# Patient Record
Sex: Female | Born: 1935
Health system: Southern US, Community
[De-identification: ages and names within clinical notes are randomized; demographics above are authoritative.]

## PROBLEM LIST (undated history)

## (undated) DIAGNOSIS — E785 Hyperlipidemia, unspecified: Secondary | ICD-10-CM

## (undated) DIAGNOSIS — IMO0002 Reserved for concepts with insufficient information to code with codable children: Secondary | ICD-10-CM

## (undated) DIAGNOSIS — I739 Peripheral vascular disease, unspecified: Secondary | ICD-10-CM

## (undated) DIAGNOSIS — I2510 Atherosclerotic heart disease of native coronary artery without angina pectoris: Secondary | ICD-10-CM

## (undated) DIAGNOSIS — N183 Chronic kidney disease, stage 3 unspecified: Secondary | ICD-10-CM

## (undated) DIAGNOSIS — I779 Disorder of arteries and arterioles, unspecified: Secondary | ICD-10-CM

## (undated) DIAGNOSIS — I10 Essential (primary) hypertension: Secondary | ICD-10-CM

## (undated) DIAGNOSIS — C689 Malignant neoplasm of urinary organ, unspecified: Secondary | ICD-10-CM

## (undated) DIAGNOSIS — N8110 Cystocele, unspecified: Secondary | ICD-10-CM

## (undated) HISTORY — PX: APPENDECTOMY: SHX54

## (undated) HISTORY — DX: Reserved for concepts with insufficient information to code with codable children: IMO0002

## (undated) HISTORY — DX: Cystocele, unspecified: N81.10

## (undated) HISTORY — PX: NEPHRECTOMY: SHX65

## (undated) HISTORY — DX: Hyperlipidemia, unspecified: E78.5

## (undated) HISTORY — PX: CORONARY ANGIOPLASTY WITH STENT PLACEMENT: SHX49

## (undated) HISTORY — DX: Essential (primary) hypertension: I10

## (undated) HISTORY — DX: Atherosclerotic heart disease of native coronary artery without angina pectoris: I25.10

---

## 1998-10-26 ENCOUNTER — Ambulatory Visit (HOSPITAL_BASED_OUTPATIENT_CLINIC_OR_DEPARTMENT_OTHER): Admission: RE | Admit: 1998-10-26 | Discharge: 1998-10-26 | Payer: Self-pay | Admitting: *Deleted

## 1999-05-10 ENCOUNTER — Encounter: Admission: RE | Admit: 1999-05-10 | Discharge: 1999-05-10 | Payer: Self-pay | Admitting: Family Medicine

## 2000-05-29 ENCOUNTER — Encounter: Admission: RE | Admit: 2000-05-29 | Discharge: 2000-05-29 | Payer: Self-pay | Admitting: *Deleted

## 2000-05-29 ENCOUNTER — Encounter: Payer: Self-pay | Admitting: *Deleted

## 2000-08-20 ENCOUNTER — Other Ambulatory Visit: Admission: RE | Admit: 2000-08-20 | Discharge: 2000-08-20 | Payer: Self-pay | Admitting: Family Medicine

## 2001-07-22 ENCOUNTER — Encounter: Admission: RE | Admit: 2001-07-22 | Discharge: 2001-07-22 | Payer: Self-pay | Admitting: *Deleted

## 2001-07-22 ENCOUNTER — Encounter: Payer: Self-pay | Admitting: Family Medicine

## 2001-12-03 ENCOUNTER — Encounter: Payer: Self-pay | Admitting: Emergency Medicine

## 2001-12-03 ENCOUNTER — Inpatient Hospital Stay (HOSPITAL_COMMUNITY): Admission: EM | Admit: 2001-12-03 | Discharge: 2001-12-06 | Payer: Self-pay | Admitting: Emergency Medicine

## 2001-12-05 ENCOUNTER — Encounter: Payer: Self-pay | Admitting: Cardiology

## 2002-01-20 ENCOUNTER — Inpatient Hospital Stay (HOSPITAL_COMMUNITY): Admission: EM | Admit: 2002-01-20 | Discharge: 2002-01-21 | Payer: Self-pay | Admitting: Emergency Medicine

## 2002-01-20 ENCOUNTER — Encounter: Payer: Self-pay | Admitting: Emergency Medicine

## 2002-02-25 ENCOUNTER — Encounter: Payer: Self-pay | Admitting: Emergency Medicine

## 2002-02-25 ENCOUNTER — Emergency Department (HOSPITAL_COMMUNITY): Admission: EM | Admit: 2002-02-25 | Discharge: 2002-02-25 | Payer: Self-pay | Admitting: Emergency Medicine

## 2002-08-05 ENCOUNTER — Encounter: Payer: Self-pay | Admitting: Family Medicine

## 2002-08-05 ENCOUNTER — Encounter: Admission: RE | Admit: 2002-08-05 | Discharge: 2002-08-05 | Payer: Self-pay | Admitting: Family Medicine

## 2002-12-29 ENCOUNTER — Encounter: Payer: Self-pay | Admitting: Internal Medicine

## 2002-12-29 ENCOUNTER — Inpatient Hospital Stay (HOSPITAL_COMMUNITY): Admission: EM | Admit: 2002-12-29 | Discharge: 2002-12-30 | Payer: Self-pay | Admitting: Emergency Medicine

## 2002-12-29 ENCOUNTER — Encounter: Payer: Self-pay | Admitting: Emergency Medicine

## 2002-12-30 ENCOUNTER — Encounter: Payer: Self-pay | Admitting: Internal Medicine

## 2003-09-28 ENCOUNTER — Encounter: Admission: RE | Admit: 2003-09-28 | Discharge: 2003-09-28 | Payer: Self-pay | Admitting: Family Medicine

## 2004-02-10 ENCOUNTER — Ambulatory Visit: Payer: Self-pay | Admitting: *Deleted

## 2004-02-29 ENCOUNTER — Ambulatory Visit: Payer: Self-pay | Admitting: *Deleted

## 2004-03-31 ENCOUNTER — Ambulatory Visit: Payer: Self-pay | Admitting: *Deleted

## 2004-07-27 ENCOUNTER — Ambulatory Visit: Payer: Self-pay | Admitting: *Deleted

## 2004-08-08 ENCOUNTER — Ambulatory Visit: Payer: Self-pay | Admitting: *Deleted

## 2004-10-12 ENCOUNTER — Encounter: Admission: RE | Admit: 2004-10-12 | Discharge: 2004-10-12 | Payer: Self-pay | Admitting: Family Medicine

## 2004-11-29 ENCOUNTER — Ambulatory Visit: Payer: Self-pay | Admitting: *Deleted

## 2004-12-16 ENCOUNTER — Ambulatory Visit: Payer: Self-pay | Admitting: *Deleted

## 2004-12-16 ENCOUNTER — Ambulatory Visit: Payer: Self-pay

## 2005-04-06 ENCOUNTER — Ambulatory Visit: Payer: Self-pay | Admitting: *Deleted

## 2005-08-02 ENCOUNTER — Ambulatory Visit (HOSPITAL_COMMUNITY): Admission: RE | Admit: 2005-08-02 | Discharge: 2005-08-02 | Payer: Self-pay | Admitting: Internal Medicine

## 2005-08-08 ENCOUNTER — Ambulatory Visit: Payer: Self-pay | Admitting: *Deleted

## 2005-08-09 ENCOUNTER — Ambulatory Visit (HOSPITAL_COMMUNITY): Admission: RE | Admit: 2005-08-09 | Discharge: 2005-08-09 | Payer: Self-pay | Admitting: Urology

## 2005-08-11 ENCOUNTER — Ambulatory Visit: Payer: Self-pay | Admitting: Cardiology

## 2005-08-17 ENCOUNTER — Inpatient Hospital Stay (HOSPITAL_COMMUNITY): Admission: RE | Admit: 2005-08-17 | Discharge: 2005-08-24 | Payer: Self-pay | Admitting: Urology

## 2005-08-17 ENCOUNTER — Encounter (INDEPENDENT_AMBULATORY_CARE_PROVIDER_SITE_OTHER): Payer: Self-pay | Admitting: Specialist

## 2005-09-11 ENCOUNTER — Encounter (HOSPITAL_COMMUNITY): Admission: RE | Admit: 2005-09-11 | Discharge: 2005-10-11 | Payer: Self-pay | Admitting: Oncology

## 2005-09-11 ENCOUNTER — Ambulatory Visit (HOSPITAL_COMMUNITY): Payer: Self-pay | Admitting: Oncology

## 2005-09-11 ENCOUNTER — Encounter: Admission: RE | Admit: 2005-09-11 | Discharge: 2005-09-11 | Payer: Self-pay | Admitting: Oncology

## 2005-10-19 ENCOUNTER — Encounter: Admission: RE | Admit: 2005-10-19 | Discharge: 2005-10-19 | Payer: Self-pay | Admitting: Internal Medicine

## 2005-11-16 ENCOUNTER — Ambulatory Visit (HOSPITAL_COMMUNITY): Admission: RE | Admit: 2005-11-16 | Discharge: 2005-11-16 | Payer: Self-pay | Admitting: Urology

## 2005-12-27 ENCOUNTER — Ambulatory Visit: Payer: Self-pay

## 2005-12-27 ENCOUNTER — Ambulatory Visit: Payer: Self-pay | Admitting: *Deleted

## 2006-01-18 ENCOUNTER — Ambulatory Visit: Payer: Self-pay | Admitting: *Deleted

## 2006-02-12 ENCOUNTER — Ambulatory Visit (HOSPITAL_COMMUNITY): Admission: RE | Admit: 2006-02-12 | Discharge: 2006-02-12 | Payer: Self-pay | Admitting: Urology

## 2006-03-14 ENCOUNTER — Encounter (HOSPITAL_COMMUNITY): Admission: RE | Admit: 2006-03-14 | Discharge: 2006-04-09 | Payer: Self-pay | Admitting: Oncology

## 2006-03-14 ENCOUNTER — Ambulatory Visit (HOSPITAL_COMMUNITY): Payer: Self-pay | Admitting: Oncology

## 2006-05-21 ENCOUNTER — Ambulatory Visit (HOSPITAL_COMMUNITY): Admission: RE | Admit: 2006-05-21 | Discharge: 2006-05-21 | Payer: Self-pay | Admitting: Urology

## 2006-06-12 ENCOUNTER — Ambulatory Visit: Payer: Self-pay | Admitting: *Deleted

## 2006-09-17 ENCOUNTER — Ambulatory Visit (HOSPITAL_COMMUNITY): Payer: Self-pay | Admitting: Oncology

## 2006-09-17 ENCOUNTER — Encounter (HOSPITAL_COMMUNITY): Admission: RE | Admit: 2006-09-17 | Discharge: 2006-10-17 | Payer: Self-pay | Admitting: Oncology

## 2006-10-15 ENCOUNTER — Ambulatory Visit: Payer: Self-pay | Admitting: Internal Medicine

## 2006-10-16 ENCOUNTER — Ambulatory Visit: Payer: Self-pay

## 2006-10-22 ENCOUNTER — Encounter: Admission: RE | Admit: 2006-10-22 | Discharge: 2006-10-22 | Payer: Self-pay | Admitting: Internal Medicine

## 2006-11-20 ENCOUNTER — Ambulatory Visit (HOSPITAL_COMMUNITY): Admission: RE | Admit: 2006-11-20 | Discharge: 2006-11-20 | Payer: Self-pay | Admitting: Urology

## 2006-12-28 ENCOUNTER — Ambulatory Visit (HOSPITAL_COMMUNITY): Admission: RE | Admit: 2006-12-28 | Discharge: 2006-12-28 | Payer: Self-pay | Admitting: Family Medicine

## 2007-03-11 ENCOUNTER — Encounter (HOSPITAL_COMMUNITY): Admission: RE | Admit: 2007-03-11 | Discharge: 2007-04-10 | Payer: Self-pay | Admitting: Oncology

## 2007-03-18 ENCOUNTER — Ambulatory Visit (HOSPITAL_COMMUNITY): Payer: Self-pay | Admitting: Oncology

## 2007-04-01 ENCOUNTER — Ambulatory Visit: Payer: Self-pay | Admitting: Internal Medicine

## 2007-05-30 ENCOUNTER — Ambulatory Visit (HOSPITAL_COMMUNITY): Admission: RE | Admit: 2007-05-30 | Discharge: 2007-05-30 | Payer: Self-pay | Admitting: Urology

## 2007-11-04 ENCOUNTER — Encounter: Admission: RE | Admit: 2007-11-04 | Discharge: 2007-11-04 | Payer: Self-pay | Admitting: Internal Medicine

## 2007-11-18 ENCOUNTER — Encounter (HOSPITAL_COMMUNITY): Admission: RE | Admit: 2007-11-18 | Discharge: 2007-12-18 | Payer: Self-pay | Admitting: Oncology

## 2007-11-18 ENCOUNTER — Ambulatory Visit (HOSPITAL_COMMUNITY): Payer: Self-pay | Admitting: Oncology

## 2007-11-25 ENCOUNTER — Ambulatory Visit (HOSPITAL_COMMUNITY): Admission: RE | Admit: 2007-11-25 | Discharge: 2007-11-25 | Payer: Self-pay | Admitting: Urology

## 2007-12-26 ENCOUNTER — Ambulatory Visit: Payer: Self-pay | Admitting: Internal Medicine

## 2008-01-02 ENCOUNTER — Encounter: Payer: Self-pay | Admitting: Orthopedic Surgery

## 2008-01-02 ENCOUNTER — Emergency Department (HOSPITAL_COMMUNITY): Admission: EM | Admit: 2008-01-02 | Discharge: 2008-01-02 | Payer: Self-pay | Admitting: Emergency Medicine

## 2008-01-06 ENCOUNTER — Ambulatory Visit: Payer: Self-pay | Admitting: Orthopedic Surgery

## 2008-01-06 DIAGNOSIS — S8263XA Displaced fracture of lateral malleolus of unspecified fibula, initial encounter for closed fracture: Secondary | ICD-10-CM

## 2008-01-20 ENCOUNTER — Ambulatory Visit: Payer: Self-pay | Admitting: Orthopedic Surgery

## 2008-02-19 ENCOUNTER — Ambulatory Visit: Payer: Self-pay | Admitting: Orthopedic Surgery

## 2008-03-02 ENCOUNTER — Ambulatory Visit: Payer: Self-pay | Admitting: Internal Medicine

## 2008-03-02 LAB — CONVERTED CEMR LAB
Basophils Relative: 0.8 % (ref 0.0–3.0)
Eosinophils Absolute: 0.2 10*3/uL (ref 0.0–0.7)
Eosinophils Relative: 2.3 % (ref 0.0–5.0)
HCT: 35.9 % — ABNORMAL LOW (ref 36.0–46.0)
MCHC: 33.7 g/dL (ref 30.0–36.0)
MCV: 99.5 fL (ref 78.0–100.0)
Monocytes Absolute: 1.1 10*3/uL — ABNORMAL HIGH (ref 0.1–1.0)
Platelets: 162 10*3/uL (ref 150–400)
RBC: 3.61 M/uL — ABNORMAL LOW (ref 3.87–5.11)
WBC: 10.4 10*3/uL (ref 4.5–10.5)

## 2008-03-10 ENCOUNTER — Ambulatory Visit (HOSPITAL_COMMUNITY): Admission: RE | Admit: 2008-03-10 | Discharge: 2008-03-10 | Payer: Self-pay | Admitting: Family Medicine

## 2008-03-21 ENCOUNTER — Emergency Department (HOSPITAL_COMMUNITY): Admission: EM | Admit: 2008-03-21 | Discharge: 2008-03-21 | Payer: Self-pay | Admitting: Emergency Medicine

## 2008-05-25 ENCOUNTER — Ambulatory Visit (HOSPITAL_COMMUNITY): Payer: Self-pay | Admitting: Oncology

## 2008-05-27 ENCOUNTER — Ambulatory Visit (HOSPITAL_COMMUNITY): Admission: RE | Admit: 2008-05-27 | Discharge: 2008-05-27 | Payer: Self-pay | Admitting: Urology

## 2008-06-03 ENCOUNTER — Ambulatory Visit: Payer: Self-pay | Admitting: Cardiology

## 2008-10-02 ENCOUNTER — Ambulatory Visit: Payer: Self-pay | Admitting: Internal Medicine

## 2008-10-02 DIAGNOSIS — I10 Essential (primary) hypertension: Secondary | ICD-10-CM

## 2008-10-02 DIAGNOSIS — I2510 Atherosclerotic heart disease of native coronary artery without angina pectoris: Secondary | ICD-10-CM

## 2008-10-02 DIAGNOSIS — E785 Hyperlipidemia, unspecified: Secondary | ICD-10-CM

## 2008-11-10 ENCOUNTER — Encounter: Admission: RE | Admit: 2008-11-10 | Discharge: 2008-11-10 | Payer: Self-pay | Admitting: Family Medicine

## 2008-11-25 ENCOUNTER — Encounter: Payer: Self-pay | Admitting: Internal Medicine

## 2008-11-25 ENCOUNTER — Ambulatory Visit (HOSPITAL_COMMUNITY): Admission: RE | Admit: 2008-11-25 | Discharge: 2008-11-25 | Payer: Self-pay | Admitting: Urology

## 2008-11-30 ENCOUNTER — Ambulatory Visit (HOSPITAL_COMMUNITY): Payer: Self-pay | Admitting: Oncology

## 2008-11-30 ENCOUNTER — Encounter (HOSPITAL_COMMUNITY): Admission: RE | Admit: 2008-11-30 | Discharge: 2008-12-30 | Payer: Self-pay | Admitting: Oncology

## 2008-11-30 ENCOUNTER — Encounter: Payer: Self-pay | Admitting: Internal Medicine

## 2008-12-01 ENCOUNTER — Encounter: Payer: Self-pay | Admitting: Internal Medicine

## 2008-12-04 LAB — CONVERTED CEMR LAB
AST: 24 units/L (ref 0–37)
Glucose, Bld: 100 mg/dL — ABNORMAL HIGH (ref 70–99)
LDL Cholesterol: 51 mg/dL (ref 0–99)

## 2008-12-11 ENCOUNTER — Encounter: Payer: Self-pay | Admitting: Internal Medicine

## 2008-12-21 ENCOUNTER — Telehealth: Payer: Self-pay | Admitting: Internal Medicine

## 2009-04-15 ENCOUNTER — Ambulatory Visit: Payer: Self-pay | Admitting: Internal Medicine

## 2009-06-08 ENCOUNTER — Encounter: Payer: Self-pay | Admitting: Internal Medicine

## 2009-06-08 DIAGNOSIS — I779 Disorder of arteries and arterioles, unspecified: Secondary | ICD-10-CM | POA: Insufficient documentation

## 2009-06-08 DIAGNOSIS — I739 Peripheral vascular disease, unspecified: Secondary | ICD-10-CM

## 2009-06-09 ENCOUNTER — Encounter: Payer: Self-pay | Admitting: Internal Medicine

## 2009-06-09 ENCOUNTER — Ambulatory Visit: Payer: Self-pay

## 2009-06-11 ENCOUNTER — Encounter: Payer: Self-pay | Admitting: Internal Medicine

## 2009-06-15 ENCOUNTER — Telehealth: Payer: Self-pay | Admitting: Internal Medicine

## 2009-06-16 ENCOUNTER — Ambulatory Visit: Payer: Self-pay | Admitting: Cardiovascular Disease

## 2009-06-21 ENCOUNTER — Telehealth (INDEPENDENT_AMBULATORY_CARE_PROVIDER_SITE_OTHER): Payer: Self-pay | Admitting: *Deleted

## 2009-06-22 ENCOUNTER — Encounter (HOSPITAL_COMMUNITY): Admission: RE | Admit: 2009-06-22 | Discharge: 2009-08-11 | Payer: Self-pay | Admitting: Internal Medicine

## 2009-06-22 ENCOUNTER — Ambulatory Visit: Payer: Self-pay | Admitting: Cardiology

## 2009-06-22 ENCOUNTER — Ambulatory Visit: Payer: Self-pay

## 2009-07-08 ENCOUNTER — Ambulatory Visit: Payer: Self-pay | Admitting: Internal Medicine

## 2009-08-18 ENCOUNTER — Ambulatory Visit (HOSPITAL_COMMUNITY): Payer: Self-pay | Admitting: Internal Medicine

## 2009-08-18 ENCOUNTER — Encounter (HOSPITAL_COMMUNITY): Admission: RE | Admit: 2009-08-18 | Discharge: 2009-09-17 | Payer: Self-pay | Admitting: Oncology

## 2009-08-18 ENCOUNTER — Encounter: Payer: Self-pay | Admitting: Internal Medicine

## 2009-11-11 ENCOUNTER — Encounter: Admission: RE | Admit: 2009-11-11 | Discharge: 2009-11-11 | Payer: Self-pay | Admitting: Family Medicine

## 2009-12-31 ENCOUNTER — Ambulatory Visit: Payer: Self-pay | Admitting: Internal Medicine

## 2010-01-21 ENCOUNTER — Encounter: Payer: Self-pay | Admitting: Internal Medicine

## 2010-02-18 ENCOUNTER — Encounter (HOSPITAL_COMMUNITY)
Admission: RE | Admit: 2010-02-18 | Discharge: 2010-03-20 | Payer: Self-pay | Source: Home / Self Care | Attending: Oncology | Admitting: Oncology

## 2010-05-10 NOTE — Progress Notes (Signed)
Summary: chestpain   Phone Note Call from Patient Call back at Home Phone [phone]   Caller: Daughter-althea cox  Reason for Call: Talk to Nurse Complaint: Chest Pain Details for Reason: Per pt daughter calling, c/o chestpain, ems was at the home. ekg was normal, b/p was high. ems advise pt to call office -set up appt to see pr.  Initial call taken by: Neil Crouch,  June 15, 2009 2:21 PM  Follow-up for Phone Call        spoke with pt dtr, pt had an episode today of discomfort in her chest and trouble swallowing. she called EMS and they did an EKG and everything was fine. her bp was 180/90 and then 158/90. she is having no problems at present. pt thinks it was due to her eating a prok chop and indigestion. this discomfort is not simular to the tightness she had prior to stenting. pt denies SOB. they request the pt be seen Fredia Beets, RN  June 15, 2009 2:53 PM  Follow-up by: Fredia Beets, RN,  June 15, 2009 2:47 PM  Additional Follow-up for Phone Call Additional follow up Details #1::        pt will see dr Johnsie Cancel (d[place]) tomorrow Fredia Beets, RN  June 15, 2009 3:17 PM

## 2010-05-10 NOTE — Assessment & Plan Note (Signed)
Summary: post myoview per last office vist on 06/16/2009/lg  Medications Added LOTREL 5-20 MG CAPS (AMLODIPINE BESY-BENAZEPRIL HCL) 1 cap every day( generic is ok)      Allergies Added: NKDA  Visit Type:  Follow-up Primary Provider:  Everette Rank  CC:  Post- Myoview.  History of Present Illness: Patient is an 77 year old with a history of CAD (s/p PTCA/Stent to the LAD and PTCA to ramus in 2003).  I last saw her in January of this year. In the interval she had an episode of chest pain after eatting pork chops.  She thinks the pieced got stuck.  It passed  but was seen by EMS. She was set up in cardiology and was set up for a myoview.  this showed no ischemia. Since the spell, the patient has had no recurrence.  Breathing is ok.  Current Medications (verified): 1)  Amlodipine Besy-Benazepril Hcl 5-10 Mg Caps (Amlodipine Besy-Benazepril Hcl) .Marland Kitchen.. 1 Tab By Mouth Once Daily 2)  Metoprolol Succinate 50 Mg Xr24h-Tab (Metoprolol Succinate) .... Take One Tablet By Mouth Daily 3)  Hydrochlorothiazide 25 Mg Tabs (Hydrochlorothiazide) .... Tab By Mouth Once Daily 4)  Adult Aspirin Ec Low Strength 81 Mg Tbec (Aspirin) .... Tab By Mouth Once Daily 5)  Allopurinol 100 Mg Tabs (Allopurinol) .Marland Kitchen.. 1 Tab By Mouth Once Daily 6)  Lipitor 10 Mg Tabs (Atorvastatin Calcium) .Marland Kitchen.. 1 Tab By Mouth Once Daily 7)  Evista 60 Mg Tabs (Raloxifene Hcl) .Marland Kitchen.. 1 Tab By Mouth Once Daily 8)  Oystercal-D 500-400 Mg-Unit Tabs (Calcium-Vitamin D) .Marland Kitchen.. 1 Tab By Mouth Once Daily  Allergies (verified): No Known Drug Allergies  Past History:  Past medical, surgical, family and social histories (including risk factors) reviewed, and no changes noted (except as noted below).  Past Medical History: Reviewed history from 10/02/2008 and no changes required. htn gout dyslipidemia CAD  Past Surgical History: Reviewed history from 10/02/2008 and no changes required.  kidney removed  Family History: Reviewed history from  01/06/2008 and no changes required. FH of Cancer:  Family History Coronary Heart Disease female < 48  Social History: Reviewed history from 06/16/2009 and no changes required. Patient is widowed.  Two daughters care for her Still ambulatory and does all ADL's  Vital Signs:  Patient profile:   77 year old female Height:      61 inches Weight:      128 pounds Pulse rate:   68 / minute Pulse rhythm:   regular Resp:     18 per minute BP sitting:   158 / 72  (left arm) Cuff size:   regular  Vitals Entered By: Sidney Ace (July 08, 2009 3:14 PM)  Physical Exam  Additional Exam:  Patient is in NAD at rest HEENT:  Normocephalic, atraumatic. EOMI, PERRLA.  Neck: JVP is normal. No thyromegaly. No bruits.  Lungs: clear to auscultation. No rales no wheezes.  Heart: Regular rate and rhythm. Normal S1, S2. No S3.   No significant murmurs. PMI not displaced.  Abdomen:  Supple, nontender. Normal bowel sounds. No masses. No hepatomegaly.  Extremities:   Good distal pulses throughout. No lower extremity edema.  Musculoskeletal :moving all extremities.  Neuro:   alert and oriented x3.    Impression & Recommendations:  Problem # 1:  CAD, NATIVE VESSEL (ICD-414.01) Recent bout of chest pain.  Myoview negative.  Pain seemed more fro m food being stuck.  will need to be followed.  Problem # 2:  CAROTID ARTERY STENOSIS (ICD-433.10) Moderate  Continue meds  Periodic f/u  Problem # 3:  HYPERTENSION, BENIGN (ICD-401.1) Needs tighter control.  Increase Lotrel to 5/20.  F/u with Dr. Emilee Hero.  Problem # 4:  HYPERLIPIDEMIA-MIXED (P102836.4) Keep on current regimen.  Patient Instructions: 1)  Your physician wants you to follow-up in: 6 months  You will receive a reminder letter in the mail two months in advance. If you don't receive a letter, please call our office to schedule the follow-up appointment. Prescriptions: LOTREL 5-20 MG CAPS (AMLODIPINE BESY-BENAZEPRIL HCL) 1 cap every day(  generic is ok)  #30 x 6   Entered by:   Francetta Found, RN, BSN   Authorized by:   Annye Rusk, MD, [facility]   Signed by:   Francetta Found, RN, BSN on 07/08/2009   Method used:   Electronically to        [facility] (retail)       [address]       [place], [place]  [address]       Ph: [phone]       Fax: [phone]   RxID:   [id number]

## 2010-05-10 NOTE — Progress Notes (Signed)
Summary: Nuclear Pre-Procedure  Phone Note Outgoing Call   Call placed by: Perrin Maltese, EMT-P,  June 21, 2009 2:03 PM Summary of Call: Reviewed information on Myoview Information Sheet (see scanned document for further details).  Spoke with patient.     Nuclear Med Background Indications for Stress Test: Evaluation for Ischemia, Stent Patency, PTCA Patency   History: Angioplasty, Heart Catheterization, Myocardial Infarction, Myocardial Perfusion Study, Stents  History Comments: 08/03 MI NSTEMI - STENTS Heart Cath multi-vessel DZ EF 65-70% LAD 80% RI 70% RCA 90% Angioplasty RI     Nuclear Pre-Procedure Cardiac Risk Factors: Carotid Disease, Family History - CAD, Hypertension, Lipids Height (in): 13  Nuclear Med Study Referring MD:  P.Ross

## 2010-05-10 NOTE — Assessment & Plan Note (Signed)
Summary: 7 month rov/sl      Allergies Added: NKDA  Visit Type:  Follow-up Primary Provider:  Everette Rank  CC:  no cardiac complaints. BP left arm 160/84    right 150/90.Marland Kitchen  History of Present Illness: Patient is a 77 year old with a history of CAD (s/p PTCA/Stent to the LAD and PTCA to ramus in 2003).  I last saw him in June 2010. since seen, she has not taken meds.  Denies chest pain.  No change in breathing.  Current Medications (verified): 1)  Amlodipine Besy-Benazepril Hcl 5-10 Mg Caps (Amlodipine Besy-Benazepril Hcl) .Marland Kitchen.. 1 Tab By Mouth Once Daily 2)  Metoprolol Succinate 50 Mg Xr24h-Tab (Metoprolol Succinate) .... Take One Tablet By Mouth Daily 3)  Hydrochlorothiazide 25 Mg Tabs (Hydrochlorothiazide) .... Tab By Mouth Once Daily 4)  Adult Aspirin Ec Low Strength 81 Mg Tbec (Aspirin) .... Tab By Mouth Once Daily 5)  Allopurinol 100 Mg Tabs (Allopurinol) .Marland Kitchen.. 1 Tab By Mouth Once Daily 6)  Lipitor 10 Mg Tabs (Atorvastatin Calcium) .Marland Kitchen.. 1 Tab By Mouth Once Daily 7)  Evista 60 Mg Tabs (Raloxifene Hcl) .Marland Kitchen.. 1 Tab By Mouth Once Daily 8)  Oystercal-D 500-400 Mg-Unit Tabs (Calcium-Vitamin D) .Marland Kitchen.. 1 Tab By Mouth Once Daily  Allergies (verified): No Known Drug Allergies  Past History:  Past Medical History: Last updated: 10/02/2008 htn gout dyslipidemia CAD  Family History: Last updated: 01/06/2008 FH of Cancer:  Family History Coronary Heart Disease female < 43  Social History: Last updated: 01/06/2008 Patient is widowed.   Review of Systems       All systems reviewed.  Negatvie to the above problem except as noted.  Vital Signs:  Patient profile:   77 year old female Height:      61 inches Weight:      124 pounds Pulse rate:   61 / minute BP sitting:   160 / 84  (left arm) Cuff size:   regular  Vitals Entered By: Lubertha Basque, CNA (April 15, 2009 8:35 AM)  Physical Exam  General:  Well developed, well nourished, in no acute distress. Head:  normocephalic and  atraumatic Neck:  JVP is normal Lungs:  CTA.  No rales or wheezes. Heart:  RRR.  S1, S2.  No S3.  No significant murmurs. Abdomen:  Supple.  nO masses. Extremities:  No significant edema.   EKG  Procedure date:  04/15/2009  Findings:      NSR>  62 bpm.  Impression & Recommendations:  Problem # 1:  CAD, NATIVE VESSEL (ICD-414.01) No signs of active ischemia.  Continue current meds. Her updated medication list for this problem includes:    Amlodipine Besy-benazepril Hcl 5-10 Mg Caps (Amlodipine besy-benazepril hcl) .Marland Kitchen... 1 tab by mouth once daily    Metoprolol Succinate 50 Mg Xr24h-tab (Metoprolol succinate) .Marland Kitchen... Take one tablet by mouth daily    Adult Aspirin Ec Low Strength 81 Mg Tbec (Aspirin) .Marland Kitchen... Tab by mouth once daily  Orders: EKG w/ Interpretation (93000)  Problem # 2:  HYPERTENSION, BENIGN (ICD-401.1) Did not take meds this am.  Continue current regimen. Her updated medication list for this problem includes:    Amlodipine Besy-benazepril Hcl 5-10 Mg Caps (Amlodipine besy-benazepril hcl) .Marland Kitchen... 1 tab by mouth once daily    Metoprolol Succinate 50 Mg Xr24h-tab (Metoprolol succinate) .Marland Kitchen... Take one tablet by mouth daily    Hydrochlorothiazide 25 Mg Tabs (Hydrochlorothiazide) .Marland Kitchen... Tab by mouth once daily    Adult Aspirin Ec Low Strength 81 Mg  Tbec (Aspirin) .Marland Kitchen... Tab by mouth once daily  Problem # 3:  HYPERLIPIDEMIA-MIXED (ICD-272.4) Lipid panel in August was excellent.  Continue; Her updated medication list for this problem includes:    Lipitor 10 Mg Tabs (Atorvastatin calcium) .Marland Kitchen... 1 tab by mouth once daily  Patient Instructions: 1)  Your physician recommends that you schedule a follow-up appointment in: Sept./2011

## 2010-05-10 NOTE — Letter (Signed)
Summary: [place]   Imported By: Marilynne Drivers 10/12/2009 12:54:00  _____________________________________________________________________  External Attachment:    Type:   Image     Comment:   External Document

## 2010-05-10 NOTE — Assessment & Plan Note (Signed)
Summary: rov/add-on chest pain/dm      Allergies Added: NKDA  Primary Provider:  [facility]  CC:  chest pains yesterday. EMS came they did an EKG on her.  History of Present Illness: Patient is a 77 year old with a history of CAD (s/p PTCA/Stent to the LAD and PTCA to ramus in 2003). She just saw Dr Harrington Challenger.  She also has known moderate carotid disease and has a yearly duplex.  Yesterday she was eating a pork chop and had SSCP.  It sounds like the meat got stuck.  She had pain for an hour or two and EMS was called.  Her ECG on site was ok.  She has not had a recurrance.  She does not have a history of dysphagia or motility problems.  There was no associated diaphoresis, palpitations or syncope.    Current Problems (verified): 1)  Carotid Artery Stenosis  (ICD-433.10) 2)  Hypertension, Benign  (ICD-401.1) 3)  Hyperlipidemia-mixed  (ICD-272.4) 4)  Cad, Native Vessel  (ICD-414.01) 5)  Closed Fracture of Lateral Malleolus  (ICD-824.2) 6)  Family History Coronary Heart Disease Female < 55  (ICD-V17.3)  Current Medications (verified): 1)  Amlodipine Besy-Benazepril Hcl 5-10 Mg Caps (Amlodipine Besy-Benazepril Hcl) .Marland Kitchen.. 1 Tab By Mouth Once Daily 2)  Metoprolol Succinate 50 Mg Xr24h-Tab (Metoprolol Succinate) .... Take One Tablet By Mouth Daily 3)  Hydrochlorothiazide 25 Mg Tabs (Hydrochlorothiazide) .... Tab By Mouth Once Daily 4)  Adult Aspirin Ec Low Strength 81 Mg Tbec (Aspirin) .... Tab By Mouth Once Daily 5)  Allopurinol 100 Mg Tabs (Allopurinol) .Marland Kitchen.. 1 Tab By Mouth Once Daily 6)  Lipitor 10 Mg Tabs (Atorvastatin Calcium) .Marland Kitchen.. 1 Tab By Mouth Once Daily 7)  Evista 60 Mg Tabs (Raloxifene Hcl) .Marland Kitchen.. 1 Tab By Mouth Once Daily 8)  Oystercal-D 500-400 Mg-Unit Tabs (Calcium-Vitamin D) .Marland Kitchen.. 1 Tab By Mouth Once Daily  Allergies (verified): No Known Drug Allergies  Past History:  Past Medical History: Last updated: 10/02/2008 htn gout dyslipidemia CAD  Past Surgical History: Last updated:  10/02/2008  kidney removed  Family History: Last updated: 01/06/2008 FH of Cancer:  Family History Coronary Heart Disease female < 55  Social History: Last updated: 06/16/2009 Patient is widowed.  Two daughters care for her Still ambulatory and does all ADL's  Social History: Patient is widowed.  Two daughters care for her Still ambulatory and does all ADL's  Review of Systems       Denies fever, malais, weight loss, blurry vision, decreased visual acuity, cough, sputum, SOB, hemoptysis, pleuritic pain, palpitaitons, heartburn, abdominal pain, melena, lower extremity edema, claudication, or rash.   Vital Signs:  Patient profile:   77 year old female Height:      61 inches Weight:      128 pounds Pulse rate:   77 / minute Resp:     16 per minute BP sitting:   156 / 82  (left arm)  Vitals Entered By: Burnett Kanaris (June 16, 2009 11:03 AM)  Physical Exam  General:  Affect appropriate Healthy:  appears stated age 39: normal Neck supple with no adenopathy JVP normal bilateral  bruits no thyromegaly Lungs clear with no wheezing and good diaphragmatic motion Heart:  S1/S2 no murmur,rub, gallop or click PMI normal Abdomen: benighn, BS positve, no tenderness, no AAA no bruit.  No HSM or HJR Distal pulses intact with no bruits No edema Neuro non-focal Skin warm and dry    Impression & Recommendations:  Problem # 1:  CAROTID ARTERY STENOSIS (ICD-433.10) Stable by recent duplex with no TIA symptoms Continue ASA Her updated medication list for this problem includes:    Adult Aspirin Ec Low Strength 81 Mg Tbec (Aspirin) .Marland Kitchen... Tab by mouth once daily  Orders: Nuclear Stress Test (Nuc Stress Test)  Problem # 2:  HYPERTENSION, BENIGN (ICD-401.1) Well controlled Her updated medication list for this problem includes:    Amlodipine Besy-benazepril Hcl 5-10 Mg Caps (Amlodipine besy-benazepril hcl) .Marland Kitchen... 1 tab by mouth once daily    Metoprolol Succinate 50 Mg  Xr24h-tab (Metoprolol succinate) .Marland Kitchen... Take one tablet by mouth daily    Hydrochlorothiazide 25 Mg Tabs (Hydrochlorothiazide) .Marland Kitchen... Tab by mouth once daily    Adult Aspirin Ec Low Strength 81 Mg Tbec (Aspirin) .Marland Kitchen... Tab by mouth once daily  Problem # 3:  CAD, NATIVE VESSEL (ICD-414.01) SSCP with known CAD likely related to transient dysphagia F/U myovue  ECG in office ok and asymptomatic now Her updated medication list for this problem includes:    Amlodipine Besy-benazepril Hcl 5-10 Mg Caps (Amlodipine besy-benazepril hcl) .Marland Kitchen... 1 tab by mouth once daily    Metoprolol Succinate 50 Mg Xr24h-tab (Metoprolol succinate) .Marland Kitchen... Take one tablet by mouth daily    Adult Aspirin Ec Low Strength 81 Mg Tbec (Aspirin) .Marland Kitchen... Tab by mouth once daily  Patient Instructions: 1)  Your physician recommends that you schedule a follow-up appointment in: DR ROSS, NEXT AVAILABLE 2)  Your physician has requested that you have an [place].  For further information please visit [email or web address].  Please follow instruction sheet, as given.   EKG Report  Procedure date:  06/16/2009  Findings:      SR 83 PAC LAD Nonspecific lateral T wave changes

## 2010-05-10 NOTE — Miscellaneous (Signed)
Summary: Orders Update  Clinical Lists Changes  Problems: Added new problem of CAROTID ARTERY STENOSIS (ICD-433.10) Orders: Added new Test order of Carotid Duplex (Carotid Duplex) - Signed

## 2010-05-10 NOTE — Assessment & Plan Note (Signed)
Summary: Cardiology Nuclear Study  Nuclear Med Background Indications for Stress Test: Evaluation for Ischemia, Stent Patency, PTCA Patency   History: Angioplasty, Heart Catheterization, Myocardial Infarction, Myocardial Perfusion Study, Stents  History Comments: 08/03 MI NSTEMI > Heart Cath multi-vessel DZ, EF 65-70%, LAD 80% RI 70% RCA 90% Angioplasty RI, stent LAD. 5/07 MPS: (-) ischemia, EF=84%.  Symptoms: Chest Pain    Nuclear Pre-Procedure Cardiac Risk Factors: Carotid Disease, Family History - CAD, Hypertension, Lipids Caffeine/Decaff Intake: None NPO After: 7:30 PM Lungs: clear IV 0.9% NS with Angio Cath: 22g     IV Site: (R) AC IV Started by: Irven [place] RN Chest Size (in) 38     Cup Size D     Height (in): 61 Weight (lb): 124 BMI: 23.51  Nuclear Med Study 1 or 2 day study:  1 day     Stress Test Type:  Carlton Adam Reading MD:  Loralie Champagne, MD     Referring MD:  P.Ross Resting Radionuclide:  Technetium 36m Tetrofosmin     Resting Radionuclide Dose:  11.0 mCi  Stress Radionuclide:  Technetium 47m Tetrofosmin     Stress Radionuclide Dose:  33.0 mCi   Stress Protocol   Lexiscan: 0.4 mg   Stress Test Technologist:  Perrin Maltese EMT-P     Nuclear Technologist:  Charlton Amor CNMT  Rest Procedure  Myocardial perfusion imaging was performed at rest 45 minutes following the intravenous administration of Myoview Technetium 98m Tetrofosmin.  Stress Procedure  The patient received IV Lexiscan 0.4 mg over 15-seconds.  Myoview injected at 30-seconds.  There were no significant changes with infusion.  Quantitative spect images were obtained after a 45 minute delay.  QPS Raw Data Images:  Normal; no motion artifact; normal heart/lung ratio. Stress Images:  NI: Uniform and normal uptake of tracer in all myocardial segments. Rest Images:  Normal homogeneous uptake in all areas of the myocardium. Subtraction (SDS):  There is no evidence of scar or ischemia. Transient  Ischemic Dilatation:  .80  (Normal <1.22)  Lung/Heart Ratio:  .28  (Normal <0.45)  Quantitative Gated Spect Images QGS EDV:  36 ml QGS ESV:  3 ml QGS EF:  83 % QGS cine images:  Normal wall motion.    Overall Impression  Exercise Capacity: [place] study.  BP Response: Normal blood pressure response. Clinical Symptoms: Short of breath.  ECG Impression: Nonspecific T wave changes.  Overall Impression: Normal stress nuclear study.  Appended Document: Cardiology Nuclear Study Normal myoview study.  Appended Document: Cardiology Nuclear Study Patient aware of results....will keep appointment on 3/31 with Dr. Harrington Challenger.

## 2010-05-10 NOTE — Assessment & Plan Note (Signed)
Summary: rov/sl   Visit Type:  Follow-up Primary Coy Vandoren:  Everette Rank  CC:  Right foot pain- edema. some chest pains.  History of Present Illness: Patient is an 77 year old with a history of CAD (s/p PTCA/Stent to the LAD and PTCA to ramus in 2003).  I last saw her in March of this year.  Myoview within past year was neg for ischemia SInce seen she denies chst pain.  No SOB.  Gets around some  Active in house   Frustrated she cant do what she used to outside.  Current Medications (verified): 1)  Metoprolol Succinate 50 Mg Xr24h-Tab (Metoprolol Succinate) .... Take One Tablet By Mouth Daily 2)  Hydrochlorothiazide 25 Mg Tabs (Hydrochlorothiazide) .... Tab By Mouth Once Daily 3)  Adult Aspirin Ec Low Strength 81 Mg Tbec (Aspirin) .... Tab By Mouth Once Daily 4)  Allopurinol 100 Mg Tabs (Allopurinol) .Marland Kitchen.. 1 Tab By Mouth Once Daily 5)  Lipitor 10 Mg Tabs (Atorvastatin Calcium) .Marland Kitchen.. 1 Tab By Mouth Once Daily 6)  Evista 60 Mg Tabs (Raloxifene Hcl) .Marland Kitchen.. 1 Tab By Mouth Once Daily 7)  Oystercal-D 500-400 Mg-Unit Tabs (Calcium-Vitamin D) .Marland Kitchen.. 1 Tab By Mouth Once Daily 8)  Lotrel 5-20 Mg Caps (Amlodipine Besy-Benazepril Hcl) .Marland Kitchen.. 1 Cap Every Day( Generic Is Ok)  Allergies (verified): No Known Drug Allergies  Past History:  Past medical, surgical, family and social histories (including risk factors) reviewed, and no changes noted (except as noted below).  Past Medical History: Reviewed history from 10/02/2008 and no changes required. htn gout dyslipidemia CAD  Past Surgical History: Reviewed history from 10/02/2008 and no changes required.  kidney removed  Family History: Reviewed history from 01/06/2008 and no changes required. FH of Cancer:  Family History Coronary Heart Disease female < 72  Social History: Reviewed history from 06/16/2009 and no changes required. Patient is widowed.  Two daughters care for her Still ambulatory and does all ADL's  Review of Systems       R  metatarsal cramps night  (sharo pain)  Better with walking  Vital Signs:  Patient profile:   77 year old female Height:      61 inches Weight:      129 pounds BMI:     24.46 Pulse rate:   70 / minute Pulse rhythm:   regular Resp:     18 per minute BP sitting:   156 / 70  (left arm) Cuff size:   large  Vitals Entered By: Sidney Ace (December 31, 2009 9:02 AM)  Physical Exam  Additional Exam:  patient in NAD HEENT:  Normocephalic, atraumatic. EOMI, PERRLA.  Neck: JVP is normal. No thyromegaly. No bruits.  Lungs: clear to auscultation. No rales no wheezes.  Heart: Regular rate and rhythm. Normal S1, S2. No S3.   No significant murmurs. PMI not displaced.  Abdomen:  Supple, nontender. Normal bowel sounds. No masses. No hepatomegaly.  Extremities:   Good distal pulses throughout. No lower extremity edema. R foot without swellng Musculoskeletal :moving all extremities.  Neuro:   alert and oriented x3.    EKG  Procedure date:  12/31/2009  Findings:      NSR.  70 bpm.  Impression & Recommendations:  Problem # 1:  CAD, NATIVE VESSEL (ICD-414.01)  Stable.  COntinue meds. Her updated medication list for this problem includes:    Metoprolol Succinate 50 Mg Xr24h-tab (Metoprolol succinate) .Marland Kitchen... Take one tablet by mouth daily    Adult Aspirin Ec Low Strength 81 Mg  Tbec (Aspirin) .Marland Kitchen... Tab by mouth once daily    Lotrel 5-20 Mg Caps (Amlodipine besy-benazepril hcl) .Marland Kitchen... 1 cap every day( generic is ok)  Orders: EKG w/ Interpretation (93000)  Problem # 2:  HYPERTENSION, BENIGN (ICD-401.1) BP is up  She says it always is in doctor's office.  Checks at hom e  Is OK I rec she take cuff to office next time for calibration No changes today. Her updated medication list for this problem includes:    Metoprolol Succinate 50 Mg Xr24h-tab (Metoprolol succinate) .Marland Kitchen... Take one tablet by mouth daily    Hydrochlorothiazide 25 Mg Tabs (Hydrochlorothiazide) .Marland Kitchen... Tab by mouth once  daily    Adult Aspirin Ec Low Strength 81 Mg Tbec (Aspirin) .Marland Kitchen... Tab by mouth once daily    Lotrel 5-20 Mg Caps (Amlodipine besy-benazepril hcl) .Marland Kitchen... 1 cap every day( generic is ok)  Problem # 3:  HYPERLIPIDEMIA-MIXED (ICD-272.4) Will see when lipids checked with Dr. Everette Rank Her updated medication list for this problem includes:    Lipitor 10 Mg Tabs (Atorvastatin calcium) .Marland Kitchen... 1 tab by mouth once daily

## 2010-05-10 NOTE — Letter (Signed)
Summary: [facility] Office Note  [facility] Office Note   Imported By: Sallee Provencal 06/25/2009 10:32:39  _____________________________________________________________________  External Attachment:    Type:   Image     Comment:   External Document

## 2010-06-21 ENCOUNTER — Encounter: Payer: Self-pay | Admitting: Internal Medicine

## 2010-06-28 LAB — COMPREHENSIVE METABOLIC PANEL
ALT: 19 U/L (ref 0–35)
AST: 24 U/L (ref 0–37)
CO2: 27 mEq/L (ref 19–32)
Calcium: 9.9 mg/dL (ref 8.4–10.5)
Chloride: 104 mEq/L (ref 96–112)
GFR calc Af Amer: 39 mL/min — ABNORMAL LOW (ref >60)
GFR calc non Af Amer: 32 mL/min — ABNORMAL LOW (ref >60)
Potassium: 5.4 mEq/L — ABNORMAL HIGH (ref 3.5–5.1)
Sodium: 137 mEq/L (ref 135–145)
Total Bilirubin: 0.5 mg/dL (ref 0.3–1.2)

## 2010-06-28 LAB — CBC
RBC: 3.88 MIL/uL (ref 3.87–5.11)
WBC: 13.1 10*3/uL — ABNORMAL HIGH (ref 4.0–10.5)

## 2010-06-28 NOTE — Miscellaneous (Signed)
  Clinical Lists Changes  Medications: Removed medication of LIPITOR 10 MG TABS (ATORVASTATIN CALCIUM) 1 tab by mouth once daily Added new medication of SIMVASTATIN 20 MG TABS (SIMVASTATIN) 1 tab every evening - Signed Rx of SIMVASTATIN 20 MG TABS (SIMVASTATIN) 1 tab every evening;  #30 x 3;  Signed;  Entered by: Francetta Found, RN, BSN;  Authorized by: Annye Rusk, MD, [facility];  Method used: Electronically to [facility] [address] [place], [place], [place]  [address], Ph: [phone], Fax: [phone]    Prescriptions: SIMVASTATIN 20 MG TABS (SIMVASTATIN) 1 tab every evening  #30 x 3   Entered by:   Francetta Found, RN, BSN   Authorized by:   Annye Rusk, MD, [facility]   Signed by:   Francetta Found, RN, BSN on 06/21/2010   Method used:   Electronically to        [facility] (retail)       [address]       [place],   [address]       Ph: [phone]       Fax: [phone]   RxIDIV:[id number]

## 2010-07-16 LAB — DIFFERENTIAL
Basophils Relative: 1 % (ref 0–1)
Lymphocytes Relative: 32 % (ref 12–46)
Monocytes Absolute: 0.7 10*3/uL (ref 0.1–1.0)
Monocytes Relative: 7 % (ref 3–12)
Neutro Abs: 5.7 10*3/uL (ref 1.7–7.7)

## 2010-07-16 LAB — CBC
HCT: 36.8 % (ref 36.0–46.0)
Platelets: 168 10*3/uL (ref 150–400)
RDW: 15.1 % (ref 11.5–15.5)

## 2010-07-16 LAB — COMPREHENSIVE METABOLIC PANEL
Albumin: 3.4 g/dL — ABNORMAL LOW (ref 3.5–5.2)
Alkaline Phosphatase: 56 U/L (ref 39–117)
BUN: 37 mg/dL — ABNORMAL HIGH (ref 6–23)
Potassium: 4.5 mEq/L (ref 3.5–5.1)
Total Protein: 6.7 g/dL (ref 6.0–8.3)

## 2010-08-18 ENCOUNTER — Ambulatory Visit (HOSPITAL_COMMUNITY): Payer: Medicare Other

## 2010-08-23 NOTE — Assessment & Plan Note (Signed)
[place]                            CARDIOLOGY OFFICE NOTE   CORRIE, BUDINGER                       MRN:          [id number]  DATE:12/26/2007                            DOB:          08-28-1918    IDENTIFICATION:  [name] is a 77 year old woman with a and history  of CAD (status post PTCA stent to the proximal LAD; PTCA of the ramus  back in 2003).  I last saw her in December 2008.  She is also followed  by Dr. Tressie Stalker and Dr. [name] as well as Dr. Everette [place].   Since seen, she stated over the past 2-3 days, she has had a little bit  of discomfort in his chest.  It is not like the angina before her  angioplasty.  She is not sure what it is.  It occurs with and without  activity.  She has not taken anything for it.  She did not sleep well  last night, question because of this.  Denies any pressure at present.   She denies cough.   Her current medicines include aspirin 81, Evista 60, Lipitor 10, Toprol-  XL 50, hydrochlorothiazide 25, Zyloprim 300, amlodipine 5, question 10  daily.   PHYSICAL EXAMINATION:  GENERAL:  The patient is in no distress at rest.  VITAL SIGNS:  Her blood pressure is 150/82, blood pressures at home  though are much better in the 100-130 range, pulse is in the 60s at  home, 76 here.  Weight is 128.  NECK:  JVP is normal.  LUNGS:  Clear without rales or wheezes.  CARDIAC:  Regular rate and rhythm.  S1 and S2.  No S3.  No significant  murmurs.  ABDOMEN:  Benign.  EXTREMITIES:  No edema.   IMPRESSION:  1. Coronary artery disease.  I am not sure what the episode she has      experienced over the past couple of days represents.  I am giving      her a refill on nitroglycerin.  I would like her to call next week      if it continues, but for now, we will keep her on the same medical      therapy.  2. Dyslipidemia, on Lipitor.  Excellent lipid numbers back in March.      She will be due to see Dr. Everette [place] and to have  it drawn.   I will put followup for later next spring.  Again, she is to call with  how she is feeling.   ADDENDUM:  A 12-lead EKG normal sinus rhythm, 77 beats per minute.  Nonspecific ST  changes.     [name], MD, [facility]  Electronically Signed    PVR/MedQ  DD: 12/26/2007  DT: 12/27/2007  Job #: [id number]   cc:   [name], MD  Gaston Islam. Tressie Stalker, MD

## 2010-08-23 NOTE — Assessment & Plan Note (Signed)
[name]                            CARDIOLOGY OFFICE NOTE   Carly, [name]                       MRN:          [id number]  DATE:03/02/2008                            DOB:          05-Apr-1919    IDENTIFICATION:  Carly [name] is a 77 year old woman with a history of  CAD (status post PTCA/stent to the proximal LAD; PTCA of ramus) this was  all done in 2003.  I last saw her in September.   She said since last week about Monday, she began having left-sided pain.  The pain is not brought on by any activity.  Heat makes it feel a little  better, not made worse by coughing.  She denies any injury.  Denies any  radiation.  Denies any associated shortness of breath.   Of note, about 7 weeks ago, the patient broke her left foot and has just  got out of a boot for this.  She has been somewhat down and her  activities has been limited.   CURRENT MEDICATIONS:  1. Aspirin 81.  2. Lotrel 5/10 daily.  3. Evista daily.  4. Lipitor 10.  5. Toprol-XL 50 daily.  6. Hydrochlorothiazide 25.  7. Allopurinol 100.   PHYSICAL EXAMINATION:  GENERAL:  The patient is in no distress at rest.  VITAL SIGNS:  Blood pressure is 154/80 (noted once lower at home), pulse  is 73, and weight is 125 pounds.  NECK:  JVP is normal.  LUNGS:  Clear without rales or wheezes.  CARDIAC:  Regular rate and rhythm, S1 and S2.  No S3.  No murmurs.  ABDOMEN/LEFT RIB AREA:  Nontender.  No masses palpable.  No evidence of  hepatosplenomegaly.  Normal bowel sounds.  Nontender.  BACK:  Buttock area, there was 3 punctate lesions just to the left of  the coccyx.  They are consistent with sebaceous cysts, do not appear to  be infected.  EXTREMITIES:  No trivial lower extremity edema in the left foot.   A 12-lead EKG shows normal sinus rhythm, 73 beats per minute.  Nonspecific ST changes.   IMPRESSION:  1. Left abdominal/chest pain.  I find nothing on physical exam.  At      first, I  was concerned she may be developing shingles.  I am not      convinced of this.  She may have strained something with her foot      being broken that she is unaware of, however, I cannot make it      worse with palpation.  I would follow up for now.  We will check a      CBC.  Continue current medicines.  2. Coronary artery disease.  No evidence that this is representing      angina.  I would continue on current regimen.  3. Dyslipidemia, on Lipitor.  Follow up lipids through Dr. Everette [place].  4. Hypertension.  Blood pressures have run better at home.   I will set to see the patient back in 6-8 months, sooner if problems  develop.   ADDENDUM:  I do  think she would benefit from PT.  She has been down and  her activity has been limited since her foot break.  I think that  physical therapy would help her.     [name], MD, [facility]  Electronically Signed    PVR/MedQ  DD: 03/02/2008  DT: 03/03/2008  Job #: [id number]   cc:   [name], MD  Gaston Islam. Tressie Stalker, MD

## 2010-08-23 NOTE — Assessment & Plan Note (Signed)
[place] CARDIOLOGY OFFICE NOTE   Carly [name], Carly [name]                       MRN:          [id number]  DATE:04/01/2007                            DOB:          15-Jan-1919    IDENTIFICATION:  The patient is an 77 year old woman who was previously  followed by E. Raymond Gurney, MD, [facility].  I did see her in July of this  year.  She is status post non-Q wave MI with PTCA/stent to the proximal  LAD and PTCA of the ramus back in 2003.   Since seen in July she has done about the same.  She does activities as  tolerated, but still says she has to stop to rest.  She does not state a  significant change in this.  She wishes she could do activities like she  could do years ago, but again no acute decline.  Denies chest pain.  No  significant shortness of breath.  Followed by Gaston Islam. Neijstrom, M.D.   MEDICATIONS:  1. Aspirin 81 mg.  2. Lotrel 5/10 daily.  3. Evista 60 mg daily.  4. Lipitor 10 mg.  5. Toprol XL 50 mg.  6. Hydrochlorothiazide 25 mg.  7. Zyloprim 300 mg.   PHYSICAL EXAMINATION:  GENERAL:  The patient is in no distress.  VITAL SIGNS:  Blood pressure 126/80, pulse 74, weight 129 up 4 pounds  from previous.  LUNGS:  Clear.  She is moving air well.  No rales.  HEART:  Regular rate and rhythm, S1 and S2.  No significant murmurs.  ABDOMEN:  Benign.  EXTREMITIES:  No edema.   LABORATORY DATA:  Recently show a BUN and creatinine of 28 and 1.63  which is stable, potassium 4.4.   IMPRESSION:  1. Coronary artery disease.  I think it is stable.  I am not convinced      her fatigability is due to any worsening of her coronary artery      disease.  Note, she had a Myoview scan in 2007 that showed no      significant ischemia and there has been no clinical change since      then.  I do think she may have some deconditioning.  I encouraged      her to walk as she can, kind of push her endurance level.  I would  not change her medicines.  2. Dyslipidemia.  We will need to set follow-up lipids in March.      Otherwise, I will set follow-up      for August.  Note, the patient had lower extremity Dopplers that      showed no significant disease.  Carotid Dopplers were done in      September of 2007 that showed mild plaquing.     [name], MD, [facility]  Electronically Signed    PVR/MedQ  DD: 04/01/2007  DT: 04/02/2007  Job #: [id number]   cc:   [name], MD  Gaston Islam. Tressie Stalker, MD

## 2010-08-23 NOTE — Assessment & Plan Note (Signed)
[place] CARDIOLOGY OFFICE NOTE   Carly [name], Carly [name]                       MRN:          [id number]  DATE:10/15/2006                            DOB:          [birth year]    IDENTIFICATION:  Ms. Coryell is an 77 year old woman who was previously  followed by [name]. She is status post non-Q-wave MI in February  2008, underwent PTCA/stent of the proximal LAD and PTCA of the ramus.  Last seen in clinic back in March.   In the interval, she has done well. She denies chest pain. Breathing is  okay. Her daughter says she is up and around the house. Does note some  cramping at night mainly in her legs.   CURRENT MEDICATIONS:  Include:  1. Aspirin 81.  2. Lotrel 5/10 daily.  3. Evista 60.  4. Lipitor 10.  5. Toprol-XL 50.  6. Hydrochlorothiazide 25.  7. Zyloprim 300.   PHYSICAL EXAMINATION:  The patient is in no distress. Blood pressure  132/76, pulse 66, weight 125.  LUNGS:  Are clear.  NECK:  JVP is normal.  CARDIAC EXAM:  Regular rate and rhythm. S1 and S2. No S3. No murmurs.  ABDOMEN:  Benign.  EXTREMITIES:  Decreased posterior tibial dorsalis pedis pulses. No lower  extremity edema. Feet warm.   IMPRESSION:  1. Coronary artery disease. Clinically stable. Would continue and have      given her refill for nitroglycerin.  2. Dyslipidemia. Total cholesterol back in March was 131 with a LDL of      50, HDL of 60, excellent.  3. Health-care maintenance. With cramping, I would schedule lower      extremity Dopplers. Note also she has mild carotid disease on      previous scan. I will be in touch with her once I see the test      results.   Otherwise, I will set to see her in the fall, sooner if problems  develop.     [name], MD, [facility]  Electronically Signed    PVR/MedQ  DD: 10/15/2006  DT: 10/16/2006  Job #: [id number]   cc:   Wende Neighbors, M.D.  Irine Seal, M.D.

## 2010-08-26 NOTE — Procedures (Signed)
[facility]  Patient:    Carly [name], Carly [name] Visit Number: [id number] MRN: [id number]          Service Type: Attending:  Sinda Du, M.D. Dictated by:   Sinda Du, M.D.               Marjean Donna, M.D.   Stress Test  Ms. Renda came to the stress laboratory for evaluation of chest discomfort and a sensation of being tired in her chest.  She does have a history of hypertension.  Her blood pressure when she came in was 200/100, then 176/98, then 180/104, then 180/98.  These blood pressures are too high to do the test safely.  She is going to be rescheduled.  She may need a change in her blood pressure medication.  She is to discuss that with Dr. Everette Rank and I would suggest that we reschedule her for a Cardiolite stress test instead of a non-nuclear stress test. Dictated by:   Sinda Du, M.D. Attending:  Sinda Du, M.D. DD:  08/08/01 TD:  08/09/01 Job: [id number]

## 2010-08-26 NOTE — Cardiovascular Report (Signed)
NAME:  Carly [name], HEIDEN NO.:  [id number]   MEDICAL RECORD NO.:  [id number]                   PATIENT TYPE:  INP   LOCATION:  2002                                 FACILITY:  [facility]   PHYSICIAN:  Satira Sark, M.D. [facility]        DATE OF BIRTH:  [birth year]   DATE OF PROCEDURE:  DATE OF DISCHARGE:                              CARDIAC CATHETERIZATION   PRIMARY CARE PHYSICIAN:  [name], M.D.   CARDIOLOGIST:  Loretha Brasil. Lia Foyer, M.D.   INDICATIONS:  The patient is a is an 77 year old woman with a history of  hypertension and gout who presents with symptoms suggestive of unstable  angina. She was referred for cardiac catheterization to clearly define the  coronary anatomy and left ventricular performance.   PROCEDURES PERFORMED:  1. Left heart catheterization.  2. Selective coronary angiography.  3. Left ventriculography.   ACCESS AND EQUIPMENT:  The area about the right femoral artery was  anesthetized with 1% lidocaine and a 6 French sheath was placed in the right  femoral artery via the modified Seldinger technique. Standard preformed 6  [place] and JR4 catheters were used for selective coronary angiography,  and a 6 French angled pigtail catheter was used for left heart  catheterization and left ventriculography.  All exchanges were made over a  wire.  The patient tolerated the procedure well without immediate  complications.   HEMODYNAMICS:  1. Left ventricle 152/20 mmHg.  2. Aorta 152/69 mmHg.   ANGIOGRAPHIC FINDINGS:  1. Left main coronary artery is free of significant flow-limiting coronary     atherosclerosis.  2. Left anterior descending is a medium caliber vessel with two small     diagonal branches.  There is 30-40% ostial narrowing of the LAD with an     approximately 80% stenosis in the proximal portion of the vessel.  3. There is a ramus intermedius branch that has a 70% stenosis predominately     involving a superior  branch of this vessel.  4. The circumflex coronary artery is a large vessel with a large     trifurcating obtuse marginal branch and  small posterior descending     branch. There is a 20% stenosis in the mid vessel.  5. The right coronary artery is small and essentially nondominant with a     diffuse 90% mid vessel stenosis.   LEFT VENTRICULOGRAPHY:  Left ventriculography performed in the RAO  projection reveals an ejection fraction of approximately 65-70% with no  focal wall motion abnormalities and no significant mitral regurgitation.   DIAGNOSES:  1. Multivessel coronary artery disease as outlined with an 80% proximal left     anterior descending stenosis, 70% small ramus intermedius stenosis, and     diffuse 90% small nondominant right coronary artery stenosis.  2. Left ventricular ejection fraction of 65-70%.   RECOMMENDATIONS:  I have discussed the films with Dr. Lyndel Safe.  Will  plan to  proceed with percutaneous intervention to address the proximal LAD stenosis  and continue medical therapy for the remaining stenoses.  Otherwise,  aggressive risk factor modification.                                                             Satira Sark, M.D. [facility]    SGM/MEDQ  D:  12/05/2001  T:  12/06/2001  Job:  [id number]

## 2010-08-26 NOTE — Op Note (Signed)
[name], [name] NO.:  [id number]   MEDICAL RECORD NO.:  [id number]          PATIENT TYPE:  INP   LOCATION:  1416                         FACILITY:  [facility]   PHYSICIAN:  [name] [name], M.D.    DATE OF BIRTH:  [birth year]   DATE OF PROCEDURE:  08/17/2005  DATE OF DISCHARGE:                                 OPERATIVE REPORT   PROCEDURE:  Right laparoscopic nephrectomy with open distal ureterectomy.   PREOPERATIVE DIAGNOSIS:  Right renal pelvic mass.   POSTOPERATIVE DIAGNOSIS:  Right renal pelvic mass.   SURGEON:  [name] [name], M.D.   ASSISTANT:  Dutch Gray, M.D.  Peterson Lombard, M.D.   ANESTHESIA:  General.   SPECIMEN:  Right kidney and ureter with cuff of bladder.   DRAINS:  #10 flat Blake drain and 18-French Foley catheter.   COMPLICATIONS:  None.   INDICATIONS:  Carly [name] is an 77 year old white female who presented with  gross hematuria and was found to have a mass in the right renal pelvis  consistent with transitional cell carcinoma.  Cystoscopy revealed no bladder  component and it was felt that nephroureterectomy was indicated.  We have  elected to perform a laparoscopic assisted approach.   FINDINGS AND PROCEDURE:  The patient received IV Ancef.  She was taken  operating room where general anesthetic was induced.  She was fitted with  thigh high TED hose.  She was frog-legged and an 18-French Foley catheter  was inserted using sterile technique.  She was then placed in a modified  flank position supported by the bean bag with the table flexed and all  pressure points appropriately padded.  Her abdomen was shaved.  Her midline  was marked. She was prepped with Betadine solution and draped with sterile  towels followed by an Ioban drape followed by the sterile drapes.   Once appropriately positioned, a Hassan technique laparoscopic port was  placed at the umbilicus.  Then, a 12 mm port was placed in the anterior  axillary line inferolateral to  the umbilicus.  She underwent placement of a  5 mm port in the mid axillary line laterally and a 5 mm port just to the  right of the falciform ligament in the right upper quadrant.  Once all the  ports were placed, the renal dissection was performed.  The colon was  reflected medially. Using the harmonic scalpel, some adhesions from prior  appendectomy were taken down, as well.  The duodenum was then reflected off  the vena cava.  The lower pole of the kidney was elevated allowing  dissection down to the psoas muscle. The ureter was identified and lifted as  part of this packet.  The dissection was then carried into the hilum.  Initially, a renal vein was noted coming off the lateral aspect of the vena  cava.  This was dissected out and controlled with multiple 5 mm Hem-O-Lok  clips.  A lower pole arterial branch was then identified.  This was ligated,  once again, with 5 mm clips and divided.  However, further dissection  revealed  that it branched proximal to the level of division so it was once  again clipped with 10 mm Hem-o-lok clips and divided again.  The main branch  of the renal vein was identified, dissected out using the harmonic scalpel  and a 10 mm right angle dissector. It was then clipped using the gold 12 mm  Hem-O-Lok clips and then divided.  No clips were placed on the caval side.  There was a persistent fullness in the renal side of the vein and further  dissection anteriorly demonstrated a small residual arterial branch. This  was divided between 5 mm clips.  The remainder of the hilar attachments were  taken down using the harmonic scalpel. The upper pole of the kidney was then  dissected within Gerota's sparing the adrenal gland.  The residual lateral  and posterior attachments were then taken down using the harmonic scalpel.  The dissection was then carried down into the pelvis to the level of the  iliac vessels freeing the kidney far enough distally to allow easy  access  through a pelvic incision.   At this point, inspection of the renal fossa revealed no active bleeding.  The laparoscopy trocars were removed.  The 12 mm port was closed with 2-0  Vicryl stitch using the suture passer.  The Hasson port was also closed  using 2-0 Vicryl stitches and a suture passer. Once these ports had been  closed, we then turned our attention to the ureterectomy.   The patient was rolled back from the flank to a more a level position and a  lower midline incision was made with a knife.  This was carried down through  the subcutaneous tissue and fascia with the Bovie.  The peritoneum was  entered and the incision was extended.  The kidney was then brought out  through the wound and the ureter was dissected down to the level of the  detrusor hiatus using Metzenbaum scissors, Bovie, as well as a right angle  clamp. At this point, the bladder was filled from below and an anterior  cystotomy was created.  A sweetheart retractor was used to expose the  trigone. The right ureteral orifice was cannulated with a 5-French feeding  tube.  This was then secured in place using a 2-0 Vicryl suture on a UR-6  needle. With the feeding tube and suture in place, traction was applied to  the ureter which was then circumscribed with a small cuff of bladder and  then dissected away from the bladder wall until completely free from the  detrusor. At this point, the specimen was removed and was handed off the  field.   The detrusor defect in the trigone was closed in two layers with running 2-0  Vicryl suture.  The anterior cystotomy was then closed in two layers using  running 2-0 Vicryl suture.  Inspection revealed no active bleeding. Sponge  counts were correct.  A #10 flat fully fluted Blake drain was brought  through a separate stab wound to the left to the abdominal incision.  The incision was then closed using a running #1 PDS suture.  The Ioban drape was  removed and the port  sites and abdominal incisions were closed using skin  clips.  The drain was secured to the skin with a nylon suture.  The drapes  were removed.  Dressings were applied.  The patient's anesthetic was  reversed and she was removed to recovery room in stable condition.  There  were no  complications during the procedure.  Blood loss was approximately 50  mL.      [name] [name], M.D.  Electronically Signed     JJW/MEDQ  D:  08/17/2005  T:  08/18/2005  Job:  [id number]

## 2010-08-26 NOTE — Consult Note (Signed)
Carly [name], VARGASON NO.:  [id number]   MEDICAL RECORD NO.:  [id number]          PATIENT TYPE:  INP   LOCATION:  [id number]                         FACILITY:  [facility]   PHYSICIAN:  Edythe Lynn, M.D.       DATE OF BIRTH:  February 27, 1919   DATE OF CONSULTATION:  08/18/2005  DATE OF DISCHARGE:                                   CONSULTATION   PRIMARY CARE PHYSICIAN:  [name], M.D.   REQUESTING PHYSICIAN:  Dr. [name].   REASON FOR CONSULTATION:  Hyperglycemia.   HISTORY OF PRESENT ILLNESS:  Carly [name] is an 77 year old woman with a  history of hypertension and coronary artery disease who was admitted on Aug 17, 2005 for an elective right nephroureterectomy.  Postoperatively, it was  noticed that Carly [name] had hyperglycemia, and we were called to  help  with management.  [name] does not have a personal history of diabetes,  but she was told that she may have impaired glucose tolerance.  She has been  following a low carbohydrate diet over the past months.  Currently, Mrs.  [name] denies any chest pain.  Denies any shortness of breath.  Denies any  acute discomfort.   PAST MEDICAL HISTORY:  1.  Coronary artery disease with disease present in all three vessels,      status post PTCA with stent in the intermediate ramus of the LAD, and      also residual 90% RCA stenosis.  Also, the patient has some non-      obstructive stenosis in the other vessels.  2.  Hyperlipidemia.  3.  Hypertension.  4.  Gout.  5.  Status post appendectomy.  6.  Osteoporosis.   MEDICATIONS AT HOME:  1.  Metoprolol 50 mg daily.  2.  Aspirin 81 mg daily.  3.  Evista daily.  4.  Lotrel 5/10 mg daily.  5.  Hydrochlorothiazide 25 mg daily.  6.  Lipitor 10 mg daily.  7.  Allopurinol 300 mg daily.  8.  Calcium and vitamin D daily.   SOCIAL HISTORY:  The patient lives alone.  She is a widow.  She has 2  daughters who live next door.  She does not smoke cigarettes and does not  drink alcohol.   FAMILY HISTORY:  Both parents of the patient are deceased at old age.   REVIEW OF SYSTEMS:  Positive for some abdominal discomfort.  Negative for  nausea, negative for vomiting, negative for dyspnea, negative for diarrhea.  All other systems are negative or per HPI.   PHYSICAL EXAMINATION:  VITAL SIGNS:  Temperature 98.0, heart rate 87,  respirations 16, blood pressure 115/53, saturation 96% on room air.  GENERAL APPEARANCE:  [name] is in no acute distress, alert, oriented  to place, person and time.  She appears well-developed, well-nourished.  HEENT:  Pupils equal, round, reactive to light and accommodation.  Extraocular movements intact.  Throat is clear.  Mouth without ulcerations.  NECK:  Supple without JVD, no carotid bruits.  CHEST:  Clear to auscultation bilaterally without wheezes, rhonchi or  crackles.  HEART:  Regular rate and rhythm without murmurs, rubs or gallops.  ABDOMEN:  Slightly distended but nontender.  She has decreased bowel sounds.  EXTREMITIES:  No edema.  SKIN:  Warm and dry without any suspicious rashes.  NEUROLOGIC:  Cranial nerves III-XII are intact.  Strength is 5/5 in all 4  extremities.   LABORATORY VALUES:  Hemoglobin A1C is 6.1, magnesium 1.5.  Sodium 135,  potassium 5.2, chloride 109, bicarbonate 21, BUN 33, creatinine 1.5, glucose  257.  White blood cell count is 18,000, hemoglobin 9.7 and platelet count is  158.   IMPRESSION/RECOMMENDATIONS:  1.  Impaired glucose tolerance with elevated CBG levels due to postoperative      stress and intravenous D5 infusion.  At this point in time, we recommend      a carbohydrate modified diet when the patient is able to eat.  We      recommend CBG checks before each meal and to start the patient on      sliding scale NovoLog while she is in the house to promote good wound      healing.  I do suspect that at the time of discharge Carly [name]      Carly [name] will be able to be discharged on  diet alone without any need      for oral medications.  2.  Acute blood loss anemia.  Given the patient's history of coronary artery      disease, we will need to be very careful and transfuse this patient if      the hemoglobin drops below 9.  At this point in time, it does appear      that the patient's bleeding has stopped.  3.  Acute renal insufficiency secondary to postoperative stress.      Medications used for anesthesia, as well as acute blood loss.  At this      point in time, I will recommend holding the hydrochlorothiazide and the      benazepril.  Continue with gentle IV fluids and recheck a basic      metabolic profile in the morning.  4.  Leukocytosis, most likely secondary to postoperative stress.  There are      no signs of infection clinically.  Will just follow closely without any      further interventions.  5.  Coronary artery disease.  The patient is currently asymptomatic.  I      would recommend continuing the metoprolol as you are already doing and      starting the aspirin as soon as possible postoperatively.  6.  Mild ileus secondary to the surgical intervention.  I agree with a clear      liquid diet, and I started this patient on some Reglan intravenously.  7.  Hypertension.  Currently the patient is well control on beta-blocker      alone and will only use this medication for now.  8.  History of gout.  This does not seem to be active at this point in time.      Eventually, we will have to resume the patient's allopurinol at a dose      of 100 mg daily.   Thank you very much for your consultation.  Will follow up this patient with  you.      Edythe Lynn, M.D.  Electronically Signed     SL/MEDQ  D:  08/18/2005  T:  08/20/2005  Job:  [id number]   cc:  Dr. Marveen Reeks G. Everette Rank, MD  Fax: [phone]   E. Raymond Gurney, M.D.  [address] [place]  [place] [address]

## 2010-08-26 NOTE — H&P (Signed)
Carly [name], Carly [name] NO.:  [id number]   MEDICAL RECORD NO.:  [id number]          PATIENT TYPE:  INP   LOCATION:  NA                           FACILITY:  [facility]   PHYSICIAN:  Peterson Lombard, MD         DATE OF BIRTH:  11/07/1918   DATE OF ADMISSION:  08/17/2005  DATE OF DISCHARGE:                                HISTORY & PHYSICAL   CHIEF COMPLAINT:  Renal mass.   HISTORY OF PRESENT ILLNESS:  Carly [name] is a pleasant 77 year old female,  evaluated by Dr. [name] for probable right renal transitional cell carcinoma.  She had an episode of hematuria.  She underwent evaluation with a CT scan  which revealed an approximately 3 cm right renal pelvic tumor that enhanced.  She is seen and evaluated by Dr. [name], undergone cystoscopy which was  negative.  She presents to the hospital for right laparoscopic  nephroureterectomy.  She has had no recent fevers or chills.  Otherwise, has  been doing well.   ALLERGIES:  None.   MEDICATIONS:  1.  Aspirin 81 mg daily.  2.  Evista 60 mg daily.  3.  Lotrel 5/10 mg daily.  4.  Toprol XL 50 mg daily.  5.  Allopurinol 300 mg q.h.s.  6.  Lipitor 10 mg q.h.s.  7.  Hydrochlorothiazide 25 mg daily.  8.  Calcium 1000 mg daily.   PAST MEDICAL HISTORY:  1.  Hypertension.  2.  Hyperlipidemia.  3.  Gout.  4.  Coronary artery disease with history of MI and stent.   PAST SURGICAL HISTORY:  1.  Appendectomy.  2.  G2 P2 with vaginal deliveries.   FAMILY HISTORY:  Significant for heart disease, diabetes.   SOCIAL HISTORY:  No alcohol, tobacco or drug use.   REVIEW OF SYSTEMS:  The systems reviewed performed as above with all others  negative.   PHYSICAL EXAMINATION:  VITAL SIGNS:  Afebrile, vital signs stable.  GENERAL APPEARANCE:  Well-developed, well-nourished, no acute distress.  HEENT:  Normocephalic, atraumatic.  NECK:  Supple without lymphadenopathy.  LUNGS:  Clear to auscultation.  HEART:  Regular rate and rhythm.  ABDOMEN:   Soft, nontender nondistended.  GU:  Normal external genitalia.  EXTREMITIES:  No cyanosis, clubbing or edema.  NEUROLOGICAL:  Alert and oriented, grossly intact.  SKIN:  Warm and dry.   ASSESSMENT/PLAN:  An 77 year old female with right renal pelvic tumor,  concerning for transitional cell carcinoma.  The patient will be taken to  the operating room for right laparoscopic nephroureterectomy and will be  admitted to the hospital for postoperative management.           ______________________________  Peterson Lombard, MD     JH/MEDQ  D:  08/17/2005  T:  08/17/2005  Job:  [id number]

## 2010-08-26 NOTE — Discharge Summary (Signed)
NAME:  MARGEAUX, MASSIE              ACCOUNT NO.:  [id number]   MEDICAL RECORD NO.:  [id number]           PATIENT TYPE:   LOCATION:                                 FACILITY:   PHYSICIAN:  [name] [name], M.D.         DATE OF BIRTH:   DATE OF ADMISSION:  08/17/2005  DATE OF DISCHARGE:  08/24/2005                                 DISCHARGE SUMMARY   BRIEF HISTORY:  Carly [name] is an 77 year old white female for evaluation of  hematuria related to probable right renal small carcinoma. Evaluation with  CT scan demonstrates an enhancing 2.8 cm right renal pelvic mass consistent  with transitional cell carcinoma. She is admitted for nephroureterectomy.   ALLERGIES:  Pertinent for no drug allergies.   ADMISSION MEDICATIONS:  1.  Lotrel 5/10 mg daily.  2.  Lipitor 10 mg daily.  3.  Allopurinol 300 mg daily.  4.  Toprol XL 50 mg a day.  5.  Hydrochlorothiazide 25 mg daily.  6.  Evista 60 mg daily.  7.  Baby aspirin.   PAST MEDICAL HISTORY:  Significant for hypertension, hypercholesterolemia,  gout, and coronary artery disease with coronary stents.   PAST SURGICAL HISTORY:  Pertinent for appendectomy. For additional details  of the History and Physical, please see the admission History and Physical.   ACCESSORY CLINICAL INFORMATION/ADMISSION LABORATORIES:  Hemoglobin 12.5,  white count 11.7. Chemistries within normal limits with the exception of BUN  27. Final pathology demonstrated a high grade invasive transitional cell  carcinoma of the right renal pelvis stage T2, NX, MX. Surgical margins were  negative.   HOSPITAL COURSE:  On the day of admission the patient was taken to the  operating room where she underwent a laparoscopic-assisted right  nephrouterectomy. The renal dissection was performed laparoscopically; the  ureteral dissection through a lower midline incision. She tolerated the  procedure well. Postoperatively she was left with a Foley catheter and Blake  drain. Her  postoperative hemoglobin was 10.4, postoperative creatinine 1.1.  She was given a B&O suppository for bladder spasms. On the first  postoperative day she was doing well. JP drainage was 70 cc. Her I&O's were  balanced. Her hemoglobin was 9.7. White count had fallen from 23.8  immediately postoperatively to 18.2. Creatinine had jumped to 1.5. Blood  sugar was 257. Hospitalist consultation was obtained for the hyperglycemia.  She had been in ICU overnight and was transferred to the floor. Dr. Marye Round saw  the patient for hyperglycemia and placed her on modified carbohydrate diet.  Her hemoglobin A1c was only 6.1. Sliding scale insulin was initiated as  well. On the second postoperative day she continued to improve. Her Terrial Rhodes drain was removed. She had good urine output. Creatinine was 1.8.  Hematocrit 27.3. She was tolerating a regular diet. She became tachycardic  and when her hemoglobin of 9 was felt to require transfusion, she was given  1 unit per hospitalist. On the third postoperative day she was not feeling  the best. She was eating a little bit. She was afebrile. Her creatinine  was  down to 1.9 with BUN 24. Her glucose was 106, and hemoglobin was 10.5. She  had a persistent leukocytosis with a white count 18.4. She was changed to  p.o. meds. A urine culture was obtained which was pending. She was given a  Dulcolax suppository. On the fourth postoperative day she was feeling  better, tolerating diet, and had a bowel movement. Her white count had  fallen to 12.8. Hemoglobin was up to 10.7. Her creatinine was stable at 1.9.  She was weaned from her O2. Her Foley catheter was maintained. On the fifth  postoperative day she continued to improve. Her creatinine was down to 1.7.  Chest x-ray revealed some bibasilar atelectasis. A V/Q scan was negative for  PE. This was obtained because of some hypoxia with tachycardia. Her  ambulation was increased. On the sixth postoperative day there  was no  significant change. On the seventh postoperative day her staples were  removed. She was felt to be ready for discharge. Her Foley catheter was  removed.   DISCHARGE DIAGNOSIS:  T2 high grade transitional cell carcinoma of the right  kidney.   SECONDARY DIAGNOSES:  1.  Hypoxia.  2.  Tachycardia.  3.  Postoperative leukocytosis.  4.  Postoperative renal insufficiency.  5.  Hyperglycemia.  6.  Hypertension.  7.  History of coronary artery disease.   She will be discharged home with prescriptions for Vicodin and resumption of  her preop medications which include aspirin 81 mg daily, Evista 60 mg  q.a.m., Lotrel 5/10 p.o. q.a.m., Toprol XL 50 mg q.a.m., allopurinol 300 mg  at bedtime, Lipitor 10 mg at bedtime, hydrochlorothiazide 25 mg q.a.m., and  calcium plus D tabs, two tabs daily.   FOLLOWUP:  Her followup will be scheduled for one week. She will be set up  for a hemoccult evaluation because of her cancer grade and stage.   DISPOSITION:  Home.   CONDITION:  Improved.   PROGNOSIS:  Good.      [name] [name], M.D.  Electronically Signed     JJW/MEDQ  D:  09/25/2005  T:  09/26/2005  Job:  [id number]   cc:   [name], MD  Fax: [phone]   E. Raymond Gurney, M.D.  [address]  [address]  [place]  [place] [address]   Delphina Cahill, M.D.  Fax: [phone]

## 2010-08-26 NOTE — Letter (Signed)
June 12, 2006    Carly [name], M.D.  [address],  [place] [address]   RE:  Carly [name], Carly [name]  MRN:  [id number]  /  DOB:  [birth year]   Dear Carly [name]:   It was a pleasure to this yournice patient, Carly [name], for follow up  on June 12, 2006.  As you know, she is 7 months post right nephrectomy  for high-grade transitional cell carcinoma.   We have seen her for coronary artery disease with a non-Q MI in February  of 2008, at which time she underwent successful percutaneous  transluminal angioplasty and stenting of the proximal LAD and  percutaneous angioplasty of the ramus.  She has remained asymptomatic.  She also has hypertension and hyperlipidemia, both controlled.   MEDICATIONS:  1. Aspirin 81.  2. Lotrel 5/10.  3. Evista.  4. Lipitor 10.  5. Toprol-XL 50.  6. Hydrochlorothiazide 25.  7. Allopurinol.  8. Zyloprim 300.   Recent Carotid Dopplers revealed mild to moderate plaque, 0-39%  bilaterally.   A nuclear study on Aug 11, 2005 was normal.   PHYSICAL EXAMINATION:  VITAL SIGNS:  Blood pressure 143/63, pulse 71,  normal sinus rhythm.  GENERAL APPEARANCE:  Normal.  NECK:  JVP is not elevated.  Carotid pulse pounding without bruit.  LUNGS:  Clear.  CARDIAC:  no murmur or gallop  ABDOMEN:  Normal.  EXTREMITIES:  Normal.   IMPRESSION:  Diagnosis as above.  EKG is within normal limits.   I Suggest  continuing the same therapy and suggest that she see Dr.  Dorris [name] for follow up in the Carly [name] office in 5 months.  Will  plan to get lipids, LFT's and BMP in the interim.   Thank you for the opportunity to see this nice patient.    Sincerely,      E. Raymond Gurney, MD, [facility]  Electronically Signed    EJL/MedQ  DD: 06/12/2006  DT: 06/13/2006  Job #: [id number]

## 2010-08-30 ENCOUNTER — Other Ambulatory Visit (HOSPITAL_COMMUNITY): Payer: Self-pay | Admitting: Oncology

## 2010-08-30 ENCOUNTER — Encounter (HOSPITAL_COMMUNITY): Payer: Medicare Other | Attending: Oncology | Admitting: Oncology

## 2010-08-30 DIAGNOSIS — I10 Essential (primary) hypertension: Secondary | ICD-10-CM | POA: Insufficient documentation

## 2010-08-30 DIAGNOSIS — Z79899 Other long term (current) drug therapy: Secondary | ICD-10-CM | POA: Insufficient documentation

## 2010-08-30 DIAGNOSIS — Z905 Acquired absence of kidney: Secondary | ICD-10-CM | POA: Insufficient documentation

## 2010-08-30 DIAGNOSIS — C669 Malignant neoplasm of unspecified ureter: Secondary | ICD-10-CM

## 2010-08-30 DIAGNOSIS — E785 Hyperlipidemia, unspecified: Secondary | ICD-10-CM | POA: Insufficient documentation

## 2010-08-30 DIAGNOSIS — Z09 Encounter for follow-up examination after completed treatment for conditions other than malignant neoplasm: Secondary | ICD-10-CM | POA: Insufficient documentation

## 2010-08-30 DIAGNOSIS — Z8559 Personal history of malignant neoplasm of other urinary tract organ: Secondary | ICD-10-CM | POA: Insufficient documentation

## 2010-08-30 LAB — COMPREHENSIVE METABOLIC PANEL
Alkaline Phosphatase: 60 U/L (ref 39–117)
BUN: 36 mg/dL — ABNORMAL HIGH (ref 6–23)
Calcium: 10.3 mg/dL (ref 8.4–10.5)
GFR calc non Af Amer: 29 mL/min — ABNORMAL LOW (ref >60)
Glucose, Bld: 103 mg/dL — ABNORMAL HIGH (ref 70–99)
Total Protein: 7.1 g/dL (ref 6.0–8.3)

## 2010-08-30 LAB — DIFFERENTIAL
Basophils Relative: 1 % (ref 0–1)
Eosinophils Absolute: 0.5 10*3/uL (ref 0.0–0.7)
Eosinophils Relative: 5 % (ref 0–5)
Monocytes Absolute: 0.7 10*3/uL (ref 0.1–1.0)
Monocytes Relative: 7 % (ref 3–12)

## 2010-08-30 LAB — CBC
HCT: 39.1 % (ref 36.0–46.0)
Hemoglobin: 12.5 g/dL (ref 12.0–15.0)
MCH: 31.4 pg (ref 26.0–34.0)
MCHC: 32 g/dL (ref 30.0–36.0)
MCV: 98.2 fL (ref 78.0–100.0)

## 2010-09-02 ENCOUNTER — Encounter: Payer: Self-pay | Admitting: Internal Medicine

## 2010-09-09 ENCOUNTER — Ambulatory Visit (INDEPENDENT_AMBULATORY_CARE_PROVIDER_SITE_OTHER): Payer: Medicare Other | Admitting: Urology

## 2010-09-09 DIAGNOSIS — N8111 Cystocele, midline: Secondary | ICD-10-CM

## 2010-09-09 DIAGNOSIS — R82998 Other abnormal findings in urine: Secondary | ICD-10-CM

## 2010-09-09 DIAGNOSIS — N816 Rectocele: Secondary | ICD-10-CM

## 2010-09-12 ENCOUNTER — Encounter: Payer: Self-pay | Admitting: Internal Medicine

## 2010-09-12 ENCOUNTER — Ambulatory Visit (INDEPENDENT_AMBULATORY_CARE_PROVIDER_SITE_OTHER): Payer: Medicare Other | Admitting: Internal Medicine

## 2010-09-12 ENCOUNTER — Other Ambulatory Visit (INDEPENDENT_AMBULATORY_CARE_PROVIDER_SITE_OTHER): Payer: Medicare Other | Admitting: *Deleted

## 2010-09-12 ENCOUNTER — Other Ambulatory Visit: Payer: Self-pay | Admitting: Internal Medicine

## 2010-09-12 DIAGNOSIS — I2510 Atherosclerotic heart disease of native coronary artery without angina pectoris: Secondary | ICD-10-CM

## 2010-09-12 DIAGNOSIS — E785 Hyperlipidemia, unspecified: Secondary | ICD-10-CM

## 2010-09-12 DIAGNOSIS — I10 Essential (primary) hypertension: Secondary | ICD-10-CM

## 2010-09-12 LAB — LIPID PANEL
LDL Cholesterol: 34 mg/dL (ref 0–99)
Total CHOL/HDL Ratio: 2
Triglycerides: 118 mg/dL (ref 0.0–149.0)

## 2010-09-12 LAB — HEPATIC FUNCTION PANEL
ALT: 16 U/L (ref 0–35)
AST: 23 U/L (ref 0–37)
Bilirubin, Direct: 0.1 mg/dL (ref 0.0–0.3)
Total Protein: 6.2 g/dL (ref 6.0–8.3)

## 2010-09-12 NOTE — Patient Instructions (Signed)
Your physician wants you to follow-up in: March 2013. You will receive a reminder letter in the mail two months in advance. If you don't receive a letter, please call our office to schedule the follow-up appointment.

## 2010-09-15 ENCOUNTER — Other Ambulatory Visit: Payer: Self-pay | Admitting: *Deleted

## 2010-09-15 DIAGNOSIS — E785 Hyperlipidemia, unspecified: Secondary | ICD-10-CM

## 2010-09-15 MED ORDER — SIMVASTATIN 10 MG PO TABS: 10.0000 mg | ORAL_TABLET | Freq: Every day | ORAL | Status: DC

## 2010-09-15 NOTE — Progress Notes (Signed)
HPI Patient is an 77 year old with a history of CAD (s/p PTCA/Stent to the LAD and PTCA to ramus in 2003).  I last saw her in  the fall of last year.  Myoview within past year was neg for ischemia SInce seen she has done well.   He denies CP.  Says she is slowing some.  No signif SOB>   No Known Allergies  Current Outpatient Prescriptions  Medication Sig Dispense Refill  . allopurinol (ZYLOPRIM) 100 MG tablet Take 100 mg by mouth daily.        Marland Kitchen amLODipine-benazepril (LOTREL) 5-20 MG per capsule Take 1 capsule by mouth daily.        Marland Kitchen aspirin 81 MG tablet Take 81 mg by mouth daily.        . Calcium Carb-Cholecalciferol (CALCIUM 500 +D) 500-400 MG-UNIT TABS Take by mouth daily.        . hydrochlorothiazide 25 MG tablet Take 25 mg by mouth daily.        . metoprolol (TOPROL-XL) 50 MG 24 hr tablet Take 50 mg by mouth daily.        . raloxifene (EVISTA) 60 MG tablet Take 60 mg by mouth daily.        . simvastatin (ZOCOR) 20 MG tablet Take 20 mg by mouth at bedtime.          Past Medical History  Diagnosis Date  . HTN (hypertension)   . Gout   . Dyslipidemia   . CAD (coronary artery disease)     Past Surgical History  Procedure Date  . Nephrectomy     Family History  Problem Relation Age of Onset  . Cancer    . Heart disease      History   Social History  . Marital Status: Widowed    Spouse Name: N/A    Number of Children: 2  . Years of Education: N/A   Occupational History  . Not on file.   Social History Main Topics  . Smoking status: Never Smoker   . Smokeless tobacco: Not on file  . Alcohol Use: No  . Drug Use: No  . Sexually Active: Not on file   Other Topics Concern  . Not on file   Social History Narrative  . No narrative on file    Review of Systems:  All systems reviewed.  They are negative to the above problem except as previously stated.  Vital Signs: BP 130/70  Pulse 67  Resp 18  Ht 5\' 1"  (1.549 m)  Wt 124 lb (56.246 kg)  BMI 23.43  kg/m2  Physical Exam  Patient is in NAD.  HEENT:  Normocephalic, atraumatic. EOMI, PERRLA.  Neck: JVP is normal. No thyromegaly. No bruits.  Lungs: clear to auscultation. No rales no wheezes.  Heart: Regular rate and rhythm. Normal S1, S2. No S3.   No significant murmurs. PMI not displaced.  Abdomen:  Supple, nontender. Normal bowel sounds. No masses. No hepatomegaly.  Extremities:   Good distal pulses throughout. No lower extremity edema.  Musculoskeletal :moving all extremities.  Neuro:   alert and oriented x3.  CN II-XII grossly intact.  EKG:  NSR with occasional PAC  67 bpm.   Assessment and Plan:

## 2010-09-15 NOTE — Assessment & Plan Note (Signed)
Will need to check fasting lipids.

## 2010-09-15 NOTE — Assessment & Plan Note (Signed)
Doing well.  No symptoms to sugg angina.

## 2010-09-15 NOTE — Assessment & Plan Note (Signed)
Continue meds. 

## 2010-10-19 ENCOUNTER — Other Ambulatory Visit: Payer: Self-pay | Admitting: Internal Medicine

## 2010-12-16 ENCOUNTER — Ambulatory Visit (INDEPENDENT_AMBULATORY_CARE_PROVIDER_SITE_OTHER): Payer: Medicare Other | Admitting: Urology

## 2010-12-16 DIAGNOSIS — Z85528 Personal history of other malignant neoplasm of kidney: Secondary | ICD-10-CM

## 2010-12-16 DIAGNOSIS — R3129 Other microscopic hematuria: Secondary | ICD-10-CM

## 2010-12-16 DIAGNOSIS — N8111 Cystocele, midline: Secondary | ICD-10-CM

## 2011-01-06 LAB — COMPREHENSIVE METABOLIC PANEL
Alkaline Phosphatase: 51
BUN: 37 — ABNORMAL HIGH
Chloride: 106
GFR calc non Af Amer: 25 — ABNORMAL LOW
Glucose, Bld: 102 — ABNORMAL HIGH
Potassium: 4.7
Total Bilirubin: 0.6
Total Protein: 6.8

## 2011-01-06 LAB — CBC
HCT: 37.6
Hemoglobin: 12.3
RDW: 15.1

## 2011-01-13 LAB — BASIC METABOLIC PANEL
Calcium: 9.1 mg/dL (ref 8.4–10.5)
Creatinine, Ser: 1.71 mg/dL — ABNORMAL HIGH (ref 0.4–1.2)
GFR calc non Af Amer: 28 mL/min — ABNORMAL LOW (ref >60)
Glucose, Bld: 96 mg/dL (ref 70–99)
Sodium: 138 mEq/L (ref 135–145)

## 2011-01-13 LAB — DIFFERENTIAL
Basophils Absolute: 0.1 10*3/uL (ref 0.0–0.1)
Basophils Relative: 1 % (ref 0–1)
Lymphocytes Relative: 32 % (ref 12–46)
Neutro Abs: 7 10*3/uL (ref 1.7–7.7)
Neutrophils Relative %: 57 % (ref 43–77)

## 2011-01-13 LAB — URIC ACID: Uric Acid, Serum: 5.3 mg/dL (ref 2.4–7.0)

## 2011-01-13 LAB — LIPASE, BLOOD: Lipase: 44 U/L (ref 11–59)

## 2011-01-13 LAB — CBC
Hemoglobin: 11.7 g/dL — ABNORMAL LOW (ref 12.0–15.0)
Platelets: 183 10*3/uL (ref 150–400)
RDW: 14.9 % (ref 11.5–15.5)

## 2011-01-13 LAB — AMYLASE: Amylase: 132 U/L — ABNORMAL HIGH (ref 27–131)

## 2011-01-16 LAB — COMPREHENSIVE METABOLIC PANEL
AST: 26
Albumin: 3.5
BUN: 28 — ABNORMAL HIGH
Calcium: 9.5
Creatinine, Ser: 1.63 — ABNORMAL HIGH
GFR calc Af Amer: 36 — ABNORMAL LOW
GFR calc non Af Amer: 30 — ABNORMAL LOW
Total Bilirubin: 0.6

## 2011-01-16 LAB — CBC
HCT: 38.5
MCHC: 32.8
MCV: 97.9
Platelets: 179

## 2011-01-16 LAB — BUN: BUN: 34 — ABNORMAL HIGH

## 2011-01-16 LAB — CREATININE, SERUM: GFR calc Af Amer: 34 — ABNORMAL LOW

## 2011-01-26 LAB — COMPREHENSIVE METABOLIC PANEL
ALT: 15
Alkaline Phosphatase: 54
BUN: 29 — ABNORMAL HIGH
CO2: 25
GFR calc non Af Amer: 31 — ABNORMAL LOW
Glucose, Bld: 99
Potassium: 4.8
Sodium: 138
Total Protein: 6.5

## 2011-01-26 LAB — CBC
HCT: 36.8
Hemoglobin: 12.5
MCHC: 34.1
RBC: 3.81 — ABNORMAL LOW
RDW: 14.4 — ABNORMAL HIGH

## 2011-02-17 ENCOUNTER — Other Ambulatory Visit: Payer: Self-pay | Admitting: Internal Medicine

## 2011-02-28 ENCOUNTER — Encounter (HOSPITAL_COMMUNITY): Payer: Medicare Other | Attending: Oncology | Admitting: Oncology

## 2011-02-28 ENCOUNTER — Telehealth (HOSPITAL_COMMUNITY): Payer: Self-pay | Admitting: *Deleted

## 2011-02-28 ENCOUNTER — Encounter (HOSPITAL_COMMUNITY): Payer: Self-pay | Admitting: Oncology

## 2011-02-28 VITALS — BP 156/84 | HR 70 | Temp 97.4°F | Wt 126.2 lb

## 2011-02-28 DIAGNOSIS — C659 Malignant neoplasm of unspecified renal pelvis: Secondary | ICD-10-CM

## 2011-02-28 DIAGNOSIS — Z79899 Other long term (current) drug therapy: Secondary | ICD-10-CM | POA: Insufficient documentation

## 2011-02-28 DIAGNOSIS — N289 Disorder of kidney and ureter, unspecified: Secondary | ICD-10-CM | POA: Insufficient documentation

## 2011-02-28 DIAGNOSIS — E7800 Pure hypercholesterolemia, unspecified: Secondary | ICD-10-CM | POA: Insufficient documentation

## 2011-02-28 DIAGNOSIS — Z8559 Personal history of malignant neoplasm of other urinary tract organ: Secondary | ICD-10-CM | POA: Insufficient documentation

## 2011-02-28 DIAGNOSIS — C649 Malignant neoplasm of unspecified kidney, except renal pelvis: Secondary | ICD-10-CM

## 2011-02-28 DIAGNOSIS — Z09 Encounter for follow-up examination after completed treatment for conditions other than malignant neoplasm: Secondary | ICD-10-CM | POA: Insufficient documentation

## 2011-02-28 DIAGNOSIS — M109 Gout, unspecified: Secondary | ICD-10-CM | POA: Insufficient documentation

## 2011-02-28 LAB — COMPREHENSIVE METABOLIC PANEL
ALT: 14 U/L (ref 0–35)
AST: 24 U/L (ref 0–37)
Albumin: 3.6 g/dL (ref 3.5–5.2)
Alkaline Phosphatase: 59 U/L (ref 39–117)
CO2: 25 mEq/L (ref 19–32)
Chloride: 106 mEq/L (ref 96–112)
GFR calc non Af Amer: 25 mL/min — ABNORMAL LOW (ref >90)
Potassium: 4.6 mEq/L (ref 3.5–5.1)
Sodium: 140 mEq/L (ref 135–145)
Total Bilirubin: 0.3 mg/dL (ref 0.3–1.2)

## 2011-02-28 LAB — CBC
Platelets: 164 10*3/uL (ref 150–400)
RBC: 3.86 MIL/uL — ABNORMAL LOW (ref 3.87–5.11)
RDW: 14.2 % (ref 11.5–15.5)
WBC: 8.8 10*3/uL (ref 4.0–10.5)

## 2011-02-28 NOTE — Progress Notes (Signed)
This office note has been dictated.

## 2011-02-28 NOTE — Patient Instructions (Signed)
Carly [name]  [id number] 1919/04/09   [facility]  Discharge Instructions  RECOMMENDATIONS MADE BY THE CONSULTANT AND ANY TEST RESULTS WILL BE SENT TO YOUR REFERRING DOCTOR.   EXAM FINDINGS BY MD TODAY AND SIGNS AND SYMPTOMS TO REPORT TO CLINIC OR PRIMARY MD: normal.  INSTRUCTIONS GIVEN AND DISCUSSED: Follow-up with Dr. Tressie Stalker in 6 months.  You will receive a call from [place] if your lab work comes back abnormal.  I acknowledge that I have been informed and understand all the instructions given to me and received a copy. I do not have any more questions at this time, but understand that I may call the [facility] at [phone] during business hours should I have any further questions or need assistance in obtaining follow-up care.    __________________________________________  _____________  __________ Signature of Patient or Authorized Representative            Date                   Time    __________________________________________ Nurse's Signature

## 2011-02-28 NOTE — Telephone Encounter (Signed)
Message copied by Berneta Levins on Tue Feb 28, 2011  4:43 PM ------      Message from: Tressie Stalker, ERIC S      Created: Tue Feb 28, 2011  4:34 PM       Labs all staable!      Call her.

## 2011-02-28 NOTE — Progress Notes (Signed)
CC:   [name] [name], M.D. [name], MD Dr Kirstie Peri  DIAGNOSES: 1. Transitional cell carcinoma of the right renal pelvic ureter,     status post right nephroureterectomy on 08/17/2005 for T2 NX MX     lesion with no evidence for recurrent or metastatic disease thus     far.  Her cancer was a high-grade 3.5 cm cancer. 2. Rectocele and cystocele prolapse found by Dr. [name]. 3. Coronary artery disease, stable. 4. Hypercholesterolemia, responding very nicely to simvastatin 20 mg a     day. 5. History of gout, on allopurinol. 6. History of hypertension. 7. Mild renal insufficiency, now of course it is just one kidney.  Carly [name] is 77 years old and looks great.  She still has her wits about her. She is still living on her own, cooking her own meals, doing her own housecleaning.  She remains without fevers, chills, night sweats, weight loss.  She has a good appetite.  No abdominal pain, etc.  PHYSICAL EXAMINATION:  Today, really quite unremarkable.  She is 126 pounds, which is very stable and her blood pressure is 156/84 left arm sitting position, pulse 72 and regular, respirations 16 and unlabored. She is afebrile.  She complains of no pain.  She has no lymphadenopathy in the cervical, supraclavicular, infraclavicular, axillary or inguinal areas.  She has no hepatosplenomegaly.  She has no abdominal distention. No ascites.  She has clear lung fields to auscultation and percussion. Heart shows a regular rhythm and rate.  I did not hear a murmur, rub or gallop.  She has negative breast exam.  No peripheral edema.  She skipped her mammogram this year which I think is fine.  I think she can easily go to just every other year if she so desires.  I think she looks great.  Will get some baseline blood work, maybe just a CBC and CMET but she may have had some of this at Dr. Pearline Cables within the last month so will see if those have already been done and if so we will not do them today, but  I do want to see her in 6 months, and then if she is fine a year from now, we will just see her once a year thereafter for a couple more years but she remains disease free and I see no need to do a CT scan at this time.    ______________________________ Gaston Islam. Tressie Stalker, MD ESN/MEDQ  D:  02/28/2011  T:  02/28/2011  Job:  [id number]

## 2011-02-28 NOTE — Telephone Encounter (Signed)
Spoke with pt as below. Pt pleased that labs are stable.

## 2011-03-15 ENCOUNTER — Other Ambulatory Visit (INDEPENDENT_AMBULATORY_CARE_PROVIDER_SITE_OTHER): Payer: Medicare Other | Admitting: *Deleted

## 2011-03-15 DIAGNOSIS — E785 Hyperlipidemia, unspecified: Secondary | ICD-10-CM

## 2011-03-15 LAB — LIPID PANEL
LDL Cholesterol: 68 mg/dL (ref 0–99)
Total CHOL/HDL Ratio: 3
Triglycerides: 154 mg/dL — ABNORMAL HIGH (ref 0.0–149.0)

## 2011-03-31 ENCOUNTER — Other Ambulatory Visit (HOSPITAL_COMMUNITY): Payer: Self-pay | Admitting: Family Medicine

## 2011-03-31 DIAGNOSIS — E785 Hyperlipidemia, unspecified: Secondary | ICD-10-CM

## 2011-03-31 DIAGNOSIS — I2510 Atherosclerotic heart disease of native coronary artery without angina pectoris: Secondary | ICD-10-CM

## 2011-04-26 ENCOUNTER — Encounter: Payer: Self-pay | Admitting: Internal Medicine

## 2011-04-27 ENCOUNTER — Telehealth: Payer: Self-pay | Admitting: Internal Medicine

## 2011-04-27 NOTE — Telephone Encounter (Signed)
Spoke with Truitt Merle, NP and she states that since pt has had a fall this a.m. With light-headedness and dizziness she should go to ER and get a CT scan of head.  Daughter informed.

## 2011-04-27 NOTE — Telephone Encounter (Signed)
Pt's dtr calling re pt being dizzy, fell this am, tried to take her BP but it wouldn't take, has appt tomorrow, but thinks she needs to be seen today

## 2011-04-28 ENCOUNTER — Ambulatory Visit (INDEPENDENT_AMBULATORY_CARE_PROVIDER_SITE_OTHER): Payer: Medicare Other | Admitting: Internal Medicine

## 2011-04-28 ENCOUNTER — Encounter: Payer: Self-pay | Admitting: Internal Medicine

## 2011-04-28 DIAGNOSIS — E785 Hyperlipidemia, unspecified: Secondary | ICD-10-CM

## 2011-04-28 DIAGNOSIS — I2510 Atherosclerotic heart disease of native coronary artery without angina pectoris: Secondary | ICD-10-CM

## 2011-04-28 DIAGNOSIS — I10 Essential (primary) hypertension: Secondary | ICD-10-CM

## 2011-04-28 DIAGNOSIS — R42 Dizziness and giddiness: Secondary | ICD-10-CM

## 2011-04-28 MED ORDER — NITROGLYCERIN 0.4 MG SL SUBL: 0.4000 mg | SUBLINGUAL_TABLET | SUBLINGUAL | Status: DC | PRN

## 2011-04-28 NOTE — Assessment & Plan Note (Signed)
No symptoms to suggest angina.  Keep active.

## 2011-04-28 NOTE — Assessment & Plan Note (Signed)
Spells are suspicious for being orthostatic in origin as they usually occur in AM She is not orthostatic on exam.   I have asked her to take her toprol at lunch to see if this helps. When she has a spell I have asked her relative, if present, to get a bp.

## 2011-04-28 NOTE — Assessment & Plan Note (Signed)
BP is OK today.  She is not orthostatic.  I have asked her to space her meds a little different  Take Toprol at lunch.  Follow spells.  Take BP duing spell.

## 2011-04-28 NOTE — Progress Notes (Signed)
HPI Patient is a 78 year old with a history of CAD (s/p PTCA/Stent to the LAD and PTCA to ramus in 2003).  I saw her in June of  2012.  She had a myoview prior to that visit that was negative for ischemia. Patient had lipid panel in June that showed LDL was 34.  Backed down on Zocor to 10.  Lipids in Dec LDL wa 68, HDL was 61, Trig were 154.' Yester 8:30  Having breakfast. Felt OK prior.  Got up to get a cooke   Walked to get cookie.  Knew she was going to fall. LIghtheaded.  Fell down holding something.  Nauseated.  Lasted about 5 min.  No syncope Yesterday seen by [facility].  Not orthostatic.  No other findings.  Today feels OK.    Family says she has had quite a few of these dizzy spells.  Usually in AM.  2 or 3 in last 5 months. No palpitations  No HA.  Otherwise denies CP.  Breathing is OK No Known Allergies  Current Outpatient Prescriptions  Medication Sig Dispense Refill  . allopurinol (ZYLOPRIM) 100 MG tablet Take 100 mg by mouth daily.        Marland Kitchen aspirin 81 MG tablet Take 81 mg by mouth daily.        . Calcium Carb-Cholecalciferol (CALCIUM 500 +D) 500-400 MG-UNIT TABS Take by mouth daily.        Marland Kitchen HYDRODIURIL 25 MG tablet TAKE ONE TABLET BY MOUTH ONCE DAILY.  90 each  2  . LOTREL 5-20 MG per capsule TAKE (1) CAPSULE BY MOUTHONCE DAILY.  30 each  11  . meclizine (ANTIVERT) 25 MG tablet as needed.      . metoprolol (TOPROL-XL) 50 MG 24 hr tablet Take 50 mg by mouth daily.        . nitroGLYCERIN (NITROSTAT) 0.4 MG SL tablet Place 1 tablet (0.4 mg total) under the tongue every 5 (five) minutes as needed for chest pain.  25 tablet  3  . raloxifene (EVISTA) 60 MG tablet Take 60 mg by mouth daily.        . simvastatin (ZOCOR) 10 MG tablet Take 20 mg by mouth at bedtime.          Past Medical History  Diagnosis Date  . HTN (hypertension)   . Gout   . Dyslipidemia   . CAD (coronary artery disease)   . Cystocele   . Renal cell cancer   . Renal insufficiency   . Vaginal wall prolapse     . Hypercholesteremia     Past Surgical History  Procedure Date  . Nephrectomy   . Appendectomy     Family History  Problem Relation Age of Onset  . Cancer    . Heart disease      History   Social History  . Marital Status: Widowed    Spouse Name: N/A    Number of Children: 2  . Years of Education: N/A   Occupational History  . Not on file.   Social History Main Topics  . Smoking status: Never Smoker   . Smokeless tobacco: Never Used  . Alcohol Use: No  . Drug Use: No  . Sexually Active: No   Other Topics Concern  . Not on file   Social History Narrative  . No narrative on file    Review of Systems:  All systems reviewed.  They are negative to the above problem except as previously stated.  Vital  Signs: BP 132/80  Pulse 88  Ht 5\' 1"  (1.549 m)  Wt 126 lb (57.153 kg)  BMI 23.81 kg/m2  Physical Exam  Patient is in NAD HEENT:  Normocephalic, atraumatic. EOMI, PERRLA.  Neck: JVP is normal. No thyromegaly. No bruits.  Lungs: clear to auscultation. No rales no wheezes.  Heart: Regular rate and rhythm. Normal S1, S2. No S3.   No significant murmurs. PMI not displaced.  Abdomen:  Supple, nontender. Normal bowel sounds. No masses. No hepatomegaly.  Extremities:   Good distal pulses throughout. No lower extremity edema.  Musculoskeletal :moving all extremities.  Neuro:   alert and oriented x3.  CN II-XII grossly intact.   Assessment and Plan:

## 2011-04-28 NOTE — Assessment & Plan Note (Signed)
Keep on same regimen.

## 2011-04-28 NOTE — Patient Instructions (Signed)
Your physician recommends that you schedule a follow-up appointment in: September

## 2011-05-01 ENCOUNTER — Ambulatory Visit (HOSPITAL_COMMUNITY)
Admission: RE | Admit: 2011-05-01 | Discharge: 2011-05-01 | Disposition: A | Discharge status: Home / Self Care | Payer: Medicare Other | Source: Ambulatory Visit | Attending: Family Medicine | Admitting: Family Medicine

## 2011-05-01 DIAGNOSIS — I2510 Atherosclerotic heart disease of native coronary artery without angina pectoris: Secondary | ICD-10-CM | POA: Insufficient documentation

## 2011-05-01 DIAGNOSIS — Z8559 Personal history of malignant neoplasm of other urinary tract organ: Secondary | ICD-10-CM | POA: Insufficient documentation

## 2011-05-01 DIAGNOSIS — E785 Hyperlipidemia, unspecified: Secondary | ICD-10-CM | POA: Insufficient documentation

## 2011-05-01 DIAGNOSIS — I10 Essential (primary) hypertension: Secondary | ICD-10-CM | POA: Insufficient documentation

## 2011-06-23 ENCOUNTER — Ambulatory Visit (INDEPENDENT_AMBULATORY_CARE_PROVIDER_SITE_OTHER): Payer: Medicare Other | Admitting: Urology

## 2011-06-23 DIAGNOSIS — Z8553 Personal history of malignant neoplasm of renal pelvis: Secondary | ICD-10-CM

## 2011-06-23 DIAGNOSIS — R3129 Other microscopic hematuria: Secondary | ICD-10-CM

## 2011-08-28 ENCOUNTER — Encounter (HOSPITAL_COMMUNITY): Payer: Self-pay | Admitting: Oncology

## 2011-08-28 ENCOUNTER — Encounter (HOSPITAL_COMMUNITY): Payer: Medicare Other | Attending: Oncology | Admitting: Oncology

## 2011-08-28 VITALS — BP 176/91 | HR 76 | Temp 97.2°F | Wt 126.5 lb

## 2011-08-28 DIAGNOSIS — I10 Essential (primary) hypertension: Secondary | ICD-10-CM

## 2011-08-28 DIAGNOSIS — Z8559 Personal history of malignant neoplasm of other urinary tract organ: Secondary | ICD-10-CM

## 2011-08-28 DIAGNOSIS — C651 Malignant neoplasm of right renal pelvis: Secondary | ICD-10-CM

## 2011-08-28 DIAGNOSIS — I2510 Atherosclerotic heart disease of native coronary artery without angina pectoris: Secondary | ICD-10-CM

## 2011-08-28 NOTE — Progress Notes (Signed)
This office note has been dictated.

## 2011-08-28 NOTE — Progress Notes (Signed)
CC:   [name], MD [name] [name], M.D. [name], MD, [facility]  DIAGNOSES: 1. 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  This was a high grade 3.5     cm cancer. 2. History of coronary artery disease with what sounds like a stent     placement years ago. 3. Chronic renal medical disease, stable. 4. Rectocele and cystocele, not in need of repair presently. 5. Hypertension. 6. Hypercholesterolemia. 7. History of gout. Carly [name] is here today with her oldest daughter.  Carly [name] has no fevers, chills, night sweats.  She does not have headaches.  She does not have abdominal pain, shortness of breath, chest pain, change in bowel habits. No blood in her stools.  No blood in her urine.  She has no swelling of her legs.  PHYSICAL EXAMINATION:  Vital signs:  Very stable.  Weight is 126 pounds, same as last year.  Blood pressure is 176/91 which is slightly higher than last year at 160/79.  Pulse though was 76 and regular. Respirations 16 and unlabored.  She is afebrile.  Denies any pain.  She has a skin lesion on the dorsum of the right hand which is very hyperkeratotic and in need of being evaluated by the dermatologist. Will set that appointment up.  She has no adenopathy in the cervical, supraclavicular, infraclavicular, axillary, or inguinal areas.  Lungs: Clear to auscultation and percussion.  Heart:  Regular rhythm and rate without murmur, rub or gallop.  Abdomen:  Soft and nontender without organomegaly.  She has no leg edema.  Breast:  Negative for masses.  She was wondering once again about mammography, and I think every other year is fine with her at this point in time.  She could stop it altogether if she wanted to, and I did mention that to her.  She will see [place] back in 6 months.  If all is well then, will go to once a year for just  probably 2 years and then will release her from this clinic.    ______________________________ Gaston Islam. Tressie Stalker, MD ESN/MEDQ  D:  08/28/2011  T:  08/28/2011  Job:  [id number]

## 2011-08-28 NOTE — Patient Instructions (Signed)
Carly [name]  [id number] 04-18-1918 Dr. Everardo All   [facility]  Discharge Instructions  RECOMMENDATIONS MADE BY THE CONSULTANT AND ANY TEST RESULTS WILL BE SENT TO YOUR REFERRING DOCTOR.   EXAM FINDINGS BY MD TODAY AND SIGNS AND SYMPTOMS TO REPORT TO CLINIC OR PRIMARY MD: Exam and discussion per MD.  You are doing well.  Need to see a skin doctor about the lesion on your hand.  Need to make sure it's not cancer.  MEDICATIONS PRESCRIBED: none   INSTRUCTIONS GIVEN AND DISCUSSED: Other:  Report any new lumps, bone pain or shortness of breath.  SPECIAL INSTRUCTIONS/FOLLOW-UP: Return to Clinic in 6 months and Other (Referral/Appointments):  To see Dr. [place] Crane on 08/31/11 at 12 noon.   I acknowledge that I have been informed and understand all the instructions given to me and received a copy. I do not have any more questions at this time, but understand that I may call the Specialty Clinic at [facility] at [phone] during business hours should I have any further questions or need assistance in obtaining follow-up care.    __________________________________________  _____________  __________ Signature of Patient or Authorized Representative            Date                   Time    __________________________________________ Nurse's Signature

## 2011-09-20 ENCOUNTER — Ambulatory Visit (HOSPITAL_COMMUNITY)
Admission: RE | Admit: 2011-09-20 | Discharge: 2011-09-20 | Disposition: A | Discharge status: Home / Self Care | Payer: Medicare Other | Source: Ambulatory Visit | Attending: Family Medicine | Admitting: Family Medicine

## 2011-09-20 ENCOUNTER — Other Ambulatory Visit (HOSPITAL_COMMUNITY): Payer: Self-pay | Admitting: Family Medicine

## 2011-09-20 DIAGNOSIS — M25569 Pain in unspecified knee: Secondary | ICD-10-CM | POA: Insufficient documentation

## 2011-09-26 ENCOUNTER — Other Ambulatory Visit (HOSPITAL_COMMUNITY): Payer: Self-pay | Admitting: Family Medicine

## 2011-09-26 DIAGNOSIS — M25569 Pain in unspecified knee: Secondary | ICD-10-CM

## 2011-09-29 ENCOUNTER — Other Ambulatory Visit (HOSPITAL_COMMUNITY): Payer: Medicare Other

## 2011-10-02 ENCOUNTER — Other Ambulatory Visit: Payer: Self-pay | Admitting: Internal Medicine

## 2011-11-06 ENCOUNTER — Other Ambulatory Visit: Payer: Self-pay | Admitting: Internal Medicine

## 2011-12-02 ENCOUNTER — Other Ambulatory Visit: Payer: Self-pay | Admitting: Internal Medicine

## 2011-12-20 ENCOUNTER — Other Ambulatory Visit (HOSPITAL_COMMUNITY): Payer: Self-pay | Admitting: Oncology

## 2011-12-20 DIAGNOSIS — Z8739 Personal history of other diseases of the musculoskeletal system and connective tissue: Secondary | ICD-10-CM

## 2011-12-20 MED ORDER — ALLOPURINOL 100 MG PO TABS: 100.0000 mg | ORAL_TABLET | Freq: Every day | ORAL | Status: DC

## 2012-01-31 ENCOUNTER — Telehealth: Payer: Self-pay | Admitting: *Deleted

## 2012-01-31 NOTE — Telephone Encounter (Signed)
Received letter from [place] asking to renew patient's BP medication with a 90day supply instead of a 30day supply. I spoke with the patient and she wants to wait to do this until she sees Dr.Ross at clinic visit on 11/1.

## 2012-02-09 ENCOUNTER — Ambulatory Visit: Payer: Medicare Other | Admitting: Internal Medicine

## 2012-02-15 ENCOUNTER — Other Ambulatory Visit (HOSPITAL_COMMUNITY): Payer: Self-pay | Admitting: Oncology

## 2012-02-15 DIAGNOSIS — Z8739 Personal history of other diseases of the musculoskeletal system and connective tissue: Secondary | ICD-10-CM

## 2012-02-15 MED ORDER — ALLOPURINOL 100 MG PO TABS: 100.0000 mg | ORAL_TABLET | Freq: Every day | ORAL | Status: DC

## 2012-02-27 ENCOUNTER — Encounter (HOSPITAL_COMMUNITY): Payer: Self-pay | Admitting: Oncology

## 2012-02-27 ENCOUNTER — Encounter (HOSPITAL_COMMUNITY): Payer: Medicare Other | Attending: Oncology | Admitting: Oncology

## 2012-02-27 VITALS — BP 178/84 | HR 78 | Temp 98.0°F | Resp 18 | Wt 123.8 lb

## 2012-02-27 DIAGNOSIS — I10 Essential (primary) hypertension: Secondary | ICD-10-CM

## 2012-02-27 DIAGNOSIS — M109 Gout, unspecified: Secondary | ICD-10-CM | POA: Insufficient documentation

## 2012-02-27 DIAGNOSIS — I2510 Atherosclerotic heart disease of native coronary artery without angina pectoris: Secondary | ICD-10-CM | POA: Insufficient documentation

## 2012-02-27 DIAGNOSIS — C659 Malignant neoplasm of unspecified renal pelvis: Secondary | ICD-10-CM

## 2012-02-27 DIAGNOSIS — N289 Disorder of kidney and ureter, unspecified: Secondary | ICD-10-CM

## 2012-02-27 DIAGNOSIS — N8111 Cystocele, midline: Secondary | ICD-10-CM | POA: Insufficient documentation

## 2012-02-27 DIAGNOSIS — N816 Rectocele: Secondary | ICD-10-CM | POA: Insufficient documentation

## 2012-02-27 DIAGNOSIS — Z8559 Personal history of malignant neoplasm of other urinary tract organ: Secondary | ICD-10-CM | POA: Insufficient documentation

## 2012-02-27 DIAGNOSIS — N189 Chronic kidney disease, unspecified: Secondary | ICD-10-CM | POA: Insufficient documentation

## 2012-02-27 DIAGNOSIS — I129 Hypertensive chronic kidney disease with stage 1 through stage 4 chronic kidney disease, or unspecified chronic kidney disease: Secondary | ICD-10-CM | POA: Insufficient documentation

## 2012-02-27 DIAGNOSIS — E7800 Pure hypercholesterolemia, unspecified: Secondary | ICD-10-CM | POA: Insufficient documentation

## 2012-02-27 DIAGNOSIS — Z09 Encounter for follow-up examination after completed treatment for conditions other than malignant neoplasm: Secondary | ICD-10-CM | POA: Insufficient documentation

## 2012-02-27 LAB — COMPREHENSIVE METABOLIC PANEL
AST: 22 U/L (ref 0–37)
Albumin: 3.6 g/dL (ref 3.5–5.2)
Alkaline Phosphatase: 66 U/L (ref 39–117)
BUN: 41 mg/dL — ABNORMAL HIGH (ref 6–23)
CO2: 21 mEq/L (ref 19–32)
Chloride: 108 mEq/L (ref 96–112)
GFR calc non Af Amer: 26 mL/min — ABNORMAL LOW (ref >90)
Potassium: 4.6 mEq/L (ref 3.5–5.1)
Total Bilirubin: 0.2 mg/dL — ABNORMAL LOW (ref 0.3–1.2)

## 2012-02-27 LAB — CBC WITH DIFFERENTIAL/PLATELET
Basophils Absolute: 0.1 10*3/uL (ref 0.0–0.1)
Basophils Relative: 1 % (ref 0–1)
HCT: 38.5 % (ref 36.0–46.0)
Hemoglobin: 12.6 g/dL (ref 12.0–15.0)
Lymphocytes Relative: 30 % (ref 12–46)
MCHC: 32.7 g/dL (ref 30.0–36.0)
Monocytes Relative: 7 % (ref 3–12)
Neutro Abs: 5.6 10*3/uL (ref 1.7–7.7)
Neutrophils Relative %: 56 % (ref 43–77)
WBC: 10.2 10*3/uL (ref 4.0–10.5)

## 2012-02-27 NOTE — Progress Notes (Signed)
Problem #1 transitional cell carcinoma the right pelvic ureter status post right nephroureterectomy on 08/17/2005 for a T2, NX, MX lesion with no evidence for recurrent disease thus far. This was a high-grade 3.5 cm cancer Problem #2 CAD with stent placed years ago Problem #3 hypertension Problem #4 hypercholesterolemia Problem #5 rectocele and cystocele, asymptomatic Problem #6 chronic medical renal disease, stable  Problem #7 history of gout She is here today with her daughter and is doing very well. She is now 53. She is asymptomatic except for her weakness in her thighs and legs little bit of DJD and a little bit of back pain and stiffness in the mornings. She denies any other major symptomatology. She has no trouble with pain in her abdomen no chest pain no shortness of breath. She uses a walker or a cane around the house or out and about. Her vital signs are very stable lymph nodes are negative throughout. Lungs are clear. Heart shows no obvious murmur rub or gallop. Abdomen remains very soft, nontender without organomegaly or masses. Breast exam is negative for any masses. Skin exam is unremarkable. She has no leg edema no arm edema.  I will see her in 6 months and if all is well then she would like to be release from this clinic which I think is reasonable or the other alternative is to just see her once a year for another year or 2. We will see what she wants to do then.

## 2012-02-27 NOTE — Progress Notes (Signed)
Carly [name] presented for [employer]. Labs per MD order drawn via Peripheral Line 23 gauge needle inserted in left antecubital.  Good blood return present. Procedure without incident.  Needle removed intact. Patient tolerated procedure well.

## 2012-02-27 NOTE — Patient Instructions (Addendum)
[facility]  Discharge Instructions  RECOMMENDATIONS MADE BY THE CONSULTANT AND ANY TEST RESULTS WILL BE SENT TO YOUR REFERRING DOCTOR.   EXAM FINDINGS BY MD TODAY AND SIGNS AND SYMPTOMS TO REPORT TO CLINIC OR PRIMARY MD: exam and discussion by MD.  Carly [name] doing well.  We will see you in 6 months and if you are doing well we will probably release you from the clinic.    SPECIAL INSTRUCTIONS/FOLLOW-UP: Lab work Needed today and Return to Clinic in 6 months.   I acknowledge that I have been informed and understand all the instructions given to me and received a copy. I do not have any more questions at this time, but understand that I may call the Specialty Clinic at [facility] at [phone] during business hours should I have any further questions or need assistance in obtaining follow-up care.    __________________________________________  _____________  __________ Signature of Patient or Authorized Representative            Date                   Time    __________________________________________ Nurse's Signature

## 2012-02-28 ENCOUNTER — Other Ambulatory Visit: Payer: Self-pay | Admitting: Internal Medicine

## 2012-02-28 ENCOUNTER — Ambulatory Visit (HOSPITAL_COMMUNITY): Payer: Medicare Other | Admitting: Oncology

## 2012-03-25 ENCOUNTER — Encounter: Payer: Self-pay | Admitting: Internal Medicine

## 2012-03-25 ENCOUNTER — Ambulatory Visit (INDEPENDENT_AMBULATORY_CARE_PROVIDER_SITE_OTHER): Payer: Medicare Other | Admitting: Internal Medicine

## 2012-03-25 VITALS — BP 160/80 | HR 70 | Ht 61.0 in | Wt 123.0 lb

## 2012-03-25 DIAGNOSIS — I10 Essential (primary) hypertension: Secondary | ICD-10-CM

## 2012-03-25 MED ORDER — AMLODIPINE BESYLATE 2.5 MG PO TABS: 2.5000 mg | ORAL_TABLET | Freq: Every day | ORAL | Status: DC

## 2012-03-25 NOTE — Patient Instructions (Addendum)
Start Norvasc one half of 2.5 mg every day  BP check in [place] with nurse visit in [facility] 04/11/2012.

## 2012-03-26 NOTE — Progress Notes (Signed)
HPI Patient is a 78 year old with a history of CAD (s/p PTCA/Stent to the LAD and PTCA to ramus in 2003). I saw her in Jan 2013. She had a myoview over 1 yr ago that was negative for ischemia. She denies CP  Breathing is OK  Is slowing some with age.  No Known Allergies  Current Outpatient Prescriptions  Medication Sig Dispense Refill  . allopurinol (ZYLOPRIM) 100 MG tablet Take 1 tablet (100 mg total) by mouth daily.  30 tablet  2  . aspirin 81 MG tablet Take 81 mg by mouth daily.        . Calcium Carb-Cholecalciferol (CALCIUM 500 +D) 500-400 MG-UNIT TABS Take by mouth daily.        Marland Kitchen HYDRODIURIL 25 MG tablet TAKE ONE TABLET BY MOUTH ONCE DAILY.  90 each  1  . LOTREL 5-20 MG per capsule TAKE (1) CAPSULE BY MOUTH ONCE DAILY.  30 each  12  . meclizine (ANTIVERT) 25 MG tablet as needed.      . metoprolol (TOPROL-XL) 50 MG 24 hr tablet Take 50 mg by mouth daily.        . nitroGLYCERIN (NITROSTAT) 0.4 MG SL tablet Place 1 tablet (0.4 mg total) under the tongue every 5 (five) minutes as needed for chest pain.  25 tablet  3  . raloxifene (EVISTA) 60 MG tablet Take 60 mg by mouth daily.        Marland Kitchen ZOCOR 10 MG tablet TAKE (1) TABLET BY MOUTH AT BEDTIME.  30 tablet  6  . amLODipine (NORVASC) 2.5 MG tablet Take 1 tablet (2.5 mg total) by mouth daily.  30 tablet  6    Past Medical History  Diagnosis Date  . HTN (hypertension)   . Gout   . Dyslipidemia   . CAD (coronary artery disease)   . Cystocele   . Renal cell cancer   . Renal insufficiency   . Vaginal wall prolapse   . Hypercholesteremia     Past Surgical History  Procedure Date  . Nephrectomy   . Appendectomy     Family History  Problem Relation Age of Onset  . Cancer    . Heart disease      History   Social History  . Marital Status: Widowed    Spouse Name: N/A    Number of Children: 2  . Years of Education: N/A   Occupational History  . Not on file.   Social History Main Topics  . Smoking status: Never Smoker   .  Smokeless tobacco: Never Used  . Alcohol Use: No  . Drug Use: No  . Sexually Active: No   Other Topics Concern  . Not on file   Social History Narrative  . No narrative on file    Review of Systems:  All systems reviewed.  They are negative to the above problem except as previously stated.  Vital Signs: BP 160/80  Pulse 70  Ht 5\' 1"  (1.549 m)  Wt 123 lb (55.792 kg)  BMI 23.24 kg/m2  Physical Exam Patient is in NAD HEENT:  Normocephalic, atraumatic. EOMI, PERRLA.  Neck: JVP is normal.  No bruits.  Lungs: clear to auscultation. No rales no wheezes.  Heart: Regular rate and rhythm. Normal S1, S2. No S3.   No significant murmurs. PMI not displaced.  Abdomen:  Supple, nontender. Normal bowel sounds. No masses. No hepatomegaly.  Extremities:   Good distal pulses throughout. No lower extremity edema.  Musculoskeletal :moving all  extremities.  Neuro:   alert and oriented x3.  CN II-XII grossly intact.  EKG  SR 71    Assessment and Plan:  1.  CAD  No symptoms of angina.  Continue medical Rx  2.  HTN  Not controlled  On my check BP was 180/100  High at other clinic visits   Will add 1/2 of 2.5 mg amlodipine  Keep log of bp F/U next wk for BP check in [place].  3.  HL  WIll need to follw  Continue statin.

## 2012-04-09 ENCOUNTER — Telehealth: Payer: Self-pay | Admitting: *Deleted

## 2012-04-09 NOTE — Telephone Encounter (Signed)
Noted pt is scheduled for office visit with Harrington Challenger, however pt was noted to be on the schedule for Ross, notation from [place] indicates pt to have nurse visit, not MD office visit, contacted pt to advise she will NOT be seeing the MD on 04-11-12 however a nurse to check her BP, pt understood and will come in at the time already scheduled for her noted at 11am, schedule changed to nurse visit, pt understood all instructions and will keep apt

## 2012-04-11 ENCOUNTER — Ambulatory Visit (INDEPENDENT_AMBULATORY_CARE_PROVIDER_SITE_OTHER): Payer: Medicare Other | Admitting: *Deleted

## 2012-04-11 ENCOUNTER — Ambulatory Visit: Payer: Medicare Other | Admitting: Internal Medicine

## 2012-04-11 VITALS — BP 153/78 | HR 72 | Ht 61.5 in | Wt 125.8 lb

## 2012-04-11 DIAGNOSIS — I10 Essential (primary) hypertension: Secondary | ICD-10-CM

## 2012-04-11 NOTE — Progress Notes (Signed)
Rec that he increase amlodipine to 5 mg per day F/u BP with check in 4 wks.

## 2012-04-11 NOTE — Progress Notes (Signed)
Pt presented to [facility] today to re-check BP per new medication started on 03-25-12 Pt advised she feels good  Notations from last OV D/C Summary/VS as follows:  Start Norvasc one half of 2.5 mg every day  BP check in [place] with nurse visit in [facility] 04/11/2012.  BP 160/80  Pulse 70  Ht 5\' 1"  (1.549 m)  Wt 123 lb (55.792 kg)  BMI 23.24 kg/m2

## 2012-04-12 NOTE — Progress Notes (Signed)
Called pt to advise increase, pt noted she is currently taking half tab daily to equal 2.5mg  daily, pt agreed to take whole tab of 5mg  amlodipine and pt noted she has 6 refills on the 5mg  bottle, pt agreed to follow up for BP re-check in 4 weeks on 05-13-12 at 10am, pt will keep apt, changed medication dosage in chart to imply taking 5mg  tablets once daily of amlodipine

## 2012-05-13 ENCOUNTER — Ambulatory Visit (INDEPENDENT_AMBULATORY_CARE_PROVIDER_SITE_OTHER): Payer: Medicare Other | Admitting: *Deleted

## 2012-05-13 VITALS — BP 120/80 | HR 73 | Wt 124.0 lb

## 2012-05-13 DIAGNOSIS — I10 Essential (primary) hypertension: Secondary | ICD-10-CM

## 2012-05-13 NOTE — Progress Notes (Signed)
Presents for follow up blood pressure check with a list of adequately consistent blood pressures.  This is following a blood pressure check on 04/12/2011, where her pressure was 160/80, HR 70 and Amlodipine was added to her regimen.  There was one pressure on her list of 103/68, where she states she felt weak, but that it was an isolated incident, otherwise she is feeling fine.  Advised her to continue to monitor blood pressure and call us if she has any problems, going forward.

## 2012-05-16 ENCOUNTER — Other Ambulatory Visit (HOSPITAL_COMMUNITY): Payer: Self-pay | Admitting: Oncology

## 2012-05-16 DIAGNOSIS — Z8739 Personal history of other diseases of the musculoskeletal system and connective tissue: Secondary | ICD-10-CM

## 2012-05-16 MED ORDER — ALLOPURINOL 100 MG PO TABS: 100.0000 mg | ORAL_TABLET | Freq: Every day | ORAL | Status: DC

## 2012-05-28 ENCOUNTER — Other Ambulatory Visit: Payer: Self-pay | Admitting: Internal Medicine

## 2012-06-28 ENCOUNTER — Ambulatory Visit (INDEPENDENT_AMBULATORY_CARE_PROVIDER_SITE_OTHER): Payer: Medicare Other | Admitting: Urology

## 2012-06-28 DIAGNOSIS — Z85528 Personal history of other malignant neoplasm of kidney: Secondary | ICD-10-CM

## 2012-07-16 NOTE — Progress Notes (Signed)
Patient ID: Carly [name], female   DOB: 03/27/1919, 79 y.o.   MRN: [id number]  Excellent results. Continue current medication.

## 2012-08-15 ENCOUNTER — Other Ambulatory Visit (HOSPITAL_COMMUNITY): Payer: Self-pay | Admitting: Oncology

## 2012-08-15 DIAGNOSIS — Z8739 Personal history of other diseases of the musculoskeletal system and connective tissue: Secondary | ICD-10-CM

## 2012-08-15 MED ORDER — ALLOPURINOL 100 MG PO TABS: 100.0000 mg | ORAL_TABLET | Freq: Every day | ORAL | Status: DC

## 2012-08-27 ENCOUNTER — Ambulatory Visit (HOSPITAL_COMMUNITY): Payer: Medicare Other

## 2012-09-09 ENCOUNTER — Encounter (HOSPITAL_COMMUNITY): Payer: Self-pay | Admitting: Oncology

## 2012-09-09 ENCOUNTER — Encounter (HOSPITAL_COMMUNITY): Payer: Medicare Other | Attending: Oncology | Admitting: Oncology

## 2012-09-09 VITALS — BP 149/73 | HR 79 | Temp 97.5°F | Resp 16 | Wt 128.8 lb

## 2012-09-09 DIAGNOSIS — R609 Edema, unspecified: Secondary | ICD-10-CM

## 2012-09-09 DIAGNOSIS — Z8559 Personal history of malignant neoplasm of other urinary tract organ: Secondary | ICD-10-CM

## 2012-09-09 DIAGNOSIS — M7989 Other specified soft tissue disorders: Secondary | ICD-10-CM

## 2012-09-09 DIAGNOSIS — E7800 Pure hypercholesterolemia, unspecified: Secondary | ICD-10-CM | POA: Insufficient documentation

## 2012-09-09 DIAGNOSIS — I2510 Atherosclerotic heart disease of native coronary artery without angina pectoris: Secondary | ICD-10-CM | POA: Insufficient documentation

## 2012-09-09 DIAGNOSIS — G629 Polyneuropathy, unspecified: Secondary | ICD-10-CM

## 2012-09-09 DIAGNOSIS — Z09 Encounter for follow-up examination after completed treatment for conditions other than malignant neoplasm: Secondary | ICD-10-CM | POA: Insufficient documentation

## 2012-09-09 DIAGNOSIS — I10 Essential (primary) hypertension: Secondary | ICD-10-CM | POA: Insufficient documentation

## 2012-09-09 LAB — CBC WITH DIFFERENTIAL/PLATELET
Hemoglobin: 12.2 g/dL (ref 12.0–15.0)
Lymphocytes Relative: 35 % (ref 12–46)
Lymphs Abs: 3.5 10*3/uL (ref 0.7–4.0)
Monocytes Relative: 9 % (ref 3–12)
Neutro Abs: 5 10*3/uL (ref 1.7–7.7)
Neutrophils Relative %: 50 % (ref 43–77)
Platelets: 183 10*3/uL (ref 150–400)
RBC: 3.75 MIL/uL — ABNORMAL LOW (ref 3.87–5.11)
WBC: 10 10*3/uL (ref 4.0–10.5)

## 2012-09-09 LAB — COMPREHENSIVE METABOLIC PANEL
ALT: 12 U/L (ref 0–35)
Alkaline Phosphatase: 72 U/L (ref 39–117)
CO2: 22 mEq/L (ref 19–32)
Chloride: 105 mEq/L (ref 96–112)
GFR calc Af Amer: 31 mL/min — ABNORMAL LOW (ref >90)
GFR calc non Af Amer: 27 mL/min — ABNORMAL LOW (ref >90)
Glucose, Bld: 104 mg/dL — ABNORMAL HIGH (ref 70–99)
Potassium: 5.3 mEq/L — ABNORMAL HIGH (ref 3.5–5.1)
Sodium: 138 mEq/L (ref 135–145)
Total Bilirubin: 0.2 mg/dL — ABNORMAL LOW (ref 0.3–1.2)
Total Protein: 7 g/dL (ref 6.0–8.3)

## 2012-09-09 NOTE — Progress Notes (Signed)
Carly [name] presented for [employer]. Labs per MD order drawn via Peripheral Line 23 gauge needle inserted in left antecubital.  Good blood return present. Procedure without incident.  Needle removed intact. Patient tolerated procedure well.

## 2012-09-09 NOTE — Patient Instructions (Addendum)
[facility] Discharge Instructions  RECOMMENDATIONS MADE BY THE CONSULTANT AND ANY TEST RESULTS WILL BE SENT TO YOUR REFERRING PHYSICIAN.  Lab work today. We will release you from the clinic. If you are ever in need of our services again we would be honored to take care of you.  Thank you for choosing [facility] to provide your oncology and hematology care.  To afford each patient quality time with our providers, please arrive at least 15 minutes before your scheduled appointment time.  With your help, our goal is to use those 15 minutes to complete the necessary work-up to ensure our physicians have the information they need to help with your evaluation and healthcare recommendations.    Effective January 1st, 2014, we ask that you re-schedule your appointment with our physicians should you arrive 10 or more minutes late for your appointment.  We strive to give you quality time with our providers, and arriving late affects you and other patients whose appointments are after yours.    Again, thank you for choosing [facility].  Our hope is that these requests will decrease the amount of time that you wait before being seen by our physicians.       _____________________________________________________________  Should you have questions after your visit to [facility], please contact our office at [phone] between the hours of 8:30 a.m. and 5:00 p.m.  Voicemails left after 4:30 p.m. will not be returned until the following business day.  For prescription refill requests, have your pharmacy contact our office with your prescription refill request.

## 2012-09-09 NOTE — Progress Notes (Signed)
#  1 transitional cell carcinoma the right pelvic ureter status post right nephro ureterectomy on 08/17/2005 for a T2 NX MX lesion with no evidence of metastatic disease. This was a high-grade, 3.5 cm cancer #2 probable peripheral neuropathy of her feet with burning discomfort on the size and dorsum of the feet worse at night. She has no trouble walking during the day but the last 2 months has helped this discomfort which is both burning and feeling funny in her feet sometimes aching. #3 history of gout #4 hypertension #5 hypercholesterolemia #6 chronic renal medical disease, stable #7 CAD with stent placed years ago  The only thing new on her oncology review of systems is the unusual sensations in both feet that she has felt for at least 2 months. They bother her always at night. She has blamed this on swelling of her legs which is intermittently present but perhaps slightly worse compared to 6 months ago. She has trace to 1+ edema of both legs pretibially. She is low puffiness of the feet. She has no obvious arthritis or recurrence of gallop in the feet. sHe has not lost weight. She does not have night sweats fevers etc.  Her exam is otherwise benign stable weight. Her weight is actually up a few pounds. She has no lymphadenopathy. Breast exam is negative for masses. Lungs are clear. Heart shows a regular rhythm and rate without murmur or gallop. Abdomen remains soft and nontender without hepatosplenomegaly or masses. She has pulses which are diminished in her feet. She is alert and appears oriented. Facial symmetry appears intact.  I do feel that she may have a peripheral neuropathy developing. We do need to check her blood work and vitamin levels. They are intact we'll release her from this clinic and let her followup with her primary care physician. Her daughter will call us tomorrow

## 2012-09-09 NOTE — Addendum Note (Signed)
Addended by: Berneta Levins on: 09/09/2012 03:25 PM   Modules accepted: Orders

## 2012-09-10 LAB — TSH: TSH: 3.314 u[IU]/mL (ref 0.350–4.500)

## 2012-09-27 ENCOUNTER — Other Ambulatory Visit: Payer: Self-pay | Admitting: Internal Medicine

## 2012-10-01 ENCOUNTER — Other Ambulatory Visit: Payer: Self-pay

## 2012-11-03 ENCOUNTER — Emergency Department (HOSPITAL_COMMUNITY): Payer: Medicare Other

## 2012-11-03 ENCOUNTER — Encounter (HOSPITAL_COMMUNITY): Payer: Self-pay | Admitting: *Deleted

## 2012-11-03 ENCOUNTER — Observation Stay (HOSPITAL_COMMUNITY)
Admission: EM | Admit: 2012-11-03 | Discharge: 2012-11-04 | Disposition: A | Discharge status: Home / Self Care | Payer: Medicare Other | Attending: Family Medicine | Admitting: Family Medicine

## 2012-11-03 DIAGNOSIS — N289 Disorder of kidney and ureter, unspecified: Secondary | ICD-10-CM | POA: Insufficient documentation

## 2012-11-03 DIAGNOSIS — I10 Essential (primary) hypertension: Secondary | ICD-10-CM

## 2012-11-03 DIAGNOSIS — N183 Chronic kidney disease, stage 3 unspecified: Secondary | ICD-10-CM

## 2012-11-03 DIAGNOSIS — I2510 Atherosclerotic heart disease of native coronary artery without angina pectoris: Secondary | ICD-10-CM | POA: Diagnosis present

## 2012-11-03 DIAGNOSIS — R079 Chest pain, unspecified: Secondary | ICD-10-CM

## 2012-11-03 DIAGNOSIS — R0789 Other chest pain: Secondary | ICD-10-CM | POA: Insufficient documentation

## 2012-11-03 DIAGNOSIS — I200 Unstable angina: Principal | ICD-10-CM | POA: Insufficient documentation

## 2012-11-03 DIAGNOSIS — E785 Hyperlipidemia, unspecified: Secondary | ICD-10-CM

## 2012-11-03 LAB — CBC WITH DIFFERENTIAL/PLATELET
Basophils Absolute: 0.1 10*3/uL (ref 0.0–0.1)
Basophils Relative: 1 % (ref 0–1)
Lymphocytes Relative: 42 % (ref 12–46)
MCHC: 34 g/dL (ref 30.0–36.0)
Neutro Abs: 4 10*3/uL (ref 1.7–7.7)
Platelets: 186 10*3/uL (ref 150–400)
RDW: 14.4 % (ref 11.5–15.5)
WBC: 9.2 10*3/uL (ref 4.0–10.5)

## 2012-11-03 LAB — TROPONIN I
Troponin I: <0.3 ng/mL (ref <0.30)
Troponin I: <0.3 ng/mL (ref <0.30)

## 2012-11-03 LAB — BASIC METABOLIC PANEL
CO2: 23 mEq/L (ref 19–32)
Calcium: 9.5 mg/dL (ref 8.4–10.5)
Chloride: 106 mEq/L (ref 96–112)
Creatinine, Ser: 1.6 mg/dL — ABNORMAL HIGH (ref 0.50–1.10)
GFR calc Af Amer: 31 mL/min — ABNORMAL LOW (ref >90)
Sodium: 139 mEq/L (ref 135–145)

## 2012-11-03 MED ORDER — ACETAMINOPHEN 650 MG RE SUPP: 650.0000 mg | Freq: Four times a day (QID) | RECTAL | Status: DC | PRN

## 2012-11-03 MED ORDER — HYDROCHLOROTHIAZIDE 25 MG PO TABS
25.0000 mg | ORAL_TABLET | Freq: Every day | ORAL | Status: DC
  Administered 2012-11-03 – 2012-11-04 (×2): 25 mg via ORAL
  Filled 2012-11-03 (×2): qty 1

## 2012-11-03 MED ORDER — BENAZEPRIL HCL 10 MG PO TABS
20.0000 mg | ORAL_TABLET | Freq: Every day | ORAL | Status: DC
  Administered 2012-11-03 – 2012-11-04 (×2): 20 mg via ORAL
  Filled 2012-11-03 (×2): qty 2

## 2012-11-03 MED ORDER — NITROGLYCERIN 0.4 MG SL SUBL
0.4000 mg | SUBLINGUAL_TABLET | SUBLINGUAL | Status: DC | PRN
  Filled 2012-11-03: qty 25

## 2012-11-03 MED ORDER — ALUM & MAG HYDROXIDE-SIMETH 200-200-20 MG/5ML PO SUSP: 30.0000 mL | Freq: Four times a day (QID) | ORAL | Status: DC | PRN

## 2012-11-03 MED ORDER — ASPIRIN 81 MG PO CHEW
324.0000 mg | CHEWABLE_TABLET | Freq: Once | ORAL | Status: AC
  Administered 2012-11-03: 324 mg via ORAL
  Filled 2012-11-03: qty 4

## 2012-11-03 MED ORDER — RALOXIFENE HCL 60 MG PO TABS
60.0000 mg | ORAL_TABLET | Freq: Every day | ORAL | Status: DC
  Administered 2012-11-03: 60 mg via ORAL

## 2012-11-03 MED ORDER — ASPIRIN 81 MG PO TABS: 81.0000 mg | ORAL_TABLET | Freq: Every day | ORAL | Status: DC

## 2012-11-03 MED ORDER — ASPIRIN EC 81 MG PO TBEC
81.0000 mg | DELAYED_RELEASE_TABLET | Freq: Every day | ORAL | Status: DC
  Administered 2012-11-04: 81 mg via ORAL
  Filled 2012-11-03: qty 1

## 2012-11-03 MED ORDER — ACETAMINOPHEN 325 MG PO TABS: 650.0000 mg | ORAL_TABLET | Freq: Four times a day (QID) | ORAL | Status: DC | PRN

## 2012-11-03 MED ORDER — ONDANSETRON HCL 4 MG/2ML IJ SOLN: 4.0000 mg | Freq: Four times a day (QID) | INTRAMUSCULAR | Status: DC | PRN

## 2012-11-03 MED ORDER — ONDANSETRON HCL 4 MG/2ML IJ SOLN: 4.0000 mg | Freq: Three times a day (TID) | INTRAMUSCULAR | Status: DC | PRN

## 2012-11-03 MED ORDER — RALOXIFENE HCL 60 MG PO TABS
60.0000 mg | ORAL_TABLET | Freq: Every day | ORAL | Status: DC
  Filled 2012-11-03: qty 1

## 2012-11-03 MED ORDER — NITROGLYCERIN 0.4 MG SL SUBL: 0.4000 mg | SUBLINGUAL_TABLET | SUBLINGUAL | Status: DC | PRN

## 2012-11-03 MED ORDER — SODIUM CHLORIDE 0.9 % IJ SOLN
3.0000 mL | Freq: Two times a day (BID) | INTRAMUSCULAR | Status: DC
  Administered 2012-11-03 – 2012-11-04 (×2): 3 mL via INTRAVENOUS

## 2012-11-03 MED ORDER — METOPROLOL SUCCINATE ER 50 MG PO TB24
50.0000 mg | ORAL_TABLET | Freq: Every day | ORAL | Status: DC
  Administered 2012-11-03 – 2012-11-04 (×2): 50 mg via ORAL
  Filled 2012-11-03 (×2): qty 1

## 2012-11-03 MED ORDER — SIMVASTATIN 10 MG PO TABS
10.0000 mg | ORAL_TABLET | Freq: Every day | ORAL | Status: DC
  Administered 2012-11-03: 10 mg via ORAL
  Filled 2012-11-03: qty 1

## 2012-11-03 MED ORDER — AMLODIPINE BESY-BENAZEPRIL HCL 5-20 MG PO CAPS: 1.0000 | ORAL_CAPSULE | Freq: Every day | ORAL | Status: DC

## 2012-11-03 MED ORDER — BENAZEPRIL HCL 10 MG PO TABS: 5.0000 mg | ORAL_TABLET | Freq: Every day | ORAL | Status: DC

## 2012-11-03 MED ORDER — ONDANSETRON HCL 4 MG PO TABS: 4.0000 mg | ORAL_TABLET | Freq: Four times a day (QID) | ORAL | Status: DC | PRN

## 2012-11-03 NOTE — ED Notes (Signed)
Pt c/o centralized, constant, nagging chest pain since she woke up this morning around 3-3:30. Pt states she took 1 nitro without relief.

## 2012-11-03 NOTE — ED Provider Notes (Addendum)
CSN: [id number]     Arrival date & time 11/03/12  0621 History    This chart was scribed for Carly Essex, MD by Jeralyn Ruths, ED Scribe. The patient was seen in room APA14/APA14 and the patient's care was started at 7:05 AM.   Chief Complaint  Patient presents with  . Chest Pain   The history is provided by the patient, medical records and a relative. No language interpreter was used.   HPI Comments: Carly [name] is a 79 y.o. female with a h/o HTN, gout, and CADwho presents to the Emergency Department complaining of waxing and waning central chest pain onset 3 am.  Pt reports that this pain is more intense than normal and woke her up from sleeping this morning.  She reports having similar less intense pain before, but denies history of heart attack.  She states that usually when she gets up and moving any chest pain resolves. Pt also complains of swelling in her feet.  She describes her pain in her feet as stinging and burning.  Pt reports taking baby aspirin every day. Pt denies headache, diaphoresis, fever, chills, nausea, vomiting, diarrhea, weakness, cough, SOB and any other pain.  Pt denies taking bp medication this morning as she came in to ED so early.  She reports NTG offered slight relief.  Daughter reports pt just switched HTN medication on Thursday.  Last stress test in 2012.  PCP is Dr Everette Rank. Past Medical History  Diagnosis Date  . HTN (hypertension)   . Gout   . Dyslipidemia   . CAD (coronary artery disease)   . Cystocele   . Renal cell cancer   . Renal insufficiency   . Vaginal wall prolapse   . Hypercholesteremia    Past Surgical History  Procedure Laterality Date  . Nephrectomy    . Appendectomy     Family History  Problem Relation Age of Onset  . Cancer    . Heart disease     History  Substance Use Topics  . Smoking status: Never Smoker   . Smokeless tobacco: Never Used  . Alcohol Use: No   OB History   Grav Para Term Preterm Abortions TAB SAB Ect  Mult Living                 Review of Systems  Cardiovascular: Positive for chest pain.  Musculoskeletal: Negative for back pain.  A complete 10 system review of systems was obtained and all systems are negative except as noted in the HPI and PMH.   Allergies  Review of patient's allergies indicates no known allergies.  Home Medications   Current Outpatient Rx  Name  Route  Sig  Dispense  Refill  . aspirin 81 MG tablet   Oral   Take 81 mg by mouth daily.           . benazepril (LOTENSIN) 5 MG tablet   Oral   Take 5 mg by mouth daily.         . Calcium Carb-Cholecalciferol (CALCIUM 500 +D) 500-400 MG-UNIT TABS   Oral   Take by mouth daily.           . hydrochlorothiazide (HYDRODIURIL) 25 MG tablet      TAKE ONE TABLET BY MOUTH ONCE DAILY.   90 tablet   3   . LOTREL 5-20 MG per capsule      TAKE (1) CAPSULE BY MOUTH ONCE DAILY.   30 each   12   .  meclizine (ANTIVERT) 25 MG tablet      as needed.         . metoprolol (TOPROL-XL) 50 MG 24 hr tablet   Oral   Take 50 mg by mouth daily.           . nitroGLYCERIN (NITROSTAT) 0.4 MG SL tablet   Sublingual   Place 1 tablet (0.4 mg total) under the tongue every 5 (five) minutes as needed for chest pain.   25 tablet   3   . raloxifene (EVISTA) 60 MG tablet   Oral   Take 60 mg by mouth daily.           . simvastatin (ZOCOR) 10 MG tablet      TAKE (1) TABLET BY MOUTH AT BEDTIME.   30 tablet   3    BP 182/65  Pulse 75  Temp(Src) 97.8 F (36.6 C) (Oral)  Resp 20  Ht 5\' 2"  (1.575 m)  Wt 124 lb (56.246 kg)  BMI 22.67 kg/m2  SpO2 95% Physical Exam  Nursing note and vitals reviewed. Constitutional: She is oriented to person, place, and time. She appears well-developed and well-nourished. No distress.  HENT:  Head: Normocephalic and atraumatic.  Mouth/Throat: Oropharynx is clear and moist. No oropharyngeal exudate.  Eyes: Conjunctivae and EOM are normal. Pupils are equal, round, and reactive to  light.  Neck: Neck supple. No tracheal deviation present.  Cardiovascular: Normal rate, regular rhythm, normal heart sounds and intact distal pulses.  Exam reveals no gallop and no friction rub.   No murmur heard. Pulmonary/Chest: Effort normal and breath sounds normal. No respiratory distress. She exhibits no tenderness.  Abdominal: Soft. There is no tenderness. There is no rebound and no guarding.  Musculoskeletal: Normal range of motion. She exhibits no edema and no tenderness.  2+ DP and PT pulses.  Neurological: She is alert and oriented to person, place, and time. No cranial nerve deficit. She exhibits normal muscle tone. Coordination normal.  Skin: Skin is warm and dry.  Psychiatric: She has a normal mood and affect. Her behavior is normal.   ED Course   Procedures (including critical care time) DIAGNOSTIC STUDIES: Filed Vitals:   11/03/12 0637 11/03/12 0641 11/03/12 0744 11/03/12 0744  BP: 182/65  181/76 181/67  Pulse: 75     Temp: 97.8 F (36.6 C)     TempSrc: Oral     Resp: 20     Height: 5\' 2"  (1.575 m)     Weight:  124 lb (56.246 kg)    SpO2: 95%       COORDINATION OF CARE: 7:11 AM - Discussed ED treatment with pt at bedside including pain management and blood work and pt agrees.  Discussed possible stress test.   Labs Reviewed  CBC WITH DIFFERENTIAL - Abnormal; Notable for the following:    Neutrophils Relative % 42 (*)    Eosinophils Relative 6 (*)    All other components within normal limits  BASIC METABOLIC PANEL - Abnormal; Notable for the following:    Glucose, Bld 102 (*)    BUN 38 (*)    Creatinine, Ser 1.60 (*)    GFR calc non Af Amer 27 (*)    GFR calc Af Amer 31 (*)    All other components within normal limits  TROPONIN I   Dg Chest Portable 1 View  11/03/2012   *RADIOLOGY REPORT*  Clinical Data: Centralized chest pain since this morning of at 3 am.  PORTABLE CHEST -  1 VIEW  Comparison: 11/25/2008  Findings: Borderline heart size with normal  pulmonary vascularity. Calcification of the aorta.  No focal consolidation in the lungs. No blunting of costophrenic angles.  No pneumothorax.  There is an 11 mm nodular opacity in the right upper lung which was not present previously.  CT is recommended to exclude developing pulmonary nodule.  IMPRESSION: No evidence of active pulmonary disease.  Suggestion of developing nodular opacity in the right upper lung.  CT recommended.   Original Report Authenticated By: Lucienne Capers, M.D.   1. Chest pain   2. Hypertension     MDM  Substernal chest pain since 3 AM it comes and goes and is somewhat relieved with nitroglycerin. No shortness of breath, cough or fever. CAD (s/p PTCA/Stent to the LAD and PTCA to ramus in 2003. Myoview negative 2012.   EKG unchanged and nonischemic. Troponin negative. Creatinine elevated but stable. Questionable nodule on CXR. No other abnormality.  Will admit for observation given known history of CAD, age, and risk factors. D/w Dr. Sarajane Jews.   Date: 11/03/2012  Rate: 77  Rhythm: normal sinus rhythm  QRS Axis: left  Intervals: normal  ST/T Wave abnormalities: normal  Conduction Disutrbances:none  Narrative Interpretation:   Old EKG Reviewed: unchanged     I personally performed the services described in this documentation, which was scribed in my presence. The recorded information has been reviewed and is accurate.    Carly Essex, MD 11/03/12 [id number]  Carly Essex, MD 11/03/12 [id number]

## 2012-11-03 NOTE — H&P (Signed)
History and Physical  Carly [name] [id number] DOB: 1918/05/13 DOA: 11/03/2012  Referring physician: [name], MD PCP: Lanette Hampshire, MD  Cardiologist: Dorris Carnes, M.D.  Chief Complaint: chest pain  HPI:  79 year old woman presented to the emergency department with chest pain. Pain was relieved in the emergency department with nitroglycerin and aspirin. Initial evaluation was unremarkable and patient was referred for cardiac rule out.  Patient has a history of coronary artery disease but not MI. No recent history of chest pain until this morning when she noticed that discomfort retrosternally approximately 4-5 AM while she was ready up. She cannot characterize it on a scale of 1/10 but notes it was somewhat low intensity. She took a nitroglycerin which did not fully relieve the discomfort. She cannot characterize it further. No shortness of breath, left arm, neck or jaw pain, diaphoresis. No nausea or vomiting.  In the emergency department she was noted be afebrile stable vital signs. Screening laboratory studies were unremarkable, first appointment was negative, EKG nonacute. Chest x-ray was abnormal and CT scan of the chest was obtained, no evidence of acute pulmonary disease. Coronary atherosclerosis was seen as well as an abnormality the left subclavian. She was treated with aspirin but did not require nitroglycerin the emergency department.  Review of Systems:  Negative for fever, visual changes, sore throat, rash, new muscle aches, SOB, dysuria, bleeding, n/v/abdominal pain.  Past Medical History  Diagnosis Date  . HTN (hypertension)   . Gout   . Dyslipidemia   . CAD (coronary artery disease)   . Cystocele   . Renal cell cancer   . Renal insufficiency   . Vaginal wall prolapse   . Hypercholesteremia     Past Surgical History  Procedure Laterality Date  . Nephrectomy      right  . Appendectomy      Social History:  reports that she has never smoked. She has  never used smokeless tobacco. She reports that she does not drink alcohol or use illicit drugs.  No Known Allergies  Family History  Problem Relation Age of Onset  . Cancer    . Heart disease     she is not aware of any particular medical problems in first-degree relatives.   Prior to Admission medications   Medication Sig Start Date End Date Taking? Authorizing Provider  aspirin 81 MG tablet Take 81 mg by mouth daily.     Yes Historical Provider, MD  benazepril (LOTENSIN) 5 MG tablet Take 5 mg by mouth daily.   Yes Historical Provider, MD  Calcium Carb-Cholecalciferol (CALCIUM 500 +D) 500-400 MG-UNIT TABS Take by mouth daily.     Yes Historical Provider, MD  hydrochlorothiazide (HYDRODIURIL) 25 MG tablet TAKE ONE TABLET BY MOUTH ONCE DAILY. 05/28/12  Yes [name], MD  LOTREL 5-20 MG per capsule TAKE (1) CAPSULE BY MOUTH ONCE DAILY. 11/06/11  Yes [name], MD  meclizine (ANTIVERT) 25 MG tablet as needed. 04/27/11  Yes Historical Provider, MD  metoprolol (TOPROL-XL) 50 MG 24 hr tablet Take 50 mg by mouth daily.     Yes Historical Provider, MD  nitroGLYCERIN (NITROSTAT) 0.4 MG SL tablet Place 1 tablet (0.4 mg total) under the tongue every 5 (five) minutes as needed for chest pain. 04/28/11 11/03/12 Yes [name], MD  raloxifene (EVISTA) 60 MG tablet Take 60 mg by mouth daily.     Yes Historical Provider, MD  simvastatin (ZOCOR) 10 MG tablet TAKE (1) TABLET BY MOUTH AT BEDTIME. 09/27/12  Yes [name], MD   Physical Exam: Filed Vitals:   11/03/12 0744 11/03/12 0744 11/03/12 0828 11/03/12 0830  BP: 181/76 181/67  175/72  Pulse:   65 68  Temp:      TempSrc:      Resp:   16 15  Height:      Weight:      SpO2:   96% 97%    General:  Appears calm and comfortable. Nontoxic. Examined in the emergency department. Eyes: PERRL, normal lids, irises  ENT: grossly normal hearing, lips  Neck: no LAD, masses or thyromegaly Cardiovascular: RRR, no m/r/g. No LE edema. Respiratory: CTA  bilaterally, no w/r/r. Normal respiratory effort. Abdomen: soft, ntnd Skin: no rash or induration seen  Musculoskeletal: grossly normal tone BUE/BLE Psychiatric: grossly normal mood and affect, speech fluent and appropriate Neurologic: grossly non-focal.  Wt Readings from Last 3 Encounters:  11/03/12 56.246 kg (124 lb)  09/09/12 58.423 kg (128 lb 12.8 oz)  05/13/12 56.246 kg (124 lb)    Labs on Admission:  Basic Metabolic Panel:  Recent Labs Lab 11/03/12 0638  NA 139  K 4.5  CL 106  CO2 23  GLUCOSE 102*  BUN 38*  CREATININE 1.60*  CALCIUM 9.5    CBC:  Recent Labs Lab 11/03/12 0638  WBC 9.2  NEUTROABS 4.0  HGB 12.9  HCT 37.9  MCV 97.9  PLT 186    Cardiac Enzymes:  Recent Labs Lab 11/03/12 0638  TROPONINI <0.30    Radiological Exams on Admission: Ct Chest Wo Contrast  11/03/2012   *RADIOLOGY REPORT*  Clinical Data: Chest pain. History of renal malignancy status post right nephrectomy.  Lung nodule.  CT CHEST WITHOUT CONTRAST  Technique:  Multidetector CT imaging of the chest was performed following the standard protocol without IV contrast.  Comparison: Chest x-ray 08/23/2005.  Findings:  Mediastinum: Heart size is normal. There is no significant pericardial fluid, thickening or pericardial calcification. There is atherosclerosis of the thoracic aorta, the great vessels of the mediastinum and the coronary arteries, including calcified atherosclerotic plaque in the left main, left anterior descending, left circumflex and right coronary arteries. Prominent calcified plaque in the proximal left subclavian artery (image 9 of series 2) which likely results in a hemodynamically significant stenosis.  No pathologically enlarged mediastinal or hilar lymph nodes. Please note that accurate exclusion of hilar adenopathy is limited on noncontrast CT scans.  Small hiatal hernia.  Lungs/Pleura: No suspicious appearing pulmonary nodules or masses are identified.  No acute  consolidative air space disease.  Linear opacities in the inferior segment of the lingula and in the lower lobes of the lungs bilaterally are compatible with mild chronic scarring.  No pleural effusions.  Upper Abdomen: Small calcified gallstones layering dependently in the gallbladder.  No evidence of acute cholecystitis in the visualized portions of the upper abdomen.  Status post right nephrectomy.  Extensive atherosclerosis.  Musculoskeletal: There are no aggressive appearing lytic or blastic lesions noted in the visualized portions of the skeleton.  IMPRESSION: 1.  No acute findings in the thorax. 2. Atherosclerosis, including left main and three-vessel coronary artery disease. In addition, there is a large calcified plaque in the proximal left subclavian artery which likely results in hemodynamically significant stenosis (impossible to completely assess on today's noncontrast CT examination). 3.  No suspicious appearing pulmonary nodules or masses are identified to suggest metastatic disease of the thorax at this time. 4.  Scattered areas of mild scarring throughout the lung bases bilaterally,  as above.   Original Report Authenticated By: Vinnie Langton, M.D.   Dg Chest Portable 1 View  11/03/2012   *RADIOLOGY REPORT*  Clinical Data: Centralized chest pain since this morning of at 3 am.  PORTABLE CHEST - 1 VIEW  Comparison: 11/25/2008  Findings: Borderline heart size with normal pulmonary vascularity. Calcification of the aorta.  No focal consolidation in the lungs. No blunting of costophrenic angles.  No pneumothorax.  There is an 11 mm nodular opacity in the right upper lung which was not present previously.  CT is recommended to exclude developing pulmonary nodule.  IMPRESSION: No evidence of active pulmonary disease.  Suggestion of developing nodular opacity in the right upper lung.  CT recommended.   Original Report Authenticated By: Lucienne Capers, M.D.    EKG: Independently reviewed. Normal  sinus rhythm. No acute changes.   Principal Problem:   Chest pain Active Problems:   CAD, NATIVE VESSEL   Chronic kidney disease, stage III (moderate)   Assessment/Plan 1. Chest pain, predominantly atypical: Initial evaluation unremarkable. Currently pain-free. Serial cardiac enzymes, observe overnight. No recent chest pain. If rules out could potentially be discharged home tomorrow morning with outpatient followup with her cardiologist. Continue metoprolol, aspirin.  2. Chronic kidney disease stage III-IV: Appears to be at baseline. 3. Coronary artery disease: Continue aspirin. Nitroglycerin as needed. 4. Hypertension: This appears stable. 5. Calcified plaque left subclavian artery: Possibly hemodynamically significant. Asymptomatic. Followup as an outpatient. History of gout 6. Hyperlipidemia: Continue statin. Check fasting lipid panel.  CT of the chest was unremarkable for pulmonary pathology.  Code Status: DNR/DNI DVT prophylaxis:SCDs Family Communication: Disposition Plan/Anticipated LOS: observation to Dr. Everette Rank' service. Anticipate <48 hours.  Time spent: 50 minutes  Murray Hodgkins, MD  [facility] Pager [phone] 11/03/2012, 8:57 AM

## 2012-11-03 NOTE — ED Notes (Signed)
Refused to take NTG, states she does not have any pain at present

## 2012-11-04 DIAGNOSIS — I10 Essential (primary) hypertension: Secondary | ICD-10-CM

## 2012-11-04 LAB — TROPONIN I: Troponin I: <0.3 ng/mL (ref <0.30)

## 2012-11-04 LAB — LIPID PANEL: HDL: 63 mg/dL (ref >39)

## 2012-11-04 MED ORDER — AMLODIPINE BESY-BENAZEPRIL HCL 5-10 MG PO CAPS: ORAL_CAPSULE | ORAL | Status: DC

## 2012-11-04 MED ORDER — AMLODIPINE BESYLATE 5 MG PO TABS
5.0000 mg | ORAL_TABLET | Freq: Every day | ORAL | Status: DC
  Administered 2012-11-04: 5 mg via ORAL
  Filled 2012-11-04: qty 1

## 2012-11-04 NOTE — Progress Notes (Signed)
Discharge instructions were given on medications and follow up visits, patient verbalized understanding. Family at bedside.No c/o pain or discomfort noted. Accompanied by staff to an awaiting vehicle.

## 2012-11-04 NOTE — Consult Note (Signed)
CARDIOLOGY CONSULT NOTE  Patient ID: Carly [name] MRN: [id number] DOB/AGE: [id number] 77 y.o.  Admit date: 11/03/2012 Referring Physician: :Philomena Doheny  Primary 49 G, MD Primary Cardiologist: Dorris Carnes Reason for Consultation: Chest Pain with known history of CAD  HPI: Carly [name] is a 49 female with known history of CAD (s/p PTCA/Stent to the LAD and PTCA to ramus in 2003, hypertension,  but no prior MI, followed by Dr. Harrington Challenger, admitted with chest pain. She describes this as a "funny feeling in her chest" occuring around 3:30 am, while she was awake, no overt pain,radiation dyspnea or diaphoresis. Took NTG at home with no relief. Waited until around 6am and called her daughter when discomfort continued. Brought her to ER. She was treated with ASA and admitted for observation. She has been without complaint since admission.    She was found to by hypertensive with BP 181/65, EKG demonstrated NSR, without ACS.  CT scan of the chest demonstrated left main and three-vessel coronary artery disease. In addition, there is a large calcified plaque in the proximal left subclavian artery. Troponin negative X3.     She states that last week,Lotresl was discontinued by Dr. Everette Rank because of LEE, and changed to benazepril 5mg  daily only. She was also taken off an evening dose of amlodipine 5 mg. She continued on metoprolol and ASA. Since admission, ACE has been increased in dose to 20 mg daily from ( 5 mg daily at home). Amlodipine has been reinstituted at 5 mg. She is anxious to go home as she is feeling better.   Review of systems complete and found to be negative unless listed above   Past Medical History  Diagnosis Date  . HTN (hypertension)   . Gout   . Dyslipidemia   . CAD (coronary artery disease)   . Cystocele   . Renal cell cancer   . Renal insufficiency   . Vaginal wall prolapse   . Hypercholesteremia     Family History  Problem Relation Age of Onset  .  Cancer    . Heart disease      History   Social History  . Marital Status: Widowed    Spouse Name: N/A    Number of Children: 2  . Years of Education: N/A   Occupational History  . Not on file.   Social History Main Topics  . Smoking status: Never Smoker   . Smokeless tobacco: Never Used  . Alcohol Use: No  . Drug Use: No  . Sexually Active: No   Other Topics Concern  . Not on file   Social History Narrative  . No narrative on file    Past Surgical History  Procedure Laterality Date  . Nephrectomy      right  . Appendectomy       Prescriptions prior to admission  Medication Sig Dispense Refill  . aspirin 81 MG tablet Take 81 mg by mouth daily.        . benazepril (LOTENSIN) 5 MG tablet Take 5 mg by mouth daily.      . Calcium Carb-Cholecalciferol (CALCIUM 500 +D) 500-400 MG-UNIT TABS Take by mouth daily.        . hydrochlorothiazide (HYDRODIURIL) 25 MG tablet TAKE ONE TABLET BY MOUTH ONCE DAILY.  90 tablet  3  . meclizine (ANTIVERT) 25 MG tablet as needed.      . metoprolol (TOPROL-XL) 50 MG 24 hr tablet Take 50 mg by mouth daily.        Marland Kitchen  nitroGLYCERIN (NITROSTAT) 0.4 MG SL tablet Place 1 tablet (0.4 mg total) under the tongue every 5 (five) minutes as needed for chest pain.  25 tablet  3  . raloxifene (EVISTA) 60 MG tablet Take 60 mg by mouth daily.        . simvastatin (ZOCOR) 10 MG tablet TAKE (1) TABLET BY MOUTH AT BEDTIME.  30 tablet  3    Physical Exam: Blood pressure 154/68, pulse 81, temperature 99 F (37.2 C), temperature source Oral, resp. rate 18, height 5\' 2"  (1.575 m), weight 124 lb (56.246 kg), SpO2 94.00%.   General: Well developed, well nourished, in no acute distress Head: Eyes PERRLA, No xanthomas.   Normal cephalic and atramatic  Lungs: Clear bilaterally to auscultation and percussion. Heart: HRRR S1 S2, without MRG.  Pulses are 2+ & equal.            No carotid bruit. No JVD.  No abdominal bruits. No femoral bruits. Abdomen: Bowel sounds  are positive, abdomen soft and non-tender without masses or                  Hernia's noted. Msk:  Back normal, normal gait. Normal strength and tone for age. Extremities: No clubbing, cyanosis or edema.  DP +1 Neuro: Alert and oriented X 3. Psych:  Good affect, responds appropriately   Labs:   Lab Results  Component Value Date   WBC 9.2 11/03/2012   HGB 12.9 11/03/2012   HCT 37.9 11/03/2012   MCV 97.9 11/03/2012   PLT 186 11/03/2012    Recent Labs Lab 11/03/12 0638  NA 139  K 4.5  CL 106  CO2 23  BUN 38*  CREATININE 1.60*  CALCIUM 9.5  GLUCOSE 102*   Lab Results  Component Value Date   TROPONINI <0.30 11/04/2012    Echocardiogram 2003 SUMMARY - Overall left ventricular systolic function was normal. Left ventricular ejection fraction was estimated , range being 55 % to 65 %. This study was inadequate for the evaluation of left ventricular regional wall motion. - Aortic valve thickness was mildly increased.  Catheterization 2003 ANGIOGRAPHIC FINDINGS:  1. Left main coronary artery is free of significant flow-limiting coronary  atherosclerosis.  2. Left anterior descending is a medium caliber vessel with two small  diagonal branches. There is 30-40% ostial narrowing of the LAD with an  approximately 80% stenosis in the proximal portion of the vessel.  3. There is a ramus intermedius branch that has a 70% stenosis predominately  involving a superior branch of this vessel.  4. The circumflex coronary artery is a large vessel with a large  trifurcating obtuse marginal branch and small posterior descending  branch. There is a 20% stenosis in the mid vessel.  5. The right coronary artery is small and essentially nondominant with a  diffuse 90% mid vessel stenosis.  LEFT VENTRICULOGRAPHY: Left ventriculography performed in the RAO  projection reveals an ejection fraction of approximately 65-70% with no  focal wall motion abnormalities and no significant mitral  regurgitation.  DIAGNOSES:  1. Multivessel coronary artery disease as outlined with an 80% proximal left  anterior descending stenosis, 70% small ramus intermedius stenosis, and  diffuse 90% small nondominant right coronary artery stenosis.  2. Left ventricular ejection fraction of 65-70%.     Radiology: Ct Chest Wo C is currently in the abdomen thereontrast  11/03/2012   *RADIOLOGY REPORT*  Clinical Data: Chest pain. History of renal malignancy status post right nephrectomy.  Lung nodule.  CT CHEST WITHOUT CONTRAST  Technique:  Multidetector CT imaging of the chest was performed following the standard protocol without IV contrast.  Comparison: Chest x-ray 08/23/2005. 1 IMPRESSION: 1.  No acute findings in the thorax. 2. Atherosclerosis, including left main and three-vessel coronary artery disease. In addition, there is a large calcified plaque in the proximal left subclavian artery which likely results in hemodynamically significant stenosis (impossible to completely assess on today's noncontrast CT examination). 3.  No suspicious appearing pulmonary nodules or masses are identified to suggest metastatic disease of the thorax at this time. 4.  Scattered areas of mild scarring throughout the lung bases bilaterally, as above.   Original Report Authenticated By: Vinnie Langton, M.D.   Dg Chest Portable 1 View  11/03/2012   *RADIOLOGY REPORT*  Clinical Data: Centralized chest pain since this morning of at 3 am.  PORTABLE CHEST -   IMPRESSION: No evidence of active pulmonary disease.  Suggestion of developing nodular opacity in the right upper lung.  CT recommended.   Original Report Authenticated By: Lucienne Capers, M.D.   EKG:  NSR    ASSESSMENT AND PLAN:   1. Chest Pain:  Likely angina. No definite ACS by enzymes. Know history of CAD with triple vessel disease. Stents to LAD and PTCA to ramus. BP was elevated on admission. Recent medication changes noted, could have lost some antianginal  benefit with discontinuation of amlodipine. Her last ischemic workup was via Myoview in 2011  Plan for pharmacologic stress Myoview this week for further evaluation for progression of CAD. I have spoken with the patient concerning proceeding with the test and she is agreeable.  2. Hypertension: She is still mildy hypertensive on am recordings with the addition of amlodipine and increased dose of benazepril.  She is mildly anxious as well which might be playing into the readings. Continue medications as she is taking during this admission.  Close follow up after discharge with stress test.   Signed: Phill Myron. Purcell Nails NP [facility] 11/04/2012, 10:23 AM Co-Sign MD   Attending note:  Patient seen and examined. Reviewed available records and modified above note by Ms. Lawrence NP. Patient presents after episode of probable angina, recent medication adjustments are noted. She had been on Lotrel 5/20 mg daily with an additional amlodipine 5 mg in the evening. This was simplified to benazepril 5 mg only in light of symptoms of leg edema. ECG without significant ST segment changes and troponin I levels are normal arguing against ACS. She is comfortable this morning and would like to go home.  Recommendation is to reinstitute Lotrel 5/20 mg daily, leave off the evening amlodipine however. We will schedule a Lexiscan Myoview through the [place] office for followup evaluation of ischemic burden to help guide therapy, and a subsequent visit with Dr. Harrington Challenger for review. Situation discussed in detail with the patient's daughters.  Satira Sark, M.D., F.A.C.C.

## 2012-11-04 NOTE — Plan of Care (Signed)
Problem: Discharge Progression Outcomes Goal: Activity appropriate for discharge plan Outcome: Completed/Met Date Met:  11/04/12 Ambulated in hallway without difficulty. Goal: Other Discharge Outcomes/Goals Outcome: Completed/Met Date Met:  11/04/12 Cardiac stress test scheduled as outpatient on July 30,2014 at 11:00am.

## 2012-11-04 NOTE — Progress Notes (Signed)
Carly [name], PELLO NO.:  [id number]  MEDICAL RECORD NO.:  [id number]  LOCATION:  A327                          FACILITY:  [facility]  PHYSICIAN:  Estill Bamberg. Karie Kirks, M.D.DATE OF BIRTH:  [birth year]  DATE OF PROCEDURE: DATE OF DISCHARGE:                                PROGRESS NOTE   SUBJECTIVE:  This 79 year old developed substernal pressure in the chest when she woke up yesterday morning and was seen through the emergency room and admitted with a rule out MI.  She has a history of coronary disease.  Ten or 15 years ago she required a stent in the "widow maker" (according to her daughter) when she was taken care of by Dr. [name].  She does see [facility] cardiologist Dr. Harrington Challenger right now.  She also had a renal cell carcinoma about 7 years ago, but this was removed.  She is currently asymptomatic.  I talked to her daughters at length.  She had developed some swelling of her feet on amlodipine/benazepril, so her LMD, Dr. Everette Rank, stopped the amlodipine, instead put her on benazepril with HTZ about 4 days before the chest discomfort started.  OBJECTIVE:  GENERAL:  She is standing up in the room and walking.  She is elderly, now age 79.  Has mild thoracic kyphosis.  Her speech is normal.  Sensorium intact. VITAL SIGNS:  Temperature is 99 degrees, pulse 81, respiratory rate 18, blood pressure 154/68. HEART:  Today, her heart has a regular rhythm, rate of 80.  There are no murmurs. LUNGS:  Clear throughout and she is moving air well.  Her troponins have all been normal.  Her EKG really is unremarkable.  The CT of the chest showed diffuse calcification throughout all 3 coronary arteries.  She also has calcification in the subclavian artery. She has small calcified gallstones.  ASSESSMENT: 1. Unstable angina. 2. History of coronary artery disease, status post stent. 3. Renal cell carcinoma. 4. Chronic renal insufficiency. 5. Benign essential  hypertension. 6. Hyperlipidemia.  PLAN:  I am adding back amlodipine.  I have talked to the nurse practitioner at [facility].  The patient will be seen today and if stable will be discharged home, and otherwise transferred to [facility].  I have discussed all this with the patient and her daughters.     Estill Bamberg. Karie Kirks, M.D.     SDK/MEDQ  D:  11/04/2012  T:  11/04/2012  Job:  [id number]

## 2012-11-06 ENCOUNTER — Ambulatory Visit (HOSPITAL_COMMUNITY): Payer: Medicare Other | Attending: Cardiovascular Disease | Admitting: Radiology

## 2012-11-06 VITALS — BP 150/94 | Ht 61.5 in | Wt 122.0 lb

## 2012-11-06 DIAGNOSIS — I252 Old myocardial infarction: Secondary | ICD-10-CM | POA: Insufficient documentation

## 2012-11-06 DIAGNOSIS — R079 Chest pain, unspecified: Secondary | ICD-10-CM | POA: Insufficient documentation

## 2012-11-06 DIAGNOSIS — I2510 Atherosclerotic heart disease of native coronary artery without angina pectoris: Secondary | ICD-10-CM

## 2012-11-06 DIAGNOSIS — I6529 Occlusion and stenosis of unspecified carotid artery: Secondary | ICD-10-CM | POA: Insufficient documentation

## 2012-11-06 DIAGNOSIS — R0602 Shortness of breath: Secondary | ICD-10-CM

## 2012-11-06 DIAGNOSIS — Z85528 Personal history of other malignant neoplasm of kidney: Secondary | ICD-10-CM | POA: Insufficient documentation

## 2012-11-06 DIAGNOSIS — I10 Essential (primary) hypertension: Secondary | ICD-10-CM | POA: Insufficient documentation

## 2012-11-06 DIAGNOSIS — R0609 Other forms of dyspnea: Secondary | ICD-10-CM | POA: Insufficient documentation

## 2012-11-06 DIAGNOSIS — Z9861 Coronary angioplasty status: Secondary | ICD-10-CM | POA: Insufficient documentation

## 2012-11-06 DIAGNOSIS — R0989 Other specified symptoms and signs involving the circulatory and respiratory systems: Secondary | ICD-10-CM | POA: Insufficient documentation

## 2012-11-06 MED ORDER — TECHNETIUM TC 99M SESTAMIBI GENERIC - CARDIOLITE
11.0000 | Freq: Once | INTRAVENOUS | Status: AC | PRN
  Administered 2012-11-06: 11 via INTRAVENOUS

## 2012-11-06 MED ORDER — AMINOPHYLLINE 25 MG/ML IV SOLN
150.0000 mg | Freq: Two times a day (BID) | INTRAVENOUS | Status: DC | PRN
  Administered 2012-11-06: 150 mg via INTRAVENOUS

## 2012-11-06 MED ORDER — TECHNETIUM TC 99M SESTAMIBI GENERIC - CARDIOLITE
33.0000 | Freq: Once | INTRAVENOUS | Status: AC | PRN
  Administered 2012-11-06: 33 via INTRAVENOUS

## 2012-11-06 MED ORDER — REGADENOSON 0.4 MG/5ML IV SOLN
0.4000 mg | Freq: Once | INTRAVENOUS | Status: AC
  Administered 2012-11-06: 0.4 mg via INTRAVENOUS

## 2012-11-06 NOTE — Progress Notes (Signed)
[facility] [address] [place], [place] [address] [phone]    Cardiology Nuclear Med Study  Carly [name] is a 79 y.o. female     MRN : [id number]     DOB: 1918/10/26  Procedure Date: 11/06/2012  Nuclear Med Background Indication for Stress Test:  Evaluation for Ischemia and PTCA/Stent Patency History:  '03 MI-NSTEMI-Heart Cath-Angioplasty RI ECHO: EF: 55-65%, 06/22/09 MPS: NL EF: 83%, 11/03/12 CT- 3V Dz  diffuse calcification subendocardial art. Renal Cell Ca Cardiac Risk Factors: Carotid Disease, Hypertension and Lipids  Symptoms:  Chest Pain, Chest Pressure, DOE and SOB   Nuclear Pre-Procedure Caffeine/Decaff Intake: 6:00am 1/2 cup decaffeinated coffee NPO After: 6:00am    Lungs:  clear O2 Sat: 96% on room air. IV 0.9% NS with Angio Cath:  22g  IV Site: L Antecubital x 1, tolerated well IV Started by:  Irven Baltimore, RN  Chest Size (in):  38 Cup Size: D  Height: 5' 1.5" (1.562 m)  Weight:  122 lb (55.339 kg)  BMI:  Body mass index is 22.68 kg/(m^2). Tech Comments:  Metoprolol this am. This patient had multiple symptoms with the Lexiscan injection.She was given Aminophylline 150 mg given and all symptoms were relieved.     Nuclear Med Study 1 or 2 day study: 1 day  Stress Test Type:  Carlton Adam  Reading MD: Jenkins Rouge, MD  Order Authorizing Provider:  Dorris Carnes, MD  Resting Radionuclide: Technetium 87m Sestamibi  Resting Radionuclide Dose: 10.8 mCi   Stress Radionuclide:  Technetium 56m Sestamibi  Stress Radionuclide Dose: 33.0 mCi           Stress Protocol Rest HR: 71 Stress HR: 93  Rest BP: 150/94 Stress BP: 177/73  Exercise Time (min): n/a METS: n/a   Predicted Max HR: 128 bpm % Max HR: 55.47 bpm Rate Pressure Product: 12567   Dose of Adenosine (mg):  n/a Dose of Lexiscan: 0.4 mg  Dose of Atropine (mg): n/a Dose of Dobutamine: n/a mcg/kg/min (at max HR)  Stress Test Technologist: Perrin Maltese, EMT-P  Nuclear Technologist:   Charlton Amor, CNMT     Rest Procedure:  Myocardial perfusion imaging was performed at rest 45 minutes following the intravenous administration of Technetium 85m Sestamibi. Rest ECG: NSR with flat / horizontal lateral ST segments  Stress Procedure:  The patient received IV Lexiscan 0.4 mg over 15-seconds.  Technetium 66m Sestamibi injected at 30-seconds. This patient was woozy, headache, nausea, and just didn't feel good with the Lexiscan injection. Quantitative spect images were obtained after a 45 minute delay. Stress ECG: No significant change from baseline ECG  QPS Raw Data Images:  Normal; no motion artifact; normal heart/lung ratio. Stress Images:  Normal homogeneous uptake in all areas of the myocardium. Rest Images:  Normal homogeneous uptake in all areas of the myocardium. Subtraction (SDS):  Normal Transient Ischemic Dilatation (Normal <1.22):  n/a Lung/Heart Ratio (Normal <0.45):  0.32  Quantitative Gated Spect Images QGS EDV:  26 ml QGS ESV:  3 ml  Impression Exercise Capacity:  Lexiscan with no exercise. BP Response:  Normal blood pressure response. Clinical Symptoms:  No significant symptoms noted. ECG Impression:  No significant ST segment change suggestive of ischemia. Comparison with Prior Nuclear Study: No images to compare  Overall Impression:  Normal stress nuclear study.  LV Ejection Fraction: 87%.  LV Wall Motion:  NL LV Function; NL Wall Motion  Jenkins Rouge

## 2012-11-06 NOTE — Discharge Summary (Signed)
NAMEJAZZI, PASTRAN NO.:  [id number]  MEDICAL RECORD NO.:  [id number]  LOCATION:  A327                          FACILITY:  [facility]  PHYSICIAN:  Estill Bamberg. Karie Kirks, M.D.DATE OF BIRTH:  [birth year]  DATE OF ADMISSION:  11/03/2012 DATE OF DISCHARGE:  07/28/2014LH                              DISCHARGE SUMMARY   HISTORY:  This is a 79 year old female who was admitted to the hospital with chest discomfort.  She stayed overnight.  Troponins stayed normal. She had a cardiac evaluation and was ready for discharge home after overnight course.  She did have a history of coronary artery disease and by her family's history, had left anterior descending disease with a stent placed 10 to 15 years ago.  I was unable to find this information on the electronic medical record, but it was done by Dr. Raymond Gurney of [facility].  Her CBC was essentially normal.  Her BUN was 38, creatinine 1.60, and lipid panel was acceptable with an HDL 63, LDL 47.  Sugar was 102.  CT of the chest showed no acute findings in the thorax, but atherosclerosis and focal occluding the left main and 3-vessel coronary artery disease.  There was a large calcified plaque in the proximal left subclavian artery, felt to be possibly resulting in hemodynamically significant stenosis.  Portable chest x-ray was felt to be normal.  There was a question of a nodular opacity in the right upper lung, but this was not seen on the CT.  EKG showed a normal sinus rhythm.  There have been some changes in her blood pressure medicines before hospitalization.  Due to swelling in the legs, the amlodipine had been stopped and she has been put on benazepril and HCTZ, as sole blood pressure meds.  At the time of discharge, it was arranged for her to have amlodipine/benazepril 5/20 per day and to continue with HCTZ a day.  Cardiology is setting her up for a pharmacological stress test in the near  future.  FINAL DISCHARGE DIAGNOSES: 1. Unstable angina. 2. History of stenting of the left anterior descending coronary artery     many years ago. 3. Renal cell carcinoma status post surgery. 4. Renal insufficiency. 5. Benign essential hypertension. 6. Hyperlipidemia.     Estill Bamberg. Karie Kirks, M.D.     SDK/MEDQ  D:  11/05/2012  T:  11/06/2012  Job:  [id number]

## 2012-11-07 ENCOUNTER — Other Ambulatory Visit: Payer: Self-pay | Admitting: Internal Medicine

## 2012-11-11 ENCOUNTER — Ambulatory Visit (INDEPENDENT_AMBULATORY_CARE_PROVIDER_SITE_OTHER): Payer: Medicare Other | Admitting: Internal Medicine

## 2012-11-11 ENCOUNTER — Encounter: Payer: Self-pay | Admitting: Internal Medicine

## 2012-11-11 VITALS — BP 173/80 | HR 81 | Ht 61.5 in | Wt 124.0 lb

## 2012-11-11 DIAGNOSIS — I10 Essential (primary) hypertension: Secondary | ICD-10-CM

## 2012-11-11 NOTE — Progress Notes (Signed)
HPI Patient is a 79 year old with a history of CAD (s/p PTCA/Stent to the LAD and PTCA to ramus in 2003). I saw her in clinic this winter.  Patient says that she complained of edema in legs prior to admssion.  AMdlodipine was stopped and HCTZ was increased It was after this that she developed CP  Admttted to [facility]  R/O for MI  Amlodipine was resumed at once daily dose.  HCTZ continued at BID Since d/c she has had not CP Legs are sl swollen   Myoview done which was normal  Patient denies CP  Breathing is OK   BP cuff from home correlates with BP of daughter.  BPs at home are 130s to 150 .No Known Allergies  Current Outpatient Prescriptions  Medication Sig Dispense Refill  . allopurinol (ZYLOPRIM) 100 MG tablet TAKE ONE TABLET DAILY      . amLODipine-benazepril (LOTREL) 10-20 MG per capsule Take 1 capsule by mouth daily.      Marland Kitchen aspirin 81 MG tablet Take 81 mg by mouth daily.        . hydrochlorothiazide (HYDRODIURIL) 25 MG tablet       . metoprolol (TOPROL-XL) 50 MG 24 hr tablet Take 50 mg by mouth daily.        . nitroGLYCERIN (NITROSTAT) 0.4 MG SL tablet Place 1 tablet (0.4 mg total) under the tongue every 5 (five) minutes as needed for chest pain.  25 tablet  3  . raloxifene (EVISTA) 60 MG tablet Take 60 mg by mouth daily.        . simvastatin (ZOCOR) 10 MG tablet TAKE (1) TABLET BY MOUTH AT BEDTIME.  30 tablet  3   No current facility-administered medications for this visit.    Past Medical History  Diagnosis Date  . HTN (hypertension)   . Gout   . Dyslipidemia   . CAD (coronary artery disease)   . Cystocele   . Renal cell cancer   . Renal insufficiency   . Vaginal wall prolapse   . Hypercholesteremia     Past Surgical History  Procedure Laterality Date  . Nephrectomy      right  . Appendectomy      Family History  Problem Relation Age of Onset  . Cancer    . Heart disease      History   Social History  . Marital Status: Widowed    Spouse Name: N/A    Number  of Children: 2  . Years of Education: N/A   Occupational History  . Not on file.   Social History Main Topics  . Smoking status: Never Smoker   . Smokeless tobacco: Never Used  . Alcohol Use: No  . Drug Use: No  . Sexually Active: No   Other Topics Concern  . Not on file   Social History Narrative  . No narrative on file    Review of Systems:  All systems reviewed.  They are negative to the above problem except as previously stated.  Vital Signs: BP 173/80  Pulse 81  Ht 5' 1.5" (1.562 m)  Wt 124 lb (56.246 kg)  BMI 23.05 kg/m2  SpO2 98%  Physical Exam Patient is in NAD HEENT:  Normocephalic, atraumatic. EOMI, PERRLA.  Neck: JVP is normal.  No bruits.  Lungs: clear to auscultation. No rales no wheezes.  Heart: Regular rate and rhythm. Normal S1, S2. No S3.   No significant murmurs. PMI not displaced.  Abdomen:  Supple, nontender. Normal  bowel sounds. No masses. No hepatomegaly.  Extremities:   Good distal pulses throughout. Tr lower extremity edema.  Musculoskeletal :moving all extremities.  Neuro:   alert and oriented x3.  CN II-XII grossly intact.   Assessment and Plan:  1.  HTN  Adequate control at home  Keep on same regimen  Get BMET Follow home BPs  I have recomm that she bring cuff to clinic visits on fairly regular basis along with home log  2.  CAD  Recent CP when amlodipine stopped.  Now gone  Back on amlodipine Myoview normal  3.  Edema.  Encouraged her to try support socks.  F/U already scheduled for Sept  Keep appt.

## 2012-11-11 NOTE — Patient Instructions (Addendum)
Your physician recommends that you schedule a follow-up appointment in: [place] ON 12-27-12  Your physician recommends that you return for lab work in: TODAY ([place] BMET)

## 2012-11-12 LAB — BASIC METABOLIC PANEL
CO2: 23 mEq/L (ref 19–32)
Calcium: 9.4 mg/dL (ref 8.4–10.5)
Glucose, Bld: 96 mg/dL (ref 70–99)
Potassium: 5.1 mEq/L (ref 3.5–5.3)
Sodium: 138 mEq/L (ref 135–145)

## 2012-11-13 ENCOUNTER — Other Ambulatory Visit (HOSPITAL_COMMUNITY): Payer: Self-pay | Admitting: Oncology

## 2012-12-03 ENCOUNTER — Other Ambulatory Visit: Payer: Self-pay | Admitting: Internal Medicine

## 2012-12-27 ENCOUNTER — Encounter: Payer: Self-pay | Admitting: Internal Medicine

## 2012-12-27 ENCOUNTER — Ambulatory Visit (INDEPENDENT_AMBULATORY_CARE_PROVIDER_SITE_OTHER): Payer: Medicare Other | Admitting: Internal Medicine

## 2012-12-27 VITALS — BP 158/78 | HR 70 | Ht 61.5 in | Wt 126.0 lb

## 2012-12-27 DIAGNOSIS — I10 Essential (primary) hypertension: Secondary | ICD-10-CM

## 2012-12-27 NOTE — Patient Instructions (Addendum)
Lab Work today    Your physician recommends that you schedule a follow-up appointment in: end of 12/14

## 2012-12-27 NOTE — Progress Notes (Signed)
HPI Patient is a 79 year old with a history of CAD (s/p PTCA/Stent to the LAD and PTCA to ramus in 2003).  Sestamibi this summer was negative fir ischemia The patient was last in clinic in August that was negative for ischemia. I had wanted to see her back to make sure she was feeling OK and her BP was OK BP at home is 110s to 150s.  She denies CP  Breathing is OK  Is slowing some with age.  No Known Allergies  Current Outpatient Prescriptions  Medication Sig Dispense Refill  . allopurinol (ZYLOPRIM) 100 MG tablet TAKE ONE TABLET BY MOUTH ONCE DAILY.  30 tablet  5  . amLODipine-benazepril (LOTREL) 5-20 MG per capsule TAKE (1) CAPSULE BY MOUTH ONCE DAILY.  30 capsule  1  . aspirin 81 MG tablet Take 81 mg by mouth daily.        . hydrochlorothiazide (HYDRODIURIL) 25 MG tablet Take 25 mg by mouth 2 (two) times daily.       . metoprolol (TOPROL-XL) 50 MG 24 hr tablet Take 50 mg by mouth daily.        . nitroGLYCERIN (NITROSTAT) 0.4 MG SL tablet Place 1 tablet (0.4 mg total) under the tongue every 5 (five) minutes as needed for chest pain.  25 tablet  3  . raloxifene (EVISTA) 60 MG tablet Take 60 mg by mouth daily.        . simvastatin (ZOCOR) 10 MG tablet TAKE (1) TABLET BY MOUTH AT BEDTIME.  30 tablet  3   No current facility-administered medications for this visit.    Past Medical History  Diagnosis Date  . HTN (hypertension)   . Gout   . Dyslipidemia   . CAD (coronary artery disease)   . Cystocele   . Renal cell cancer   . Renal insufficiency   . Vaginal wall prolapse   . Hypercholesteremia     Past Surgical History  Procedure Laterality Date  . Nephrectomy      right  . Appendectomy      Family History  Problem Relation Age of Onset  . Cancer    . Heart disease      History   Social History  . Marital Status: Widowed    Spouse Name: N/A    Number of Children: 2  . Years of Education: N/A   Occupational History  . Not on file.   Social History Main Topics   . Smoking status: Never Smoker   . Smokeless tobacco: Never Used  . Alcohol Use: No  . Drug Use: No  . Sexual Activity: No   Other Topics Concern  . Not on file   Social History Narrative  . No narrative on file    Review of Systems:  All systems reviewed.  They are negative to the above problem except as previously stated.  Vital Signs: BP 158/78  Pulse 70  Ht 5' 1.5" (1.562 m)  Wt 126 lb (57.153 kg)  BMI 23.42 kg/m2  Physical Exam Patient is in NAD HEENT:  Normocephalic, atraumatic. EOMI, PERRLA.  Neck: JVP is normal.  No bruits.  Lungs: clear to auscultation. No rales no wheezes.  Heart: Regular rate and rhythm. Normal S1, S2. No S3.   No significant murmurs. PMI not displaced.  Abdomen:  Supple, nontender. Normal bowel sounds. No masses. No hepatomegaly.  Extremities:   Good distal pulses throughout. No lower extremity edema.  Musculoskeletal :moving all extremities.  Neuro:   alert  and oriented x3.  CN II-XII grossly intact.    Assessment and Plan:  1.  CAD  No symptoms of angina.  Continue medical Rx  2.  HTN    3.  HL  WIll need to follw  Continue statin.

## 2012-12-28 LAB — BASIC METABOLIC PANEL
Calcium: 9.5 mg/dL (ref 8.4–10.5)
Glucose, Bld: 86 mg/dL (ref 70–99)
Potassium: 4.4 mEq/L (ref 3.5–5.3)
Sodium: 139 mEq/L (ref 135–145)

## 2012-12-30 ENCOUNTER — Other Ambulatory Visit: Payer: Self-pay

## 2013-01-24 ENCOUNTER — Other Ambulatory Visit: Payer: Self-pay | Admitting: Internal Medicine

## 2013-01-31 ENCOUNTER — Other Ambulatory Visit: Payer: Self-pay | Admitting: Internal Medicine

## 2013-04-05 ENCOUNTER — Other Ambulatory Visit: Payer: Self-pay | Admitting: Internal Medicine

## 2013-04-22 ENCOUNTER — Other Ambulatory Visit: Payer: Self-pay | Admitting: Internal Medicine

## 2013-04-28 ENCOUNTER — Ambulatory Visit (INDEPENDENT_AMBULATORY_CARE_PROVIDER_SITE_OTHER): Payer: Medicare Other | Admitting: Internal Medicine

## 2013-04-28 ENCOUNTER — Encounter: Payer: Self-pay | Admitting: Internal Medicine

## 2013-04-28 VITALS — BP 154/75 | HR 83 | Ht 61.5 in | Wt 124.0 lb

## 2013-04-28 DIAGNOSIS — E785 Hyperlipidemia, unspecified: Secondary | ICD-10-CM | POA: Diagnosis not present

## 2013-04-28 DIAGNOSIS — I25810 Atherosclerosis of coronary artery bypass graft(s) without angina pectoris: Secondary | ICD-10-CM | POA: Diagnosis not present

## 2013-04-28 DIAGNOSIS — I10 Essential (primary) hypertension: Secondary | ICD-10-CM | POA: Diagnosis not present

## 2013-04-28 NOTE — Progress Notes (Signed)
HPI Patient is a 80 year old with a history of CAD (s/p PTCA/Stent to the LAD and PTCA to ramus in 2003).  Sestamibi this summer was negative fir ischemia The patient was last in clinic in August that was negative for ischemia. Since seen She denies CP  Breathing is OK  Is slowing some with age.  No Known Allergies  Current Outpatient Prescriptions  Medication Sig Dispense Refill  . allopurinol (ZYLOPRIM) 100 MG tablet TAKE ONE TABLET BY MOUTH ONCE DAILY.  30 tablet  5  . amLODipine-benazepril (LOTREL) 5-20 MG per capsule TAKE (1) CAPSULE BY MOUTH ONCE DAILY.  30 capsule  0  . aspirin 81 MG tablet Take 81 mg by mouth daily.        . hydrochlorothiazide (HYDRODIURIL) 25 MG tablet Take 25 mg by mouth 2 (two) times daily.       . metoprolol (TOPROL-XL) 50 MG 24 hr tablet Take 50 mg by mouth daily.        . nitroGLYCERIN (NITROSTAT) 0.4 MG SL tablet Place 1 tablet (0.4 mg total) under the tongue every 5 (five) minutes as needed for chest pain.  25 tablet  3  . raloxifene (EVISTA) 60 MG tablet Take 60 mg by mouth daily.        . simvastatin (ZOCOR) 10 MG tablet TAKE (1) TABLET BY MOUTH AT BEDTIME.  30 tablet  0   No current facility-administered medications for this visit.    Past Medical History  Diagnosis Date  . HTN (hypertension)   . Gout   . Dyslipidemia   . CAD (coronary artery disease)   . Cystocele   . Renal cell cancer   . Renal insufficiency   . Vaginal wall prolapse   . Hypercholesteremia     Past Surgical History  Procedure Laterality Date  . Nephrectomy      right  . Appendectomy      Family History  Problem Relation Age of Onset  . Cancer    . Heart disease      History   Social History  . Marital Status: Widowed    Spouse Name: N/A    Number of Children: 2  . Years of Education: N/A   Occupational History  . Not on file.   Social History Main Topics  . Smoking status: Never Smoker   . Smokeless tobacco: Never Used  . Alcohol Use: No  . Drug Use:  No  . Sexual Activity: No   Other Topics Concern  . Not on file   Social History Narrative  . No narrative on file    Review of Systems:  All systems reviewed.  They are negative to the above problem except as previously stated.  Vital Signs: BP 154/75  Pulse 83  Ht 5' 1.5" (1.562 m)  Wt 124 lb (56.246 kg)  BMI 23.05 kg/m2  Physical Exam Patient is in NAD HEENT:  Normocephalic, atraumatic. EOMI, PERRLA.  Neck: JVP is normal.  No bruits.  Lungs: clear to auscultation. No rales no wheezes.  Heart: Regular rate and rhythm. Normal S1, S2. No S3.   No significant murmurs. PMI not displaced.  Abdomen:  Supple, nontender. Normal bowel sounds. No masses. No hepatomegaly.  Extremities:   Good distal pulses throughout. No lower extremity edema.  Musculoskeletal :moving all extremities.  Neuro:   alert and oriented x3.  CN II-XII grossly intact.    Assessment and Plan:  1.  CAD  No symptoms of angina.  Continue  medical Rx  2.  HTN  Good control overall  130s to 140s at home    3.  HL  WIll need to follw  Continue statin.

## 2013-04-28 NOTE — Patient Instructions (Signed)
Your physician wants you to follow-up in: 7 months You will receive a reminder letter in the mail two months in advance. If you don't receive a letter, please call our office to schedule the follow-up appointment.   Your physician recommends that you continue on your current medications as directed. Please refer to the Current Medication list given to you today.

## 2013-05-02 ENCOUNTER — Other Ambulatory Visit: Payer: Self-pay | Admitting: Internal Medicine

## 2013-05-23 ENCOUNTER — Other Ambulatory Visit: Payer: Self-pay | Admitting: Internal Medicine

## 2013-07-29 ENCOUNTER — Other Ambulatory Visit: Payer: Self-pay | Admitting: *Deleted

## 2013-07-29 MED ORDER — AMLODIPINE BESY-BENAZEPRIL HCL 5-20 MG PO CAPS: 1.0000 | ORAL_CAPSULE | Freq: Every day | ORAL | Status: DC

## 2013-08-22 ENCOUNTER — Ambulatory Visit (INDEPENDENT_AMBULATORY_CARE_PROVIDER_SITE_OTHER): Payer: Medicare Other | Admitting: Urology

## 2013-08-22 DIAGNOSIS — C659 Malignant neoplasm of unspecified renal pelvis: Secondary | ICD-10-CM

## 2013-08-22 DIAGNOSIS — N289 Disorder of kidney and ureter, unspecified: Secondary | ICD-10-CM | POA: Diagnosis not present

## 2013-09-02 DIAGNOSIS — J069 Acute upper respiratory infection, unspecified: Secondary | ICD-10-CM | POA: Diagnosis not present

## 2013-09-02 DIAGNOSIS — J209 Acute bronchitis, unspecified: Secondary | ICD-10-CM | POA: Diagnosis not present

## 2013-09-08 DIAGNOSIS — J209 Acute bronchitis, unspecified: Secondary | ICD-10-CM | POA: Diagnosis not present

## 2013-09-08 DIAGNOSIS — J069 Acute upper respiratory infection, unspecified: Secondary | ICD-10-CM | POA: Diagnosis not present

## 2013-10-22 ENCOUNTER — Other Ambulatory Visit: Payer: Self-pay | Admitting: Internal Medicine

## 2013-11-10 ENCOUNTER — Encounter: Payer: Self-pay | Admitting: Internal Medicine

## 2013-11-10 ENCOUNTER — Ambulatory Visit (INDEPENDENT_AMBULATORY_CARE_PROVIDER_SITE_OTHER): Payer: Medicare Other | Admitting: Internal Medicine

## 2013-11-10 VITALS — BP 144/77 | HR 77 | Ht 61.0 in | Wt 124.0 lb

## 2013-11-10 DIAGNOSIS — I209 Angina pectoris, unspecified: Secondary | ICD-10-CM

## 2013-11-10 DIAGNOSIS — I10 Essential (primary) hypertension: Secondary | ICD-10-CM

## 2013-11-10 DIAGNOSIS — I25810 Atherosclerosis of coronary artery bypass graft(s) without angina pectoris: Secondary | ICD-10-CM

## 2013-11-10 DIAGNOSIS — E785 Hyperlipidemia, unspecified: Secondary | ICD-10-CM

## 2013-11-10 DIAGNOSIS — I25709 Atherosclerosis of coronary artery bypass graft(s), unspecified, with unspecified angina pectoris: Secondary | ICD-10-CM

## 2013-11-10 LAB — BASIC METABOLIC PANEL
BUN: 30 mg/dL — AB (ref 6–23)
CALCIUM: 8.9 mg/dL (ref 8.4–10.5)
CO2: 24 mEq/L (ref 19–32)
Chloride: 106 mEq/L (ref 96–112)
Creatinine, Ser: 1.6 mg/dL — ABNORMAL HIGH (ref 0.4–1.2)
GFR: 32.66 mL/min — AB (ref >60.00)
Glucose, Bld: 101 mg/dL — ABNORMAL HIGH (ref 70–99)
Potassium: 4.3 mEq/L (ref 3.5–5.1)
SODIUM: 135 meq/L (ref 135–145)

## 2013-11-10 LAB — CBC WITH DIFFERENTIAL/PLATELET
Basophils Absolute: 0 10*3/uL (ref 0.0–0.1)
Basophils Relative: 0.2 % (ref 0.0–3.0)
Eosinophils Absolute: 0.4 10*3/uL (ref 0.0–0.7)
Eosinophils Relative: 4.3 % (ref 0.0–5.0)
HEMATOCRIT: 40.3 % (ref 36.0–46.0)
Hemoglobin: 13.2 g/dL (ref 12.0–15.0)
LYMPHS ABS: 3 10*3/uL (ref 0.7–4.0)
Lymphocytes Relative: 32.7 % (ref 12.0–46.0)
MCHC: 32.7 g/dL (ref 30.0–36.0)
MCV: 98.6 fl (ref 78.0–100.0)
MONO ABS: 0.5 10*3/uL (ref 0.1–1.0)
Monocytes Relative: 5.8 % (ref 3.0–12.0)
Neutro Abs: 5.3 10*3/uL (ref 1.4–7.7)
Neutrophils Relative %: 57 % (ref 43.0–77.0)
PLATELETS: 175 10*3/uL (ref 150.0–400.0)
RBC: 4.09 Mil/uL (ref 3.87–5.11)
RDW: 15.6 % — ABNORMAL HIGH (ref 11.5–15.5)
WBC: 9.3 10*3/uL (ref 4.0–10.5)

## 2013-11-10 LAB — LIPID PANEL
Cholesterol: 152 mg/dL (ref 0–200)
HDL: 51.1 mg/dL (ref >39.00)
NonHDL: 100.9
Total CHOL/HDL Ratio: 3
Triglycerides: 216 mg/dL — ABNORMAL HIGH (ref 0.0–149.0)
VLDL: 43.2 mg/dL — ABNORMAL HIGH (ref 0.0–40.0)

## 2013-11-10 LAB — AST: AST: 26 U/L (ref 0–37)

## 2013-11-10 LAB — LDL CHOLESTEROL, DIRECT: Direct LDL: 72.6 mg/dL

## 2013-11-10 NOTE — Progress Notes (Signed)
HPI Patient is a 80 year old with a history of CAD (s/p PTCA/Stent to the LAD and PTCA to ramus in 2003).  Sestamibi  was negative fir ischemia The patient was last in clinic in January  Doing well  NO CP  Breathing is good  Does usual activities   No Known Allergies  Current Outpatient Prescriptions  Medication Sig Dispense Refill  . allopurinol (ZYLOPRIM) 100 MG tablet TAKE ONE TABLET BY MOUTH ONCE DAILY.  30 tablet  5  . amLODipine-benazepril (LOTREL) 5-20 MG per capsule Take 1 capsule by mouth daily.  90 capsule  3  . aspirin 81 MG tablet Take 81 mg by mouth daily.        . hydrochlorothiazide (HYDRODIURIL) 25 MG tablet Take 25 mg by mouth 2 (two) times daily.       . metoprolol (TOPROL-XL) 50 MG 24 hr tablet Take 50 mg by mouth daily.        . nitroGLYCERIN (NITROSTAT) 0.4 MG SL tablet Place 1 tablet (0.4 mg total) under the tongue every 5 (five) minutes as needed for chest pain.  25 tablet  3  . raloxifene (EVISTA) 60 MG tablet Take 60 mg by mouth daily.        . simvastatin (ZOCOR) 10 MG tablet TAKE (1) TABLET BY MOUTH AT BEDTIME.  30 tablet  0   No current facility-administered medications for this visit.    Past Medical History  Diagnosis Date  . HTN (hypertension)   . Gout   . Dyslipidemia   . CAD (coronary artery disease)   . Cystocele   . Renal cell cancer   . Renal insufficiency   . Vaginal wall prolapse   . Hypercholesteremia     Past Surgical History  Procedure Laterality Date  . Nephrectomy      right  . Appendectomy      Family History  Problem Relation Age of Onset  . Cancer    . Heart disease      History   Social History  . Marital Status: Widowed    Spouse Name: N/A    Number of Children: 2  . Years of Education: N/A   Occupational History  . Not on file.   Social History Main Topics  . Smoking status: Never Smoker   . Smokeless tobacco: Never Used  . Alcohol Use: No  . Drug Use: No  . Sexual Activity: No   Other Topics Concern  .  Not on file   Social History Narrative  . No narrative on file    Review of Systems:  All systems reviewed.  They are negative to the above problem except as previously stated.  Vital Signs: BP 144/77  Pulse 77  Ht 5\' 1"  (1.549 m)  Wt 124 lb (56.246 kg)  BMI 23.44 kg/m2  Physical Exam Patient is in NAD HEENT:  Normocephalic, atraumatic. EOMI, PERRLA.  Neck: JVP is normal.  No bruits.  Lungs: clear to auscultation. No rales no wheezes.  Heart: Regular rate and rhythm. Normal S1, S2. No S3.   No significant murmurs. PMI not displaced.  Abdomen:  Supple, nontender. Normal bowel sounds. No masses. No hepatomegaly.  Extremities:   Good distal pulses throughout. No lower extremity edema.  Musculoskeletal :moving all extremities.  Neuro:   alert and oriented x3.  CN II-XII grossly intact.   EKG  SR 69 bpm   Assessment and Plan:  1.  CAD  No symptoms of angina.  Continue medical  Rx  2.  HTN  Good control overall   Check labs     3.  HL  Continue statin.  Check labs   F/U end of Feb or march

## 2013-11-10 NOTE — Patient Instructions (Addendum)
Your physician recommends that you continue on your current medications as directed. Please refer to the Current Medication list given to you today.  Labs today: CBCD, BMET, Lipid profile, AST  Your physician wants you to follow-up in: 8 months with Dr. Harrington Challenger.  You will receive a reminder letter in the mail two months in advance. If you don't receive a letter, please call our office to schedule the follow-up appointment.

## 2013-12-17 ENCOUNTER — Other Ambulatory Visit: Payer: Self-pay | Admitting: Internal Medicine

## 2013-12-26 ENCOUNTER — Other Ambulatory Visit: Payer: Self-pay | Admitting: Internal Medicine

## 2014-01-13 DIAGNOSIS — Z23 Encounter for immunization: Secondary | ICD-10-CM | POA: Diagnosis not present

## 2014-03-04 DIAGNOSIS — I6789 Other cerebrovascular disease: Secondary | ICD-10-CM | POA: Diagnosis not present

## 2014-03-04 DIAGNOSIS — R1310 Dysphagia, unspecified: Secondary | ICD-10-CM | POA: Diagnosis not present

## 2014-03-28 ENCOUNTER — Other Ambulatory Visit: Payer: Self-pay | Admitting: Internal Medicine

## 2014-04-29 ENCOUNTER — Other Ambulatory Visit: Payer: Self-pay | Admitting: Internal Medicine

## 2014-05-10 NOTE — Progress Notes (Signed)
HPI Patient is a 81 year old with a history of CAD (s/p PTCA/Stent to the LAD and PTCA to ramus in 2003).  Sestamibi  was negative fir ischemia The patient was last in clinic in August 2015  Since seen she has done fairly well  Has had occasional CP at night  Pushed alert button  Not during day time or with activity Denies SOB  Not too actve  Walks around house No Known Allergies  Current Outpatient Prescriptions  Medication Sig Dispense Refill  . allopurinol (ZYLOPRIM) 100 MG tablet TAKE ONE TABLET BY MOUTH ONCE DAILY. 30 tablet 5  . amLODipine-benazepril (LOTREL) 5-20 MG per capsule Take 1 capsule by mouth daily. 90 capsule 3  . aspirin 81 MG tablet Take 81 mg by mouth daily.      . hydrochlorothiazide (HYDRODIURIL) 25 MG tablet TAKE TWO TABLETS BY MOUTH DAILY. 180 tablet 1  . metoprolol (TOPROL-XL) 50 MG 24 hr tablet Take 50 mg by mouth daily.      . nitroGLYCERIN (NITROSTAT) 0.4 MG SL tablet Place 1 tablet (0.4 mg total) under the tongue every 5 (five) minutes as needed for chest pain. 25 tablet 3  . raloxifene (EVISTA) 60 MG tablet Take 60 mg by mouth daily.      . simvastatin (ZOCOR) 10 MG tablet TAKE (1) TABLET BY MOUTH AT BEDTIME. 30 tablet 5   No current facility-administered medications for this visit.    Past Medical History  Diagnosis Date  . HTN (hypertension)   . Gout   . Dyslipidemia   . CAD (coronary artery disease)   . Cystocele   . Renal cell cancer   . Renal insufficiency   . Vaginal wall prolapse   . Hypercholesteremia     Past Surgical History  Procedure Laterality Date  . Nephrectomy      right  . Appendectomy      Family History  Problem Relation Age of Onset  . Cancer    . Heart disease    . Heart attack Father   . Diabetes Father     History   Social History  . Marital Status: Widowed    Spouse Name: N/A    Number of Children: 2  . Years of Education: N/A   Occupational History  . Not on file.   Social History Main Topics  .  Smoking status: Never Smoker   . Smokeless tobacco: Never Used  . Alcohol Use: No  . Drug Use: No  . Sexual Activity: No   Other Topics Concern  . Not on file   Social History Narrative    Review of Systems:  All systems reviewed.  They are negative to the above problem except as previously stated.  Vital Signs: BP 168/78 mmHg  Pulse 76  Ht 5\' 1"  (1.549 m)  Wt 121 lb 1.9 oz (54.94 kg)  BMI 22.90 kg/m2  Physical Exam Patient is in NAD HEENT:  Normocephalic, atraumatic. EOMI, PERRLA.  Neck: JVP is normal.  No bruits.  Lungs: clear to auscultation. No rales no wheezes.  Heart: Regular rate and rhythm. Normal S1, S2. No S3.   No significant murmurs. PMI not displaced.  Abdomen:  Supple, nontender. Normal bowel sounds. No masses. No hepatomegaly.  Extremities:   Good distal pulses throughout. No lower extremity edema.  Musculoskeletal :moving all extremities.  Neuro:   alert and oriented x3.  CN II-XII grossly intact.   Assessment and Plan:  1.  CAD I am not convinced  CP due to coronary ischemia  Should be having with activity      2.  HTN  BP is up here but had been good at home 120s to 140s  No change in meds  I encouraged her to bring cuff to next doctors appt    3.  HL  Continue statin.  Labs checked in [place] were OK   F/U in September

## 2014-05-11 ENCOUNTER — Ambulatory Visit (INDEPENDENT_AMBULATORY_CARE_PROVIDER_SITE_OTHER): Payer: Medicare Other | Admitting: Internal Medicine

## 2014-05-11 ENCOUNTER — Encounter: Payer: Self-pay | Admitting: Internal Medicine

## 2014-05-11 VITALS — BP 168/78 | HR 76 | Ht 61.0 in | Wt 121.1 lb

## 2014-05-11 DIAGNOSIS — I2510 Atherosclerotic heart disease of native coronary artery without angina pectoris: Secondary | ICD-10-CM

## 2014-05-11 DIAGNOSIS — I10 Essential (primary) hypertension: Secondary | ICD-10-CM

## 2014-05-11 LAB — BASIC METABOLIC PANEL
BUN: 48 mg/dL — ABNORMAL HIGH (ref 6–23)
CALCIUM: 9.3 mg/dL (ref 8.4–10.5)
CO2: 22 meq/L (ref 19–32)
Chloride: 109 mEq/L (ref 96–112)
Creatinine, Ser: 1.88 mg/dL — ABNORMAL HIGH (ref 0.40–1.20)
GFR: 26.5 mL/min — ABNORMAL LOW (ref >60.00)
Glucose, Bld: 100 mg/dL — ABNORMAL HIGH (ref 70–99)
POTASSIUM: 4.7 meq/L (ref 3.5–5.1)
Sodium: 139 mEq/L (ref 135–145)

## 2014-05-11 LAB — CBC
HCT: 37.6 % (ref 36.0–46.0)
HEMOGLOBIN: 12.4 g/dL (ref 12.0–15.0)
MCHC: 32.9 g/dL (ref 30.0–36.0)
MCV: 97 fl (ref 78.0–100.0)
PLATELETS: 159 10*3/uL (ref 150.0–400.0)
RBC: 3.87 Mil/uL (ref 3.87–5.11)
RDW: 15.9 % — ABNORMAL HIGH (ref 11.5–15.5)
WBC: 10.8 10*3/uL — ABNORMAL HIGH (ref 4.0–10.5)

## 2014-05-11 MED ORDER — NITROGLYCERIN 0.4 MG SL SUBL: 0.4000 mg | SUBLINGUAL_TABLET | SUBLINGUAL | Status: DC | PRN

## 2014-05-11 NOTE — Patient Instructions (Addendum)
Your physician recommends that you continue on your current medications as directed. Please refer to the Current Medication list given to you today.  Your physician recommends that you have labs today (CBC, BMET)  Your physician wants you to follow-up in: September with Dr. Harrington Challenger. You will receive a reminder letter in the mail two months in advance. If you don't receive a letter, please call our office to schedule the follow-up appointment.

## 2014-05-14 ENCOUNTER — Other Ambulatory Visit: Payer: Self-pay

## 2014-05-14 DIAGNOSIS — I10 Essential (primary) hypertension: Secondary | ICD-10-CM

## 2014-05-14 MED ORDER — HYDROCHLOROTHIAZIDE 25 MG PO TABS: 25.0000 mg | ORAL_TABLET | Freq: Every day | ORAL | Status: DC

## 2014-05-15 ENCOUNTER — Other Ambulatory Visit: Payer: Self-pay | Admitting: Internal Medicine

## 2014-05-28 ENCOUNTER — Other Ambulatory Visit (INDEPENDENT_AMBULATORY_CARE_PROVIDER_SITE_OTHER): Payer: Medicare Other | Admitting: *Deleted

## 2014-05-28 DIAGNOSIS — I10 Essential (primary) hypertension: Secondary | ICD-10-CM | POA: Diagnosis not present

## 2014-05-28 LAB — BASIC METABOLIC PANEL
BUN: 49 mg/dL — ABNORMAL HIGH (ref 6–23)
CO2: 22 mEq/L (ref 19–32)
Calcium: 9.4 mg/dL (ref 8.4–10.5)
Chloride: 110 mEq/L (ref 96–112)
Creatinine, Ser: 1.75 mg/dL — ABNORMAL HIGH (ref 0.40–1.20)
GFR: 28.78 mL/min — ABNORMAL LOW (ref >60.00)
GLUCOSE: 96 mg/dL (ref 70–99)
POTASSIUM: 4.8 meq/L (ref 3.5–5.1)
Sodium: 138 mEq/L (ref 135–145)

## 2014-05-28 LAB — BRAIN NATRIURETIC PEPTIDE: PRO B NATRI PEPTIDE: 124 pg/mL — AB (ref 0.0–100.0)

## 2014-06-03 ENCOUNTER — Telehealth: Payer: Self-pay | Admitting: *Deleted

## 2014-06-03 DIAGNOSIS — I10 Essential (primary) hypertension: Secondary | ICD-10-CM

## 2014-06-03 DIAGNOSIS — N183 Chronic kidney disease, stage 3 unspecified: Secondary | ICD-10-CM

## 2014-06-03 NOTE — Telephone Encounter (Signed)
Reviewed labs.     Kidney function is farily stable. I would recomm trying 1 HCTZ a day instead of 2    Follow BP  Recheck labs in 2 wks (BMET and BNP)    No other changes     Can check in [name]

## 2014-06-08 NOTE — Telephone Encounter (Signed)
   1. Stop HCTZ    2  F/u BMET and BNP in 7 days     Called patient's daughter back. Informed to stop HCTZ Appointment made for labs 06/16/14.

## 2014-06-08 NOTE — Telephone Encounter (Signed)
New Message        Pt's daughter calling to get lab results and also get an answer about meds. States that Caren Hazy was supposed to find out from Dr. Harrington Challenger about medication being stopped. Please call back and advise.

## 2014-06-16 ENCOUNTER — Other Ambulatory Visit (INDEPENDENT_AMBULATORY_CARE_PROVIDER_SITE_OTHER): Payer: Medicare Other

## 2014-06-16 ENCOUNTER — Other Ambulatory Visit: Payer: Self-pay | Admitting: Internal Medicine

## 2014-06-16 DIAGNOSIS — N183 Chronic kidney disease, stage 3 unspecified: Secondary | ICD-10-CM

## 2014-06-16 DIAGNOSIS — I10 Essential (primary) hypertension: Secondary | ICD-10-CM | POA: Diagnosis not present

## 2014-06-16 LAB — BASIC METABOLIC PANEL
BUN: 34 mg/dL — AB (ref 6–23)
CO2: 23 mEq/L (ref 19–32)
CREATININE: 1.56 mg/dL — AB (ref 0.40–1.20)
Calcium: 9.1 mg/dL (ref 8.4–10.5)
Chloride: 114 mEq/L — ABNORMAL HIGH (ref 96–112)
GFR: 32.86 mL/min — AB (ref >60.00)
Glucose, Bld: 107 mg/dL — ABNORMAL HIGH (ref 70–99)
Potassium: 4.4 mEq/L (ref 3.5–5.1)
SODIUM: 141 meq/L (ref 135–145)

## 2014-06-16 LAB — BRAIN NATRIURETIC PEPTIDE: Pro B Natriuretic peptide (BNP): 188 pg/mL — ABNORMAL HIGH (ref 0.0–100.0)

## 2014-06-25 ENCOUNTER — Other Ambulatory Visit: Payer: Self-pay

## 2014-06-25 DIAGNOSIS — R0602 Shortness of breath: Secondary | ICD-10-CM

## 2014-06-25 DIAGNOSIS — N183 Chronic kidney disease, stage 3 unspecified: Secondary | ICD-10-CM

## 2014-06-25 DIAGNOSIS — R079 Chest pain, unspecified: Secondary | ICD-10-CM

## 2014-07-01 ENCOUNTER — Other Ambulatory Visit (INDEPENDENT_AMBULATORY_CARE_PROVIDER_SITE_OTHER): Payer: Medicare Other | Admitting: *Deleted

## 2014-07-01 DIAGNOSIS — N183 Chronic kidney disease, stage 3 unspecified: Secondary | ICD-10-CM

## 2014-07-01 DIAGNOSIS — R079 Chest pain, unspecified: Secondary | ICD-10-CM

## 2014-07-01 DIAGNOSIS — R0602 Shortness of breath: Secondary | ICD-10-CM | POA: Diagnosis not present

## 2014-07-01 LAB — BASIC METABOLIC PANEL
BUN: 36 mg/dL — ABNORMAL HIGH (ref 6–23)
CO2: 25 mEq/L (ref 19–32)
Calcium: 8.9 mg/dL (ref 8.4–10.5)
Chloride: 110 mEq/L (ref 96–112)
Creatinine, Ser: 1.56 mg/dL — ABNORMAL HIGH (ref 0.40–1.20)
GFR: 32.85 mL/min — ABNORMAL LOW (ref >60.00)
GLUCOSE: 97 mg/dL (ref 70–99)
Potassium: 4.9 mEq/L (ref 3.5–5.1)
Sodium: 139 mEq/L (ref 135–145)

## 2014-07-01 LAB — BRAIN NATRIURETIC PEPTIDE: Pro B Natriuretic peptide (BNP): 182 pg/mL — ABNORMAL HIGH (ref 0.0–100.0)

## 2014-07-08 ENCOUNTER — Other Ambulatory Visit: Payer: Self-pay

## 2014-07-08 MED ORDER — SIMVASTATIN 10 MG PO TABS: ORAL_TABLET | ORAL | Status: DC

## 2014-07-10 ENCOUNTER — Telehealth: Payer: Self-pay

## 2014-07-10 ENCOUNTER — Telehealth: Payer: Self-pay | Admitting: Internal Medicine

## 2014-07-10 NOTE — Telephone Encounter (Signed)
Answered per Dr. Harrington Challenger Result note:  Pt made aware of results.   Pt state since stopping her "fluid pill" all together but that the swelling goes away each night.  Pt has no c/o of SOB, made aware if swelling gets worse and/or develops SOB to give [place] a call back.  Pt agreed and no additional questions.

## 2014-07-10 NOTE — Telephone Encounter (Signed)
New message ° ° ° ° °Want lab results °

## 2014-07-10 NOTE — Telephone Encounter (Signed)
Reviewed labs results with pt daughter as she keeps detailed records of pts lab results. No additional questions.

## 2014-07-24 ENCOUNTER — Other Ambulatory Visit: Payer: Self-pay | Admitting: Internal Medicine

## 2014-07-28 ENCOUNTER — Telehealth: Payer: Self-pay | Admitting: Internal Medicine

## 2014-07-28 DIAGNOSIS — I6529 Occlusion and stenosis of unspecified carotid artery: Secondary | ICD-10-CM

## 2014-07-28 NOTE — Telephone Encounter (Signed)
Pt c/o BP issue: STAT if pt c/o blurred vision, one-sided weakness or slurred speech  1. What are your last 5 BP readings? Today 160/72, 148/80, 155/83, 153/76   2. Are you having any other symptoms (ex. Dizziness, headache, blurred vision, passed out)? Confusion on Saturday April 16  3. What is your BP issue? BP has been high per pt daughter calling

## 2014-07-28 NOTE — Telephone Encounter (Signed)
Woke up Saturday morning and just didn't feel right. Thought to herself  "well this is just it..."  Got up and tried to get cereal, but couldn't get the top off the milk. --didn't know how.--was confused and informed daughter of this later Saturday morning when she seemed normal ever since.  No further instances of confusion since then.  Her BPs have been trending up, but still has days when they are in 130s.  Would like to be seen by Dr. Harrington Challenger. Aware that I've sent message to Dr. Harrington Challenger and that she is not back in clinic until 4/25.  Advised to go to ED if another episode occurs. Daughter is in agreement, she is concerned this was a mini stroke.  States its been a little while since carotids last checked.

## 2014-07-28 NOTE — Telephone Encounter (Signed)
Left detailed message to call back on daughter Althea's phone. Informed that I will forward this information to Dr. Harrington Challenger and will call her back tomorrow after hearing from Dr. Harrington Challenger.

## 2014-07-30 NOTE — Telephone Encounter (Signed)
Follow UP  Pt daughter following up on pt's condition. Was told information/update was being sent to Dr. Harrington Challenger and she would reply about possible OV. Pt daughter has not been notified and wants to speak w. Rn. Please call back and discuss.

## 2014-07-30 NOTE — Telephone Encounter (Signed)
Pt's daughter called today again, regarding pt to be put in Dr. Harrington Challenger schedule for next week. Daughter states pt is a litter better but she needs to be seen sooner than July 2016 which  Is  Pt's appointment. Daughter is aware that Dr. Harrington Challenger Has not respond to the message sent to her by [facility]  RN. Pt would like to be call tomorrow.

## 2014-07-31 ENCOUNTER — Ambulatory Visit (HOSPITAL_COMMUNITY): Payer: Medicare Other | Attending: Cardiology | Admitting: *Deleted

## 2014-07-31 DIAGNOSIS — I10 Essential (primary) hypertension: Secondary | ICD-10-CM | POA: Diagnosis not present

## 2014-07-31 DIAGNOSIS — I6523 Occlusion and stenosis of bilateral carotid arteries: Secondary | ICD-10-CM | POA: Insufficient documentation

## 2014-07-31 DIAGNOSIS — E785 Hyperlipidemia, unspecified: Secondary | ICD-10-CM | POA: Insufficient documentation

## 2014-07-31 DIAGNOSIS — I6529 Occlusion and stenosis of unspecified carotid artery: Secondary | ICD-10-CM | POA: Diagnosis not present

## 2014-07-31 DIAGNOSIS — I2510 Atherosclerotic heart disease of native coronary artery without angina pectoris: Secondary | ICD-10-CM | POA: Diagnosis not present

## 2014-07-31 NOTE — Progress Notes (Signed)
Carotid Duplex Scan Performed 

## 2014-07-31 NOTE — Telephone Encounter (Signed)
Spoke with patient's daughter this morning. Per Dr. Harrington Challenger, pt should have carotid doppler and schedule for appointment in clinic on Monday.  Pt had appt for carotids today at 1:30 pm.  Daughter Althea voice appreciation for the appointment for Monday.

## 2014-08-03 ENCOUNTER — Encounter: Payer: Self-pay | Admitting: Internal Medicine

## 2014-08-03 ENCOUNTER — Ambulatory Visit (INDEPENDENT_AMBULATORY_CARE_PROVIDER_SITE_OTHER): Payer: Medicare Other | Admitting: Internal Medicine

## 2014-08-03 VITALS — BP 160/80 | HR 76 | Ht 60.0 in | Wt 126.4 lb

## 2014-08-03 DIAGNOSIS — I6529 Occlusion and stenosis of unspecified carotid artery: Secondary | ICD-10-CM

## 2014-08-03 DIAGNOSIS — I10 Essential (primary) hypertension: Secondary | ICD-10-CM | POA: Diagnosis not present

## 2014-08-03 MED ORDER — HYDROCHLOROTHIAZIDE 25 MG PO TABS: 12.5000 mg | ORAL_TABLET | Freq: Every day | ORAL | Status: DC

## 2014-08-03 NOTE — Progress Notes (Signed)
Cardiology Office Note   Date:  08/03/2014   ID:  [name], [name] 02-22-1919, MRN [id number]  PCP:  Lanette Hampshire, MD  Cardiologist:   Dorris Carnes, MD   No chief complaint on file.     History of Present Illness: [name] is a 81 y.o. female with a history of Patient is a 81 year old with a history of CAD (s/p PTCA/Stent to the LAD and PTCA to ramus in 2003). Sestamibi was negative fir ischemia The patient was last in clinic in Feb   She called last week regarding a spell that she had  She didn't feel like herself  Was confused   Was OK later in day  Has not had any recurrence.    Dnies CP  No SOB  Trace edema     Current Outpatient Prescriptions  Medication Sig Dispense Refill  . allopurinol (ZYLOPRIM) 100 MG tablet TAKE ONE TABLET BY MOUTH ONCE DAILY. 30 tablet 5  . amLODipine-benazepril (LOTREL) 5-20 MG per capsule TAKE (1) CAPSULE BY MOUTH ONCE DAILY. 90 capsule 2  . aspirin 81 MG tablet Take 81 mg by mouth daily.      . metoprolol (TOPROL-XL) 50 MG 24 hr tablet Take 50 mg by mouth daily.      . nitroGLYCERIN (NITROSTAT) 0.4 MG SL tablet Place 1 tablet (0.4 mg total) under the tongue every 5 (five) minutes as needed for chest pain. 25 tablet 3  . raloxifene (EVISTA) 60 MG tablet Take 60 mg by mouth daily.      . simvastatin (ZOCOR) 10 MG tablet TAKE (1) TABLET BY MOUTH AT BEDTIME. 90 tablet 1   No current facility-administered medications for this visit.    Allergies:   Review of patient's allergies indicates no known allergies.   Past Medical History  Diagnosis Date  . HTN (hypertension)   . Gout   . Dyslipidemia   . CAD (coronary artery disease)   . Cystocele   . Renal cell cancer   . Renal insufficiency   . Vaginal wall prolapse   . Hypercholesteremia     Past Surgical History  Procedure Laterality Date  . Nephrectomy      right  . Appendectomy       Social History:  The patient  reports that she has never smoked. She has never used  smokeless tobacco. She reports that she does not drink alcohol or use illicit drugs.   Family History:  The patient's family history includes Cancer in an other family member; Diabetes in her father; Heart attack in her father; Heart disease in an other family member.    ROS:  Please see the history of present illness. All other systems are reviewed and  Negative to the above problem except as noted.    PHYSICAL EXAM: VS:  BP 160/80 mmHg  Pulse 76  Ht 5' (1.524 m)  Wt 126 lb 6.4 oz (57.335 kg)  BMI 24.69 kg/m2  GEN: Well nourished, well developed, in no acute distress HEENT: normal Neck: no JVD, carotid bruits, or masses Cardiac: RRR; no murmurs, rubs, or gallops,no edema  Respiratory:  clear to auscultation bilaterally, normal work of breathing GI: soft, nontender, nondistended, + BS  No hepatomegaly  MS: no deformity Moving all extremities   Skin: warm and dry, no rash Neuro:  Strength and sensation are intact Psych: euthymic mood, full affect   EKG:  EKG is ordered today.  SR 76 bpm     Lipid  Panel    Component Value Date/Time   CHOL 152 11/10/2013 0949   TRIG 216.0* 11/10/2013 0949   HDL 51.10 11/10/2013 0949   CHOLHDL 3 11/10/2013 0949   VLDL 43.2* 11/10/2013 0949   LDLCALC 47 11/04/2012 0205   LDLDIRECT 72.6 11/10/2013 0949      Wt Readings from Last 3 Encounters:  08/03/14 126 lb 6.4 oz (57.335 kg)  05/11/14 121 lb 1.9 oz (54.94 kg)  11/10/13 124 lb (56.246 kg)      ASSESSMENT AND PLAN:  1.  Spell  Not sure what this represent  Carotid USN shwoed moderate plaquing only.  FOllow  O change in meds  2.  HTN  BP is up  I would recomm 1/2 HCTZ ack to regimen  3.  HL  Continue simvistatin       Current medicines are reviewed at length with the patient today.  The patient does not have concerns regarding medicines.  The following changes have been made:   Labs/ tests ordered today include: No orders of the defined types were placed in this encounter.      Disposition:   FU with  in   Signed, Dorris Carnes, MD  08/03/2014 12:38 PM    [address], [place], [place]  [address] Phone: [phone]; Fax: [phone]

## 2014-08-03 NOTE — Patient Instructions (Signed)
Your physician has recommended you make the following change in your medication:1.) HCTZ 12.5 MG (1/2 TABLET)  KEEP YOUR ALREADY SCHEDULED APPOINTMENT WITH DR. Harrington Challenger

## 2014-09-25 ENCOUNTER — Ambulatory Visit (INDEPENDENT_AMBULATORY_CARE_PROVIDER_SITE_OTHER): Payer: Medicare Other | Admitting: Urology

## 2014-09-25 DIAGNOSIS — C659 Malignant neoplasm of unspecified renal pelvis: Secondary | ICD-10-CM | POA: Diagnosis not present

## 2014-09-25 DIAGNOSIS — N8111 Cystocele, midline: Secondary | ICD-10-CM | POA: Diagnosis not present

## 2014-09-25 DIAGNOSIS — N289 Disorder of kidney and ureter, unspecified: Secondary | ICD-10-CM

## 2014-10-24 ENCOUNTER — Inpatient Hospital Stay (HOSPITAL_COMMUNITY)
Admission: EM | Admit: 2014-10-24 | Discharge: 2014-10-25 | DRG: 303 | Disposition: A | Discharge status: Home / Self Care | Payer: Medicare Other | Attending: Internal Medicine | Admitting: Internal Medicine

## 2014-10-24 ENCOUNTER — Encounter (HOSPITAL_COMMUNITY): Payer: Self-pay | Admitting: *Deleted

## 2014-10-24 ENCOUNTER — Emergency Department (HOSPITAL_COMMUNITY): Payer: Medicare Other

## 2014-10-24 DIAGNOSIS — N183 Chronic kidney disease, stage 3 unspecified: Secondary | ICD-10-CM | POA: Diagnosis present

## 2014-10-24 DIAGNOSIS — Z955 Presence of coronary angioplasty implant and graft: Secondary | ICD-10-CM

## 2014-10-24 DIAGNOSIS — I6529 Occlusion and stenosis of unspecified carotid artery: Secondary | ICD-10-CM | POA: Diagnosis present

## 2014-10-24 DIAGNOSIS — M109 Gout, unspecified: Secondary | ICD-10-CM | POA: Diagnosis present

## 2014-10-24 DIAGNOSIS — E860 Dehydration: Secondary | ICD-10-CM | POA: Diagnosis present

## 2014-10-24 DIAGNOSIS — I10 Essential (primary) hypertension: Secondary | ICD-10-CM | POA: Diagnosis present

## 2014-10-24 DIAGNOSIS — R079 Chest pain, unspecified: Secondary | ICD-10-CM | POA: Insufficient documentation

## 2014-10-24 DIAGNOSIS — Z85528 Personal history of other malignant neoplasm of kidney: Secondary | ICD-10-CM

## 2014-10-24 DIAGNOSIS — R072 Precordial pain: Secondary | ICD-10-CM | POA: Diagnosis not present

## 2014-10-24 DIAGNOSIS — I779 Disorder of arteries and arterioles, unspecified: Secondary | ICD-10-CM | POA: Diagnosis present

## 2014-10-24 DIAGNOSIS — E785 Hyperlipidemia, unspecified: Secondary | ICD-10-CM | POA: Diagnosis present

## 2014-10-24 DIAGNOSIS — Z9049 Acquired absence of other specified parts of digestive tract: Secondary | ICD-10-CM | POA: Diagnosis present

## 2014-10-24 DIAGNOSIS — Z7982 Long term (current) use of aspirin: Secondary | ICD-10-CM | POA: Diagnosis not present

## 2014-10-24 DIAGNOSIS — I739 Peripheral vascular disease, unspecified: Secondary | ICD-10-CM

## 2014-10-24 DIAGNOSIS — I129 Hypertensive chronic kidney disease with stage 1 through stage 4 chronic kidney disease, or unspecified chronic kidney disease: Secondary | ICD-10-CM | POA: Diagnosis present

## 2014-10-24 DIAGNOSIS — R42 Dizziness and giddiness: Secondary | ICD-10-CM

## 2014-10-24 DIAGNOSIS — I2510 Atherosclerotic heart disease of native coronary artery without angina pectoris: Principal | ICD-10-CM | POA: Diagnosis present

## 2014-10-24 DIAGNOSIS — Z66 Do not resuscitate: Secondary | ICD-10-CM | POA: Diagnosis present

## 2014-10-24 DIAGNOSIS — Z905 Acquired absence of kidney: Secondary | ICD-10-CM | POA: Diagnosis present

## 2014-10-24 DIAGNOSIS — N184 Chronic kidney disease, stage 4 (severe): Secondary | ICD-10-CM | POA: Diagnosis present

## 2014-10-24 HISTORY — DX: Disorder of arteries and arterioles, unspecified: I77.9

## 2014-10-24 HISTORY — DX: Peripheral vascular disease, unspecified: I73.9

## 2014-10-24 HISTORY — DX: Chronic kidney disease, stage 3 (moderate): N18.3

## 2014-10-24 HISTORY — DX: Chronic kidney disease, stage 3 unspecified: N18.30

## 2014-10-24 HISTORY — DX: Malignant neoplasm of urinary organ, unspecified: C68.9

## 2014-10-24 LAB — PROTIME-INR
INR: 1.01 (ref 0.00–1.49)
PROTHROMBIN TIME: 13.5 s (ref 11.6–15.2)

## 2014-10-24 LAB — BASIC METABOLIC PANEL
Anion gap: 9 (ref 5–15)
BUN: 33 mg/dL — ABNORMAL HIGH (ref 6–20)
CO2: 21 mmol/L — AB (ref 22–32)
Calcium: 9.1 mg/dL (ref 8.9–10.3)
Chloride: 107 mmol/L (ref 101–111)
Creatinine, Ser: 1.53 mg/dL — ABNORMAL HIGH (ref 0.44–1.00)
GFR calc Af Amer: 32 mL/min — ABNORMAL LOW (ref >60)
GFR, EST NON AFRICAN AMERICAN: 28 mL/min — AB (ref >60)
Glucose, Bld: 96 mg/dL (ref 65–99)
Potassium: 5.1 mmol/L (ref 3.5–5.1)
SODIUM: 137 mmol/L (ref 135–145)

## 2014-10-24 LAB — I-STAT TROPONIN, ED: Troponin i, poc: 0 ng/mL (ref 0.00–0.08)

## 2014-10-24 LAB — CBC
HCT: 40.7 % (ref 36.0–46.0)
Hemoglobin: 13.2 g/dL (ref 12.0–15.0)
MCH: 31.6 pg (ref 26.0–34.0)
MCHC: 32.4 g/dL (ref 30.0–36.0)
MCV: 97.4 fL (ref 78.0–100.0)
PLATELETS: 191 10*3/uL (ref 150–400)
RBC: 4.18 MIL/uL (ref 3.87–5.11)
RDW: 14.5 % (ref 11.5–15.5)
WBC: 9.7 10*3/uL (ref 4.0–10.5)

## 2014-10-24 LAB — APTT: APTT: 27 s (ref 24–37)

## 2014-10-24 LAB — TROPONIN I

## 2014-10-24 MED ORDER — NITROGLYCERIN 0.4 MG SL SUBL: 0.4000 mg | SUBLINGUAL_TABLET | SUBLINGUAL | Status: DC | PRN

## 2014-10-24 MED ORDER — MORPHINE SULFATE 2 MG/ML IJ SOLN: 1.0000 mg | INTRAMUSCULAR | Status: DC | PRN

## 2014-10-24 MED ORDER — ACETAMINOPHEN 650 MG RE SUPP: 650.0000 mg | Freq: Four times a day (QID) | RECTAL | Status: DC | PRN

## 2014-10-24 MED ORDER — ALLOPURINOL 100 MG PO TABS
100.0000 mg | ORAL_TABLET | Freq: Every day | ORAL | Status: DC
  Administered 2014-10-24: 100 mg via ORAL
  Filled 2014-10-24 (×2): qty 1

## 2014-10-24 MED ORDER — ONDANSETRON HCL 4 MG PO TABS: 4.0000 mg | ORAL_TABLET | Freq: Four times a day (QID) | ORAL | Status: DC | PRN

## 2014-10-24 MED ORDER — BENAZEPRIL HCL 20 MG PO TABS
20.0000 mg | ORAL_TABLET | Freq: Every day | ORAL | Status: DC
  Filled 2014-10-24: qty 1

## 2014-10-24 MED ORDER — ONDANSETRON HCL 4 MG/2ML IJ SOLN: 4.0000 mg | Freq: Four times a day (QID) | INTRAMUSCULAR | Status: DC | PRN

## 2014-10-24 MED ORDER — ALUM & MAG HYDROXIDE-SIMETH 200-200-20 MG/5ML PO SUSP: 30.0000 mL | Freq: Four times a day (QID) | ORAL | Status: DC | PRN

## 2014-10-24 MED ORDER — AMLODIPINE BESYLATE 5 MG PO TABS
5.0000 mg | ORAL_TABLET | Freq: Every day | ORAL | Status: DC
  Administered 2014-10-25: 5 mg via ORAL
  Filled 2014-10-24: qty 1

## 2014-10-24 MED ORDER — HEPARIN SODIUM (PORCINE) 5000 UNIT/ML IJ SOLN
5000.0000 [IU] | Freq: Three times a day (TID) | INTRAMUSCULAR | Status: DC
Start: 2014-10-24 — End: 2014-10-25
  Administered 2014-10-24 – 2014-10-25 (×2): 5000 [IU] via SUBCUTANEOUS
  Filled 2014-10-24 (×2): qty 1

## 2014-10-24 MED ORDER — ACETAMINOPHEN 325 MG PO TABS: 650.0000 mg | ORAL_TABLET | Freq: Four times a day (QID) | ORAL | Status: DC | PRN

## 2014-10-24 MED ORDER — RALOXIFENE HCL 60 MG PO TABS
60.0000 mg | ORAL_TABLET | Freq: Every day | ORAL | Status: DC
  Administered 2014-10-24: 60 mg via ORAL
  Filled 2014-10-24 (×2): qty 1

## 2014-10-24 MED ORDER — SODIUM CHLORIDE 0.9 % IV SOLN
INTRAVENOUS | Status: DC
  Administered 2014-10-24: 21:00:00 via INTRAVENOUS

## 2014-10-24 MED ORDER — SIMVASTATIN 10 MG PO TABS
10.0000 mg | ORAL_TABLET | Freq: Every day | ORAL | Status: DC
  Administered 2014-10-24: 10 mg via ORAL
  Filled 2014-10-24: qty 1

## 2014-10-24 MED ORDER — AMLODIPINE BESY-BENAZEPRIL HCL 5-20 MG PO CAPS: 1.0000 | ORAL_CAPSULE | Freq: Every day | ORAL | Status: DC

## 2014-10-24 MED ORDER — METOPROLOL SUCCINATE ER 50 MG PO TB24
50.0000 mg | ORAL_TABLET | Freq: Every day | ORAL | Status: DC
  Administered 2014-10-25: 50 mg via ORAL
  Filled 2014-10-24: qty 1

## 2014-10-24 MED ORDER — SODIUM CHLORIDE 0.9 % IJ SOLN
3.0000 mL | Freq: Two times a day (BID) | INTRAMUSCULAR | Status: DC
Start: 2014-10-24 — End: 2014-10-25

## 2014-10-24 MED ORDER — HYDRALAZINE HCL 20 MG/ML IJ SOLN: 5.0000 mg | INTRAMUSCULAR | Status: DC | PRN

## 2014-10-24 MED ORDER — ASPIRIN 81 MG PO CHEW
324.0000 mg | CHEWABLE_TABLET | Freq: Every day | ORAL | Status: DC
Start: 2014-10-24 — End: 2014-10-25
  Administered 2014-10-25: 324 mg via ORAL
  Filled 2014-10-24: qty 4

## 2014-10-24 MED ORDER — ASPIRIN 81 MG PO CHEW
324.0000 mg | CHEWABLE_TABLET | Freq: Once | ORAL | Status: AC
  Administered 2014-10-24: 324 mg via ORAL
  Filled 2014-10-24: qty 4

## 2014-10-24 NOTE — ED Provider Notes (Signed)
CSN: [id number]     Arrival date & time 10/24/14  1611 History   First MD Initiated Contact with Patient 10/24/14 1803     Chief Complaint  Patient presents with  . Chest Pain  . Weakness   (Consider location/radiation/quality/duration/timing/severity/associated sxs/prior Treatment) Patient is a 82 y.o. female presenting with chest pain. No language interpreter was used.  Chest Pain Pain location:  Substernal area Pain quality: pressure   Pain radiates to:  Does not radiate Pain radiates to the back: no   Pain severity:  Mild Onset quality:  Gradual Timing:  Intermittent Progression:  Waxing and waning Chronicity:  New Context: at rest   Relieved by:  Nothing Worsened by:  Nothing tried Ineffective treatments:  None tried Associated symptoms: fatigue, near-syncope and shortness of breath   Associated symptoms: no abdominal pain, no altered mental status, no anxiety, no cough, no diaphoresis, no dysphagia, no fever, no headache, no nausea, no numbness, no palpitations, not vomiting and no weakness   Risk factors: coronary artery disease, high cholesterol and hypertension   Risk factors: no smoking     Past Medical History  Diagnosis Date  . HTN (hypertension)   . Gout   . Dyslipidemia   . CAD (coronary artery disease)   . Cystocele   . Renal cell cancer   . Renal insufficiency   . Vaginal wall prolapse   . Hypercholesteremia    Past Surgical History  Procedure Laterality Date  . Nephrectomy      right  . Appendectomy    . Coronary angioplasty with stent placement     Family History  Problem Relation Age of Onset  . Cancer    . Heart disease    . Heart attack Father   . Diabetes Father    History  Substance Use Topics  . Smoking status: Never Smoker   . Smokeless tobacco: Never Used  . Alcohol Use: No   OB History    No data available     Review of Systems  Constitutional: Positive for fatigue. Negative for fever and diaphoresis.  HENT: Negative for  trouble swallowing.   Respiratory: Positive for shortness of breath. Negative for cough and chest tightness.   Cardiovascular: Positive for chest pain and near-syncope. Negative for palpitations.  Gastrointestinal: Negative for nausea, vomiting, abdominal pain and diarrhea.  Neurological: Positive for light-headedness. Negative for tremors, facial asymmetry, speech difficulty, weakness, numbness and headaches.  Psychiatric/Behavioral: Negative for confusion.  All other systems reviewed and are negative.     Allergies  Review of patient's allergies indicates no known allergies.  Home Medications   Prior to Admission medications   Medication Sig Start Date End Date Taking? Authorizing Provider  allopurinol (ZYLOPRIM) 100 MG tablet TAKE ONE TABLET BY MOUTH ONCE DAILY. 11/13/12  Yes Thomas S Kefalas, PA-C  amLODipine-benazepril (LOTREL) 5-20 MG per capsule TAKE (1) CAPSULE BY MOUTH ONCE DAILY. 07/24/14  Yes [name], MD  aspirin 81 MG tablet Take 81 mg by mouth daily.     Yes Historical Provider, MD  hydrochlorothiazide (HYDRODIURIL) 25 MG tablet Take 0.5 tablets (12.5 mg total) by mouth daily. 08/03/14  Yes [name], MD  metoprolol (TOPROL-XL) 50 MG 24 hr tablet Take 50 mg by mouth daily.     Yes Historical Provider, MD  nitroGLYCERIN (NITROSTAT) 0.4 MG SL tablet Place 1 tablet (0.4 mg total) under the tongue every 5 (five) minutes as needed for chest pain. 05/11/14  Yes [name], MD  raloxifene (EVISTA) 60 MG tablet Take 60 mg by mouth daily.     Yes Historical Provider, MD  simvastatin (ZOCOR) 10 MG tablet TAKE (1) TABLET BY MOUTH AT BEDTIME. 07/08/14  Yes [name], MD   BP 191/81 mmHg  Pulse 73  Temp(Src) 97.8 F (36.6 C) (Oral)  Resp 16  Ht '5\' 1"'$  (1.549 m)  Wt 124 lb (56.246 kg)  BMI 23.44 kg/m2  SpO2 100% Physical Exam  Constitutional: She is oriented to person, place, and time. She appears well-developed and well-nourished. She does not appear ill. No distress.   HENT:  Head: Normocephalic and atraumatic.  Nose: Nose normal.  Mouth/Throat: Oropharynx is clear and moist. No oropharyngeal exudate.  Eyes: EOM are normal. Pupils are equal, round, and reactive to light.  Neck: Normal range of motion. Neck supple.  Cardiovascular: Normal rate, regular rhythm, normal heart sounds and intact distal pulses.   No murmur heard. Pulmonary/Chest: Effort normal and breath sounds normal. No respiratory distress. She has no wheezes. She exhibits no tenderness.  No reproducible chest wall tenderness to palpation   Abdominal: Soft. There is no tenderness. There is no rebound and no guarding.  Musculoskeletal: Normal range of motion. She exhibits no tenderness.  Lymphadenopathy:    She has no cervical adenopathy.  Neurological: She is alert and oriented to person, place, and time. No cranial nerve deficit. Coordination normal.  Full strength and sensation of bilateral upper and lower extremities. Normal coordination  Skin: Skin is warm and dry. She is not diaphoretic.  Psychiatric: She has a normal mood and affect. Her behavior is normal. Judgment and thought content normal.  Nursing note and vitals reviewed.   ED Course  Procedures (including critical care time) Labs Review Labs Reviewed  BASIC METABOLIC PANEL - Abnormal; Notable for the following:    CO2 21 (*)    BUN 33 (*)    Creatinine, Ser 1.53 (*)    GFR calc non Af Amer 28 (*)    GFR calc Af Amer 32 (*)    All other components within normal limits  CBC  I-STAT TROPOININ, ED   Imaging Review Dg Chest 2 View  10/24/2014   CLINICAL DATA:  Chest pain  EXAM: CHEST  2 VIEW  COMPARISON:  11/03/2012  FINDINGS: The heart size and mediastinal contours are within normal limits. Aortic atherosclerosis noted Both lungs are clear. The visualized skeletal structures are unremarkable.  IMPRESSION: 1. Atherosclerotic calcifications. 2. No acute cardiopulmonary abnormalities.   Electronically Signed   By: Kerby Moors M.D.   On: 10/24/2014 17:46     EKG Interpretation   Date/Time:  Saturday October 24 2014 18:18:07 EDT Ventricular Rate:  82 PR Interval:  188 QRS Duration: 78 QT Interval:  374 QTC Calculation: 437 R Axis:   -32 Text Interpretation:  Sinus rhythm Left axis deviation Borderline T  abnormalities, lateral leads Baseline wander in lead(s) V6 No significant  change since last tracing Confirmed by Winfred Leeds  MD, SAM [phone]) on  10/24/2014 6:22:09 PM      MDM   Final diagnoses:  Chest pain, unspecified chest pain type  Lightheadedness   Pt is a 81 yo F with hx of CAD with stent, HTN, HLD, CKD who presents with intermittent chest pain and lightheadedness.  Complains of sensation of chest pressure associated with fatigue and lightheadedness that started this afternoon at rest, then gradually subsided with time.  Also complains of several weeks of rare lightheadedness and generalized weakness  that comes in waves.  Reportedly has had to sit down several times and "rest" for several minutes, then the sensation will resolve.  No focal weakness or numbness, no confusion, no vision changes or speech changes.   Due to hx of CAD and report of chest pressure, will need to rule out ACS.  Given ASA 324.   No pain now, so no additional meds were given.   EKG with with NSR at 82 bpm, no ST elevations/depressions, no specific T wave change.   Trop negative.  CXR with no acute pathology.   Discussed goals of care at length with pt and her family.  They report she would be interested in having a cardiac cath if deemed necessary by cards, so believe a full ACS rule out would be appropriate in this overall highly functioning 81 yo F.    Sees Dr. Harrington Challenger at [facility] cards and Dr. Everette Rank at [facility].   Will call for unassigned admission.   Triaged to Hospitalist, to go to telemetry with Dr. Blaine Hamper.  Appropriate for observation status.   Tori Milks, MD 10/26/14 5284  Orlie Dakin, MD 10/26/14 1324

## 2014-10-24 NOTE — ED Notes (Signed)
Pt reports having generalized weakness for 2-3 days. Having a mild chest discomfort and dizziness intermittently x 3 days. Denies sob. ekg done at triage.

## 2014-10-24 NOTE — H&P (Signed)
[facility] History and Physical  Carly [name] RWE:[id number] DOB: 1919-01-11 DOA: 10/24/2014  Referring physician: ED physician PCP: Lanette Hampshire, MD  Specialists:   Chief Complaint: Chest pain  HPI: Carly [name] is a 81 y.o. female with PMH of hypertension, hyperlipidemia, gout, CAD (post status of stent placement), renal cell carcinoma (s/p of right left nephrostomy), CKD-III, carotid artery stenosis, who presents with chest pain.  She reports that she has been having intermittent chest pain in the past 2 or 3 days. It happens approximately once a day, lasting for a few minutes each time, then resolved spontaneously. The chest pain is located in the substernal area, 3 out of 10 in severity, dull, nonradiating. It is not aggravated or alleviated any known factors. It is associated with lightheadedness. No cough, fever or chills. Currently patient does not have chest pain. Patient does not have abdominal pain, nausea, vomiting, diarrhea, symptoms for UTI, unilateral weakness.  In ED, patient was found to have negative troponin, normal temperature, no tachycardia, WBC 9.7, negative chest x-ray, stable renal function. The patient is admitted to inpatient for further evaluation and treatment. EKG showed left extubation, but no ischemic change.   Where does patient live?   At home  Can patient participate in ADLs?   Little   Review of Systems:   General: no fevers, chills, no changes in body weight, has fatigue HEENT: no blurry vision, hearing changes or sore throat Pulm: no dyspnea, coughing, wheezing CV: has chest pain, no palpitations Abd: no nausea, vomiting, abdominal pain, diarrhea, constipation GU: no dysuria, burning on urination, increased urinary frequency, hematuria  Ext: no leg edema Neuro: no unilateral weakness, numbness, or tingling, no vision change or hearing loss Skin: no rash MSK: No muscle spasm, no deformity, no limitation of range of movement in  spin Heme: No easy bruising.  Travel history: No recent long distant travel.  Allergy: No Known Allergies  Past Medical History  Diagnosis Date  . HTN (hypertension)   . Gout   . Dyslipidemia   . CAD (coronary artery disease)   . Cystocele   . Renal cell cancer   . Renal insufficiency   . Vaginal wall prolapse   . Hypercholesteremia     Past Surgical History  Procedure Laterality Date  . Nephrectomy      right  . Appendectomy    . Coronary angioplasty with stent placement      Social History:  reports that she has never smoked. She has never used smokeless tobacco. She reports that she does not drink alcohol or use illicit drugs.  Family History:  Family History  Problem Relation Age of Onset  . Cancer    . Heart disease    . Heart attack Father   . Diabetes Father      Prior to Admission medications   Medication Sig Start Date End Date Taking? Authorizing Provider  allopurinol (ZYLOPRIM) 100 MG tablet TAKE ONE TABLET BY MOUTH ONCE DAILY. 11/13/12  Yes Thomas S Kefalas, PA-C  amLODipine-benazepril (LOTREL) 5-20 MG per capsule TAKE (1) CAPSULE BY MOUTH ONCE DAILY. 07/24/14  Yes [name], MD  aspirin 81 MG tablet Take 81 mg by mouth daily.     Yes Historical Provider, MD  hydrochlorothiazide (HYDRODIURIL) 25 MG tablet Take 0.5 tablets (12.5 mg total) by mouth daily. 08/03/14  Yes [name], MD  metoprolol (TOPROL-XL) 50 MG 24 hr tablet Take 50 mg by mouth daily.     Yes Historical  Provider, MD  nitroGLYCERIN (NITROSTAT) 0.4 MG SL tablet Place 1 tablet (0.4 mg total) under the tongue every 5 (five) minutes as needed for chest pain. 05/11/14  Yes [name], MD  raloxifene (EVISTA) 60 MG tablet Take 60 mg by mouth daily.     Yes Historical Provider, MD  simvastatin (ZOCOR) 10 MG tablet TAKE (1) TABLET BY MOUTH AT BEDTIME. 07/08/14  Yes [name], MD    Physical Exam: Filed Vitals:   10/24/14 2037 10/24/14 2039 10/24/14 2041 10/24/14 2044  BP: 167/63 174/69 151/67  152/74  Pulse: 74 77    Temp: 98.4 F (36.9 C)     TempSrc: Oral     Resp: 18     Height: '5\' 1"'$  (1.549 m)     Weight: 55.43 kg (122 lb 3.2 oz)     SpO2: 97%      General: Not in acute distress. Dry mucus and membrane HEENT:       Eyes: PERRL, EOMI, no scleral icterus.       ENT: No discharge from the ears and nose, no pharynx injection, no tonsillar enlargement.        Neck: No JVD, no bruit, no mass felt. Heme: No neck lymph node enlargement. Cardiac: S1/S2, RRR, No murmurs, No gallops or rubs. Pulm:  No rales, wheezing, rhonchi or rubs. Abd: Soft, nondistended, nontender, no rebound pain, no organomegaly, BS present. Ext: No pitting leg edema bilaterally. 2+DP/PT pulse bilaterally. Musculoskeletal: No joint deformities, No joint redness or warmth, no limitation of ROM in spin. Skin: No rashes.  Neuro: Alert, oriented X3, cranial nerves II-XII grossly intact, muscle strength 5/5 in all extremities, sensation to light touch intact. Brachial reflex 2+ bilaterally. Knee reflex 1+ bilaterally. Negative Babinski's sign. Normal finger to nose test. Psych: Patient is not psychotic, no suicidal or hemocidal ideation.  Labs on Admission:  Basic Metabolic Panel:  Recent Labs Lab 10/24/14 1648  NA 137  K 5.1  CL 107  CO2 21*  GLUCOSE 96  BUN 33*  CREATININE 1.53*  CALCIUM 9.1   Liver Function Tests: No results for input(s): AST, ALT, ALKPHOS, BILITOT, PROT, ALBUMIN in the last 168 hours. No results for input(s): LIPASE, AMYLASE in the last 168 hours. No results for input(s): AMMONIA in the last 168 hours. CBC:  Recent Labs Lab 10/24/14 1648  WBC 9.7  HGB 13.2  HCT 40.7  MCV 97.4  PLT 191   Cardiac Enzymes:  Recent Labs Lab 10/24/14 2058  TROPONINI <0.03    BNP (last 3 results) No results for input(s): BNP in the last 8760 hours.  ProBNP (last 3 results)  Recent Labs  05/28/14 0935 06/16/14 1501 07/01/14 1012  PROBNP 124.0* 188.0* 182.0*    CBG: No  results for input(s): GLUCAP in the last 168 hours.  Radiological Exams on Admission: Dg Chest 2 View  10/24/2014   CLINICAL DATA:  Chest pain  EXAM: CHEST  2 VIEW  COMPARISON:  11/03/2012  FINDINGS: The heart size and mediastinal contours are within normal limits. Aortic atherosclerosis noted Both lungs are clear. The visualized skeletal structures are unremarkable.  IMPRESSION: 1. Atherosclerotic calcifications. 2. No acute cardiopulmonary abnormalities.   Electronically Signed   By: Kerby Moors M.D.   On: 10/24/2014 17:46    EKG: Independently reviewed.  Abnormal findings: Left axis deviation, no ischemic change Assessment/Plan Principal Problem:   Chest pain Active Problems:   HLD (hyperlipidemia)   HYPERTENSION, BENIGN   CAD, NATIVE VESSEL  Carotid arterial disease   Dizziness   Chronic kidney disease, stage III (moderate)  Chest pain and CAd: Chest x-ray is negative for pneumonia. No signs of DVT, less likely to have PE. Currently patient does not have chest pain. Initial troponin negative. EKG has no ischemic change. Given signigicant risk factors of hypertension, old age, hyperlipidemia and chronic kidney disease, will rule out ACS.   Plan  - will admit to Tele bed  - cycle CE q6 x3 and repeat her EKG in the am  - Nitroglycerin, Morphine, and aspirin, Zocor - Risk factor stratification: will check FLP, THS and A1C  - Consider cardiology consult if test positive for CEs  - 2d echo  HLD: Last LDL was 47 on 11/04/12. -Continue home medications: Zocor -Check FLP  HTN: -Amlodipine, Lotensin, metoprolol,  -when necessary hydralazine -hold HCTZ since pt is dry clinically  Carotid arterial disease: -ASA  Chronic kidney disease, stage III (moderate): Baseline creatinine 1.5-1.6. Her creatinine is 1.53, which is at baseline. -Follow-up renal function. CMP  Gout: stable. -Continue allopurinol  Dizziness: this was associated with chest pain. Now resolved. Patient is  clinically dehydrated. -Check orthostatic signs -Hold HCTZ -Gentle IV fluid: Normal saline at 75 mL    DVT ppx: SQ Heparin     Code Status: DNR Family Communication:  Yes, patient's   daughter    at bed side Disposition Plan: Admit to inpatient   Date of Service 10/24/2014    Ivor Costa [facility] Pager [phone]  If 7PM-7AM, please contact night-coverage [email or web address] Password [facility] 10/24/2014, 10:47 PM

## 2014-10-25 ENCOUNTER — Other Ambulatory Visit (HOSPITAL_COMMUNITY): Payer: Medicare Other

## 2014-10-25 ENCOUNTER — Telehealth: Payer: Self-pay | Admitting: Cardiology

## 2014-10-25 ENCOUNTER — Encounter (HOSPITAL_COMMUNITY): Payer: Self-pay | Admitting: Physician Assistant

## 2014-10-25 DIAGNOSIS — R42 Dizziness and giddiness: Secondary | ICD-10-CM

## 2014-10-25 DIAGNOSIS — I2510 Atherosclerotic heart disease of native coronary artery without angina pectoris: Secondary | ICD-10-CM | POA: Diagnosis not present

## 2014-10-25 DIAGNOSIS — I129 Hypertensive chronic kidney disease with stage 1 through stage 4 chronic kidney disease, or unspecified chronic kidney disease: Secondary | ICD-10-CM | POA: Diagnosis not present

## 2014-10-25 DIAGNOSIS — N184 Chronic kidney disease, stage 4 (severe): Secondary | ICD-10-CM | POA: Diagnosis not present

## 2014-10-25 DIAGNOSIS — E785 Hyperlipidemia, unspecified: Secondary | ICD-10-CM

## 2014-10-25 DIAGNOSIS — I10 Essential (primary) hypertension: Secondary | ICD-10-CM

## 2014-10-25 DIAGNOSIS — I779 Disorder of arteries and arterioles, unspecified: Secondary | ICD-10-CM | POA: Diagnosis not present

## 2014-10-25 DIAGNOSIS — R079 Chest pain, unspecified: Secondary | ICD-10-CM | POA: Diagnosis not present

## 2014-10-25 DIAGNOSIS — M109 Gout, unspecified: Secondary | ICD-10-CM | POA: Diagnosis not present

## 2014-10-25 DIAGNOSIS — I6529 Occlusion and stenosis of unspecified carotid artery: Secondary | ICD-10-CM | POA: Diagnosis not present

## 2014-10-25 DIAGNOSIS — Z66 Do not resuscitate: Secondary | ICD-10-CM | POA: Diagnosis not present

## 2014-10-25 LAB — LIPID PANEL
CHOLESTEROL: 132 mg/dL (ref 0–200)
HDL: 52 mg/dL (ref >40)
LDL Cholesterol: 52 mg/dL (ref 0–99)
Total CHOL/HDL Ratio: 2.5 RATIO
Triglycerides: 138 mg/dL (ref <150)
VLDL: 28 mg/dL (ref 0–40)

## 2014-10-25 LAB — RAPID URINE DRUG SCREEN, HOSP PERFORMED
Amphetamines: NOT DETECTED
BARBITURATES: NOT DETECTED
Benzodiazepines: NOT DETECTED
COCAINE: NOT DETECTED
Opiates: NOT DETECTED
TETRAHYDROCANNABINOL: NOT DETECTED

## 2014-10-25 LAB — COMPREHENSIVE METABOLIC PANEL
ALT: 13 U/L — ABNORMAL LOW (ref 14–54)
ANION GAP: 6 (ref 5–15)
AST: 20 U/L (ref 15–41)
Albumin: 2.7 g/dL — ABNORMAL LOW (ref 3.5–5.0)
Alkaline Phosphatase: 49 U/L (ref 38–126)
BUN: 27 mg/dL — AB (ref 6–20)
CALCIUM: 8.4 mg/dL — AB (ref 8.9–10.3)
CO2: 20 mmol/L — ABNORMAL LOW (ref 22–32)
Chloride: 112 mmol/L — ABNORMAL HIGH (ref 101–111)
Creatinine, Ser: 1.43 mg/dL — ABNORMAL HIGH (ref 0.44–1.00)
GFR calc non Af Amer: 30 mL/min — ABNORMAL LOW (ref >60)
GFR, EST AFRICAN AMERICAN: 35 mL/min — AB (ref >60)
GLUCOSE: 94 mg/dL (ref 65–99)
Potassium: 4.8 mmol/L (ref 3.5–5.1)
Sodium: 138 mmol/L (ref 135–145)
TOTAL PROTEIN: 5.2 g/dL — AB (ref 6.5–8.1)
Total Bilirubin: 0.6 mg/dL (ref 0.3–1.2)

## 2014-10-25 LAB — URINALYSIS, DIPSTICK ONLY
Bilirubin Urine: NEGATIVE
Glucose, UA: NEGATIVE mg/dL
Hgb urine dipstick: NEGATIVE
Ketones, ur: NEGATIVE mg/dL
NITRITE: NEGATIVE
PH: 6 (ref 5.0–8.0)
Protein, ur: NEGATIVE mg/dL
SPECIFIC GRAVITY, URINE: 1.008 (ref 1.005–1.030)
UROBILINOGEN UA: 0.2 mg/dL (ref 0.0–1.0)

## 2014-10-25 LAB — CBC
HCT: 32.9 % — ABNORMAL LOW (ref 36.0–46.0)
Hemoglobin: 10.9 g/dL — ABNORMAL LOW (ref 12.0–15.0)
MCH: 31.4 pg (ref 26.0–34.0)
MCHC: 33.1 g/dL (ref 30.0–36.0)
MCV: 94.8 fL (ref 78.0–100.0)
Platelets: 159 10*3/uL (ref 150–400)
RBC: 3.47 MIL/uL — ABNORMAL LOW (ref 3.87–5.11)
RDW: 14.2 % (ref 11.5–15.5)
WBC: 7.6 10*3/uL (ref 4.0–10.5)

## 2014-10-25 LAB — TROPONIN I: Troponin I: <0.03 ng/mL (ref <0.031)

## 2014-10-25 LAB — TSH: TSH: 3.267 u[IU]/mL (ref 0.350–4.500)

## 2014-10-25 MED ORDER — ASPIRIN 81 MG PO CHEW: 81.0000 mg | CHEWABLE_TABLET | Freq: Every day | ORAL | Status: DC

## 2014-10-25 NOTE — Telephone Encounter (Signed)
Error

## 2014-10-25 NOTE — Progress Notes (Signed)
[facility]                                                                              Patient Demographics  Carly [name], is a 81 y.o. female, DOB - 1918/09/29, UMP:[id number]  Admit date - 10/24/2014   Admitting Physician Ivor Costa, MD  Outpatient Primary MD for the patient is Lanette Hampshire, MD  LOS - 1   Chief Complaint  Patient presents with  . Chest Pain  . Weakness       Brief HPI  Carly [name] is a 81 y.o. female with PMH of hypertension, hyperlipidemia, gout, CAD (post status of stent placement), renal cell carcinoma (s/p of right left nephrostomy), CKD-III, carotid artery stenosis, who presents with chest pain. She reported that she has been having intermittent chest pain in the past 2 or 3 days. It happened approximately once a day, lasting for a few minutes each time, then resolved spontaneously. The chest pain is located in the substernal area, 3 out of 10 in severity, dull, nonradiating, not aggravated or alleviated any known factors, associated with lightheadedness. No cough, fever or chills. Currently patient does not have chest pain. Patient does not have abdominal pain, nausea, vomiting, diarrhea, symptoms for UTI, unilateral weakness. In ED, patient was found to have negative troponin, normal temperature, no tachycardia, WBC 9.7, negative chest x-ray, stable renal function. Patient was admitted for further workup.   Assessment & Plan    Principal Problem: Atypical Chest pain with underlying history of coronary artery disease, PCI, hypertension, hyperlipidemia -  chest pain resolved, chest x-ray negative for pneumonia, No signs of DVT, less likely to have PE, no chest pain or hypoxia. -  Currently patient does not have chest pain. EKG showed no ischemic change. 3 sets of troponins negative so far - Discussed with cardiology, Dr. Percival Spanish, recommended rule out acute ACS, will evaluate patient - Follow 2-D echocardiogram   HLD: Last LDL was 47  on 11/04/12. -Continue Zocor, LDL 52  HTN: -Currently stable, continue Amlodipine, metoprolol,  -hold HCTZ since pt is dry clinically, creatinine improving  Carotid arterial disease: -ASA  Chronic kidney disease, stage III (moderate): Baseline creatinine 1.5-1.6. Her creatinine is 1.53, which is at baseline. -Creatinine improving, holding Lotensin and HCTZ for now  Gout: stable. -Continue allopurinol  Dizziness: this was associated with chest pain.  Continue to hold HCTZ, dizziness has improved, patient was placed on gentle hydration overnight   Code Status: Full code  Family Communication: Discussed in detail with the patient, all imaging results, lab results explained to the patient and her daughter   Disposition Plan: Likely DC home today if 2-D echo does not show any wall motion abnormalities or depressed EF  Time Spent in minutes  25 minutes  Procedures  2-D echo  Consults   Cardiology  DVT Prophylaxis  heparin subcutaneous   Medications  Scheduled Meds: . allopurinol  100 mg Oral Daily  . amLODipine  5 mg Oral Daily  . aspirin  324 mg Oral Daily  . heparin  5,000 Units Subcutaneous 3 times per day  . metoprolol succinate  50 mg Oral Daily  .  raloxifene  60 mg Oral Daily  . simvastatin  10 mg Oral q1800  . sodium chloride  3 mL Intravenous Q12H   Continuous Infusions: . sodium chloride 75 mL/hr at 10/24/14 2054   PRN Meds:.acetaminophen **OR** acetaminophen, alum & mag hydroxide-simeth, hydrALAZINE, morphine injection, nitroGLYCERIN, ondansetron **OR** ondansetron (ZOFRAN) IV   Antibiotics   Anti-infectives    None        Subjective:   Carly [name] was seen and examined today.  Patient denies any chest pain at the time of my examination, no shortness of breath, dizziness or lightheadedness.denies abdominal pain, N/V/D/C, new weakness, numbess, tingling. No acute events overnight.    Objective:   Blood pressure 139/60, pulse 74, temperature  98.6 F (37 C), temperature source Oral, resp. rate 18, height '5\' 1"'$  (1.549 m), weight 55.43 kg (122 lb 3.2 oz), SpO2 95 %.  Wt Readings from Last 3 Encounters:  10/24/14 55.43 kg (122 lb 3.2 oz)  08/03/14 57.335 kg (126 lb 6.4 oz)  05/11/14 54.94 kg (121 lb 1.9 oz)     Intake/Output Summary (Last 24 hours) at 10/25/14 1043 Last data filed at 10/25/14 0200  Gross per 24 hour  Intake  382.5 ml  Output      0 ml  Net  382.5 ml    Exam  General: Alert and oriented x 3, NAD  HEENT:  PERRLA, EOMI, Anicteric Sclera, mucous membranes moist.   Neck: Supple, no JVD, no masses  CVS: S1 S2 auscultated, no rubs, murmurs or gallops. Regular rate and rhythm.  Respiratory: Clear to auscultation bilaterally, no wheezing, rales or rhonchi  Abdomen: Soft, nontender, nondistended, + bowel sounds  Ext: no cyanosis clubbing or edema  Neuro: AAOx3, Cr N's II- XII. Strength 5/5 upper and lower extremities bilaterally  Skin: No rashes  Psych: Normal affect and demeanor, alert and oriented x3    Data Review   Micro Results No results found for this or any previous visit (from the past 240 hour(s)).  Radiology Reports Dg Chest 2 View  10/24/2014   CLINICAL DATA:  Chest pain  EXAM: CHEST  2 VIEW  COMPARISON:  11/03/2012  FINDINGS: The heart size and mediastinal contours are within normal limits. Aortic atherosclerosis noted Both lungs are clear. The visualized skeletal structures are unremarkable.  IMPRESSION: 1. Atherosclerotic calcifications. 2. No acute cardiopulmonary abnormalities.   Electronically Signed   By: Kerby Moors M.D.   On: 10/24/2014 17:46    CBC  Recent Labs Lab 10/24/14 1648 10/25/14 0714  WBC 9.7 7.6  HGB 13.2 10.9*  HCT 40.7 32.9*  PLT 191 159  MCV 97.4 94.8  MCH 31.6 31.4  MCHC 32.4 33.1  RDW 14.5 14.2    Chemistries   Recent Labs Lab 10/24/14 1648 10/25/14 0714  NA 137 138  K 5.1 4.8  CL 107 112*  CO2 21* 20*  GLUCOSE 96 94  BUN 33* 27*    CREATININE 1.53* 1.43*  CALCIUM 9.1 8.4*  AST  --  20  ALT  --  13*  ALKPHOS  --  49  BILITOT  --  0.6   ------------------------------------------------------------------------------------------------------------------ estimated creatinine clearance is 18.2 mL/min (by C-G formula based on Cr of 1.43). ------------------------------------------------------------------------------------------------------------------ No results for input(s): HGBA1C in the last 72 hours. ------------------------------------------------------------------------------------------------------------------  Recent Labs  10/25/14 0246  CHOL 132  HDL 52  LDLCALC 52  TRIG 138  CHOLHDL 2.5   ------------------------------------------------------------------------------------------------------------------  Recent Labs  10/25/14 0246  TSH 3.267   ------------------------------------------------------------------------------------------------------------------  No results for input(s): VITAMINB12, FOLATE, FERRITIN, TIBC, IRON, RETICCTPCT in the last 72 hours.  Coagulation profile  Recent Labs Lab 10/24/14 2058  INR 1.01    No results for input(s): DDIMER in the last 72 hours.  Cardiac Enzymes  Recent Labs Lab 10/24/14 2058 10/25/14 0246  TROPONINI <0.03 <0.03   ------------------------------------------------------------------------------------------------------------------ Invalid input(s): POCBNP  No results for input(s): GLUCAP in the last 72 hours.   RAI,RIPUDEEP M.D. [facility] 10/25/2014, 10:43 AM  Pager: [phone] Between 7am to 7pm - call Pager - [phone]  After 7pm go to [email or web address] - password [email or web address]  Call night coverage person covering after 7pm

## 2014-10-25 NOTE — Progress Notes (Signed)
OT Cancellation Note  Patient Details Name: Carly [name] MRN: [id number] DOB: Jun 01, 1918   Cancelled Treatment:    Reason Eval/Treat Not Completed: OT screened, no needs identified, will sign off  Benito Mccreedy OTR/L [phone] 10/25/2014, 1:11 PM

## 2014-10-25 NOTE — Discharge Summary (Signed)
Physician Discharge Summary   Patient ID: Carly [name] MRN: [id number] DOB/AGE: 1919/02/27 81 y.o.  Admit date: 10/24/2014 Discharge date: 10/25/2014  Primary Care Physician:  Lanette Hampshire, MD  Discharge Diagnoses:   . Atypical Chest pain   Dehydration . HYPERTENSION, BENIGN . HLD (hyperlipidemia) . Chronic kidney disease, stage III (moderate) . Carotid arterial disease . CAD, NATIVE VESSEL  Consults:  Cardiology, Dr Percival Spanish   Recommendations for Outpatient Follow-up:  HCTZ was discontinued as patient is somewhat dehydrated and renal insufficiency  Please follow BMET  TESTS THAT NEED FOLLOW-UP BMET   DIET:  Heart healthy diet    Allergies:  No Known Allergies   Discharge Medications:   Medication List    STOP taking these medications        hydrochlorothiazide 25 MG tablet  Commonly known as:  HYDRODIURIL      TAKE these medications        allopurinol 100 MG tablet  Commonly known as:  ZYLOPRIM  TAKE ONE TABLET BY MOUTH ONCE DAILY.     amLODipine-benazepril 5-20 MG per capsule  Commonly known as:  LOTREL  TAKE (1) CAPSULE BY MOUTH ONCE DAILY.     aspirin 81 MG tablet  Take 81 mg by mouth daily.     metoprolol succinate 50 MG 24 hr tablet  Commonly known as:  TOPROL-XL  Take 50 mg by mouth daily.     nitroGLYCERIN 0.4 MG SL tablet  Commonly known as:  NITROSTAT  Place 1 tablet (0.4 mg total) under the tongue every 5 (five) minutes as needed for chest pain.     raloxifene 60 MG tablet  Commonly known as:  EVISTA  Take 60 mg by mouth daily.     simvastatin 10 MG tablet  Commonly known as:  ZOCOR  TAKE (1) TABLET BY MOUTH AT BEDTIME.         Brief H and P: For complete details please refer to admission H and P, but in brief Carly [name] is a 81 y.o. female with PMH of hypertension, hyperlipidemia, gout, CAD (post status of stent placement), renal cell carcinoma (s/p of right left nephrostomy), CKD-III, carotid artery stenosis, who  presents with chest pain. She reported that she has been having intermittent chest pain in the past 2 or 3 days. It happened approximately once a day, lasting for a few minutes each time, then resolved spontaneously. The chest pain is located in the substernal area, 3 out of 10 in severity, dull, nonradiating, not aggravated or alleviated any known factors, associated with lightheadedness. No cough, fever or chills. Currently patient does not have chest pain. Patient does not have abdominal pain, nausea, vomiting, diarrhea, symptoms for UTI, unilateral weakness. In ED, patient was found to have negative troponin, normal temperature, no tachycardia, WBC 9.7, negative chest x-ray, stable renal function. Patient was admitted for further workup.  Hospital Course:   Atypical Chest pain with underlying history of coronary artery disease, PCI, hypertension, hyperlipidemia - chest pain has completely resolved, chest x-ray negative for pneumonia, No signs of DVT, less likely to have PE, no chest pain or hypoxia. EKG showed no ischemic change. 3 sets of troponins negative. - Cardiology was consulted and did not feel there is evidence of ischemia. No exertional symptoms or anginal symptoms. Cardiology did not recommend any further ischemia work-up and cleared for discharge.    HLD: Last LDL was 47 on 11/04/12. -Continue Zocor, LDL 52  HTN: -Currently stable, continue Amlodipine, metoprolol,  -  HCTZ was discontinued since pt was dry clinically, creatinine improving, 1.4.   Carotid arterial disease: -ASA  Chronic kidney disease, stage III (moderate): Baseline creatinine 1.5-1.6. Her creatinine is 1.53 at admission, which is at baseline. -Creatinine improved to 1.4 after holding HCTZ and gentle hydration.  Gout: stable. -Continue allopurinol  Dizziness: this was associated with chest pain.  DC HCTZ, dizziness has improved, patient was placed on gentle hydration overnight. PT evaluation was done and  recommended no PT follow-up. Patient is back to baseline.  Day of Discharge BP 139/60 mmHg  Pulse 74  Temp(Src) 98.6 F (37 C) (Oral)  Resp 18  Ht '5\' 1"'$  (1.549 m)  Wt 55.43 kg (122 lb 3.2 oz)  BMI 23.10 kg/m2  SpO2 95%  Physical Exam: General: Alert and awake oriented x3 not in any acute distress. HEENT: anicteric sclera, pupils reactive to light and accommodation CVS: S1-S2 clear no murmur rubs or gallops Chest: clear to auscultation bilaterally, no wheezing rales or rhonchi Abdomen: soft nontender, nondistended, normal bowel sounds Extremities: no cyanosis, clubbing or edema noted bilaterally Neuro: Cranial nerves II-XII intact, no focal neurological deficits   The results of significant diagnostics from this hospitalization (including imaging, microbiology, ancillary and laboratory) are listed below for reference.    LAB RESULTS: Basic Metabolic Panel:  Recent Labs Lab 10/24/14 1648 10/25/14 0714  NA 137 138  K 5.1 4.8  CL 107 112*  CO2 21* 20*  GLUCOSE 96 94  BUN 33* 27*  CREATININE 1.53* 1.43*  CALCIUM 9.1 8.4*   Liver Function Tests:  Recent Labs Lab 10/25/14 0714  AST 20  ALT 13*  ALKPHOS 49  BILITOT 0.6  PROT 5.2*  ALBUMIN 2.7*   No results for input(s): LIPASE, AMYLASE in the last 168 hours. No results for input(s): AMMONIA in the last 168 hours. CBC:  Recent Labs Lab 10/24/14 1648 10/25/14 0714  WBC 9.7 7.6  HGB 13.2 10.9*  HCT 40.7 32.9*  MCV 97.4 94.8  PLT 191 159   Cardiac Enzymes:  Recent Labs Lab 10/24/14 2058 10/25/14 0246  TROPONINI <0.03 <0.03   BNP: Invalid input(s): POCBNP CBG: No results for input(s): GLUCAP in the last 168 hours.  Significant Diagnostic Studies:  Dg Chest 2 View  10/24/2014   CLINICAL DATA:  Chest pain  EXAM: CHEST  2 VIEW  COMPARISON:  11/03/2012  FINDINGS: The heart size and mediastinal contours are within normal limits. Aortic atherosclerosis noted Both lungs are clear. The visualized skeletal  structures are unremarkable.  IMPRESSION: 1. Atherosclerotic calcifications. 2. No acute cardiopulmonary abnormalities.   Electronically Signed   By: Kerby Moors M.D.   On: 10/24/2014 17:46       Disposition and Follow-up:     Discharge Instructions    Diet - low sodium heart healthy    Complete by:  As directed      Discharge instructions    Complete by:  As directed   Please STOP hydrochlorothiazide. Continue all other medications.     Increase activity slowly    Complete by:  As directed             DISPOSITION:home    DISCHARGE FOLLOW-UP Follow-up Information    Follow up with Dorris Carnes, MD On 12/11/2014.   Specialty:  Cardiology   Why:  [facility] - 12/11/14 at 8:45am   Contact information:   [address] [place] [address] [phone]       Follow up with [facility]  G, MD. Schedule an appointment as soon as possible for a visit in 2 weeks.   Specialty:  Family Medicine   Why:  for hospital follow-up   Contact information:   [place] [address] [phone]        Time spent on Discharge: 33 mins   Signed:   Moncerrath Berhe M.D. [facility] 10/25/2014, 12:39 PM Pager: [phone]

## 2014-10-25 NOTE — Evaluation (Signed)
Physical Therapy Evaluation Patient Details Name: Carly [name] MRN: [id number] DOB: 11-16-1918 Today's Date: 10/25/2014   History of Present Illness  81 y.o. female admitted for chest pain.  Clinical Impression  Pt is at baseline level of function. She is mod I for all mobility using RW. No PT services indicated. PT signing off.    Follow Up Recommendations No PT follow up    Equipment Recommendations  None recommended by PT    Recommendations for Other Services       Precautions / Restrictions Precautions Precautions: None      Mobility  Bed Mobility Overal bed mobility: Modified Independent                Transfers Overall transfer level: Modified independent Equipment used: Rolling walker (2 wheeled)                Ambulation/Gait Ambulation/Gait assistance: Modified independent (Device/Increase time) Ambulation Distance (Feet): 200 Feet Assistive device: Rolling walker (2 wheeled) Gait Pattern/deviations: Step-through pattern;Decreased stride length     General Gait Details: mildly decreased  Stairs            Wheelchair Mobility    Modified Rankin (Stroke Patients Only)       Balance Overall balance assessment: No apparent balance deficits (not formally assessed)                                           Pertinent Vitals/Pain Pain Assessment: No/denies pain    Home Living Family/patient expects to be discharged to:: Private residence Living Arrangements: Alone Available Help at Discharge: Family;Available PRN/intermittently Type of Home: House Home Access: Stairs to enter Entrance Stairs-Rails: Psychiatric nurse of Steps: 1 Home Layout: One level Home Equipment: Walker - 2 wheels;Grab bars - tub/shower;Cane - single point      Prior Function Level of Independence: Independent with assistive device(s)               Hand Dominance        Extremity/Trunk Assessment                         Communication   Communication: No difficulties  Cognition Arousal/Alertness: Awake/alert Behavior During Therapy: WFL for tasks assessed/performed Overall Cognitive Status: Within Functional Limits for tasks assessed                      General Comments      Exercises        Assessment/Plan    PT Assessment Patent does not need any further PT services  PT Diagnosis Difficulty walking   PT Problem List    PT Treatment Interventions     PT Goals (Current goals can be found in the Care Plan section) Acute Rehab PT Goals Patient Stated Goal: home today PT Goal Formulation: All assessment and education complete, DC therapy    Frequency     Barriers to discharge        Co-evaluation               End of Session Equipment Utilized During Treatment: Gait belt Activity Tolerance: Patient tolerated treatment well Patient left: in chair;with call bell/phone within reach;with family/visitor present Nurse Communication: Mobility status         Time: 3267-1245 PT Time Calculation (min) (ACUTE ONLY): 16 min  Charges:   PT Evaluation $Initial PT Evaluation Tier I: 1 Procedure     PT G Codes:        Lorriane Shire 10/25/2014, 12:28 PM

## 2014-10-25 NOTE — Consult Note (Signed)
Cardiology Consultation Note  Patient ID: Carly [name], MRN: [id number], DOB/AGE: [birth year] 81 y.o. 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 She comes in after having a few chest pains over the last 2-3 days. She says at her age you expect to have a few pains all over your body here and there, so she didn't think too much of it. It would only last a second or two max, then resolve spontaneously. There was no radiation. No SOB, diaphoresis, nausea, vomiting, palpitations, near-syncope or syncope. When asked how she walks around at home, she cheerfully quips, "with my own two feet!" then filling in that she does use a walker or cane. She reports feeling steady on her feet without recent falls. She has not had any exertional chest discomfort. The discomfort is not brought on by anything (exertion, inspiration, movement) and resolves before she has the chance to do anything about it. Her daughter called her yesterday to check on her and she reported feeling more fatigued than usual. She also mentioned the chest discomfort at which time she was brought to the ER. Troponins negative x 3. On arrival BUN/Cr 33/1.53 thus HCTZ was held (along with Lotensin) and she was hydrated. Hgb did go from 13-10.9 but unclear if this was due to hydration given no bleeding reported. She has had variable BPs while in the hospital ranging from 416S-063K systolic. Orthostatic BP showed trend  of 167/63 lying, 174/69 sitting, 151/67 standing, HR not recorded. No events on telemetry. Labs otherwise notable for normal TSH and negative urine drug screen. She is currently pain free.  Her family brings in a BP log with most BPs running between 160-109N systolic but occasionally dipping into the 90s/50s.  Past Medical History  Diagnosis Date  . HTN (hypertension)   . Gout   . Dyslipidemia   . CAD (coronary artery disease)     a. s/p PTCA/stent to LAD and PTCA of ramus in 2003. b. normal nuc 10/2012 with EF 87%  . Cystocele   . Transitional cell carcinoma     a. 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  Marland Kitchen CKD (chronic kidney disease), stage III   . Vaginal wall prolapse   . Carotid arterial disease     a. 07/2014 - duplex 23-55% RICA, 7-32% LICA.      Most Recent Cardiac Studies: Nuc 10/2012 - Overall Impression: Normal stress nuclear study. LV Ejection Fraction: 87%. LV Wall Motion: NL LV Function; NL Wall Motion  2D Echo 2003 SUMMARY - Overall left ventricular systolic function was normal. Left    ventricular ejection fraction was estimated , range being 55    % to 65 %. This study was inadequate for the evaluation of    left ventricular regional wall motion. - Aortic valve thickness was mildly increased.   Surgical History:  Past Surgical History  Procedure Laterality Date  . Nephrectomy      right  . Appendectomy    .  Coronary angioplasty with stent placement       Home Meds: Prior to Admission medications   Medication Sig Start Date End Date Taking? Authorizing Provider  allopurinol (ZYLOPRIM) 100 MG tablet TAKE ONE TABLET BY MOUTH ONCE DAILY. 11/13/12  Yes Thomas S Kefalas, PA-C  amLODipine-benazepril (LOTREL) 5-20 MG per capsule TAKE (1) CAPSULE BY MOUTH ONCE DAILY. 07/24/14  Yes [name], MD  aspirin 81 MG tablet Take 81 mg by mouth daily.     Yes Historical Provider, MD  hydrochlorothiazide  (HYDRODIURIL) 25 MG tablet Take 0.5 tablets (12.5 mg total) by mouth daily. 08/03/14  Yes [name], MD  metoprolol (TOPROL-XL) 50 MG 24 hr tablet Take 50 mg by mouth daily.     Yes Historical Provider, MD  nitroGLYCERIN (NITROSTAT) 0.4 MG SL tablet Place 1 tablet (0.4 mg total) under the tongue every 5 (five) minutes as needed for chest pain. 05/11/14  Yes [name], MD  raloxifene (EVISTA) 60 MG tablet Take 60 mg by mouth daily.     Yes Historical Provider, MD  simvastatin (ZOCOR) 10 MG tablet TAKE (1) TABLET BY MOUTH AT BEDTIME. 07/08/14  Yes [name], MD    Inpatient Medications:  . allopurinol  100 mg Oral Daily  . amLODipine  5 mg Oral Daily  . aspirin  324 mg Oral Daily  . heparin  5,000 Units Subcutaneous 3 times per day  . metoprolol succinate  50 mg Oral Daily  . raloxifene  60 mg Oral Daily  . simvastatin  10 mg Oral q1800  . sodium chloride  3 mL Intravenous Q12H   . sodium chloride 75 mL/hr at 10/24/14 2054    Allergies: No Known Allergies  History   Social History  . Marital Status: Widowed    Spouse Name: N/A  . Number of Children: 2  . Years of Education: N/A   Occupational History  . Not on file.   Social History Main Topics  . Smoking status: Never Smoker   . Smokeless tobacco: Never Used  . Alcohol Use: No  . Drug Use: No  . Sexual Activity: No   Other Topics Concern  . Not on file   Social History Narrative     Family History  Problem Relation Age of Onset  . Cancer    . Heart disease    . Heart attack Father   . Diabetes Father      Review of Systems: General: negative for chills, fever, night sweats or weight changes.  Cardiovascular: negative for chest pain, edema, orthopnea, palpitations, paroxysmal nocturnal dyspnea, shortness of breath or dyspnea on exertion Dermatological: negative for rash Respiratory: negative for cough or wheezing Urologic: negative for hematuria Abdominal: negaUrologic: negative for hematuriative for  nausea, vomiting, bleeding Neurologic: negative for visual changes, syncope, or dizziness All other systems reviewed and are otherwise negative except as noted above.  Labs:  Recent Labs  10/24/14 2058 10/25/14 0246  TROPONINI <0.03 <0.03   Lab Results  Component Value Date   WBC 7.6 10/25/2014   HGB 10.9* 10/25/2014   HCT 32.9* 10/25/2014   MCV 94.8 10/25/2014   PLT 159 10/25/2014    Recent Labs Lab 10/25/14 0714  NA 138  K 4.8  CL 112*  CO2 20*  BUN 27*  CREATININE 1.43*  CALCIUM 8.4*  PROT 5.2*  BILITOT 0.6  ALKPHOS 49  ALT 13*  AST 20  GLUCOSE 94   Lab Results  Component Value Date  CHOL 132 10/25/2014   HDL 52 10/25/2014   LDLCALC 52 10/25/2014   TRIG 138 10/25/2014   Radiology/Studies:  Dg Chest 2 View  10/24/2014   CLINICAL DATA:  Chest pain  EXAM: CHEST  2 VIEW  COMPARISON:  11/03/2012  FINDINGS: The heart size and mediastinal contours are within normal limits. Aortic atherosclerosis noted Both lungs are clear. The visualized skeletal structures are unremarkable.  IMPRESSION: 1. Atherosclerotic calcifications. 2. No acute cardiopulmonary abnormalities.   Electronically Signed   By: Kerby Moors M.D.   On: 10/24/2014 17:46    Wt Readings from Last 3 Encounters:  10/24/14 122 lb 3.2 oz (55.43 kg)  08/03/14 126 lb 6.4 oz (57.335 kg)  05/11/14 121 lb 1.9 oz (54.94 kg)   EKG: NSR 1st degree AVB 73bpm, left axis deviation, TWI avL, no acute changes  Physical Exam: Blood pressure 139/60, pulse 74, temperature 98.6 F (37 C), temperature source Oral, resp. rate 18, height '5\' 1"'$  (1.549 m), weight 122 lb 3.2 oz (55.43 kg), SpO2 95 %. General: Well developed, well nourished WF, in no acute distress. Head: Normocephalic, atraumatic, sclera non-icteric, no xanthomas, nares are without discharge.  Neck: Negative for carotid bruits. JVD not elevated. Lungs: Clear bilaterally to auscultation without wheezes, rales, or rhonchi. Breathing is unlabored. Heart:  RRR with S1 S2. No murmurs, rubs, or gallops appreciated. Abdomen: Soft, non-tender, non-distended with normoactive bowel sounds. No hepatomegaly. No rebound/guarding. No obvious abdominal masses. Msk:  Strength and tone appear normal for age. Extremities: No clubbing or cyanosis. No edema.  Distal pedal pulses are 2+ and equal bilaterally. Neuro: Alert and oriented X 3. No facial asymmetry. No focal deficit. Moves all extremities spontaneously. Psych:  Responds to questions appropriately with a normal affect.    Assessment and Plan:   1. Atypical chest pain 2. Weakness with variable BPs - occasionally soft at home 3. CAD s/p PCI remotely 4. CKD stage III-IV (following nephrectomy for transitional cell carcinoma in 2007) 5. HTN  Ms. Nygren's chest pain is quite atypical and there is no objective evidence of ischemia. She describes it as" the type of pain you'd feel a second of anywhere else in your body at 81 years old, like in your leg, ankle, arm," etc. No exertional symptoms. No associated dyspnea, diaphoresis or nausea. EKG and labwork are reasurring. She does bring in a log of home blood pressures mostly ranging from 841-660Y systolic, but occasionally dipping into the 90s/50s. She reports a good appetite but says she does not drink a lot. Given rise in BUN and mild weakness, agree with stopping HCTZ. Would continue other home regimen. Warning signs reviewed. If fatigue persists could consider transitioning BB off towards another BP medicine. Keep f/u as planned in September 2016 with Dr. Harrington Challenger.  SignedMelina Copa PA-C 10/25/2014, 11:50 AM Pager: [phone]  History and all data above reviewed.  Patient examined.  I agree with the findings as above.  The patient had some very brief chest pain.  No objective evidence of ischemia.  Otherwise feels Ok.  The patient exam reveals COR:RRR  ,  Lungs: Clear  ,  Abd: Positive bowel sounds, no rebound no guarding, Ext No edema  .  All available  labs, radiology testing, previous records reviewed. Agree with documented assessment and plan. Chest pain:  Atypical.  No evidence of ischemia.  No further work up.  HTN:  Her BP at home is running low.  I will stop the HCTZ.  OK to discharge.  Jeneen Rinks Monay Houlton  12:55 PM  10/25/2014

## 2014-10-26 LAB — HEMOGLOBIN A1C
Hgb A1c MFr Bld: 6.2 % — ABNORMAL HIGH (ref 4.8–5.6)
MEAN PLASMA GLUCOSE: 131 mg/dL

## 2014-11-11 DIAGNOSIS — N183 Chronic kidney disease, stage 3 (moderate): Secondary | ICD-10-CM | POA: Diagnosis not present

## 2014-11-11 DIAGNOSIS — R0789 Other chest pain: Secondary | ICD-10-CM | POA: Diagnosis not present

## 2014-12-04 NOTE — Progress Notes (Signed)
Late entry for missed G-code. Based on review of the evaluation and goals by Lorrin Goodell, PT.   2014/11/02 1230  PT G-Codes **NOT FOR INPATIENT CLASS**  Functional Assessment Tool Used clinical judgement based on review of medical record   Functional Limitation Mobility: Walking and moving around  Mobility: Walking and Moving Around Current Status ([id number]) [facility]  Mobility: Walking and Moving Around Goal Status [phone]) [facility]  Mobility: Walking and Moving Around Discharge Status [id number]) [place], [place]  [phone] 12/04/2014

## 2014-12-11 ENCOUNTER — Ambulatory Visit (INDEPENDENT_AMBULATORY_CARE_PROVIDER_SITE_OTHER): Payer: Medicare Other | Admitting: Internal Medicine

## 2014-12-11 VITALS — BP 126/68 | HR 85 | Ht 61.0 in | Wt 124.0 lb

## 2014-12-11 DIAGNOSIS — R079 Chest pain, unspecified: Secondary | ICD-10-CM

## 2014-12-11 DIAGNOSIS — I6529 Occlusion and stenosis of unspecified carotid artery: Secondary | ICD-10-CM

## 2014-12-11 DIAGNOSIS — I10 Essential (primary) hypertension: Secondary | ICD-10-CM | POA: Diagnosis not present

## 2014-12-11 MED ORDER — SIMVASTATIN 10 MG PO TABS: ORAL_TABLET | ORAL | Status: DC

## 2014-12-11 MED ORDER — AMLODIPINE BESY-BENAZEPRIL HCL 5-20 MG PO CAPS: ORAL_CAPSULE | ORAL | Status: DC

## 2014-12-11 MED ORDER — METOPROLOL SUCCINATE ER 50 MG PO TB24: 50.0000 mg | ORAL_TABLET | Freq: Every day | ORAL | Status: DC

## 2014-12-11 NOTE — Progress Notes (Signed)
Cardiology Office Note   Date:  12/11/2014   ID:  Carly [name], DOB 1918-08-09, MRN [id number]  PCP:  Lanette Hampshire, MD  Cardiologist:   Dorris Carnes, MD   No chief complaint on file.  Pt presents for f/u of HTN and CP     History of Present Illness: Carly [name] is a 81 y.o. female with a history of remote CAD (s/p PTCA/stent to LAD and PTCA of ramus in 2003, normal nuc 10/2012 with EF 87%), carotid disease (97-02% RICA, 6-37% LICA in 11/5883), HTN, dyslipidemia, transitional cell carcinoma of the right pelvic ureter s/p right nephroureterectomy 08/2005, CKD stage III-IV, gout Since I saw her in clinic she was admitted to [facility] chest pain.PAin was felt to be noncardiac  Sent home  Note that HCTZ was discontinued  Breathing is OK  Has occasional pains in chest  Nothing like July  Current Outpatient Prescriptions  Medication Sig Dispense Refill  . allopurinol (ZYLOPRIM) 100 MG tablet TAKE ONE TABLET BY MOUTH ONCE DAILY. 30 tablet 5  . amLODipine-benazepril (LOTREL) 5-20 MG per capsule TAKE (1) CAPSULE BY MOUTH ONCE DAILY. 90 capsule 2  . aspirin 81 MG tablet Take 81 mg by mouth daily.      . metoprolol (TOPROL-XL) 50 MG 24 hr tablet Take 50 mg by mouth daily.      . nitroGLYCERIN (NITROSTAT) 0.4 MG SL tablet Place 1 tablet (0.4 mg total) under the tongue every 5 (five) minutes as needed for chest pain. 25 tablet 3  . raloxifene (EVISTA) 60 MG tablet Take 60 mg by mouth daily.      . simvastatin (ZOCOR) 10 MG tablet TAKE (1) TABLET BY MOUTH AT BEDTIME. 90 tablet 1   No current facility-administered medications for this visit.    Allergies:   Review of patient's allergies indicates no known allergies.   Past Medical History  Diagnosis Date  . HTN (hypertension)   . Gout   . Dyslipidemia   . CAD (coronary artery disease)     a. s/p PTCA/stent to LAD and PTCA of ramus in 2003. b. normal nuc 10/2012 with EF 87%  . Cystocele   . Transitional cell carcinoma     a.  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  Marland Kitchen CKD (chronic kidney disease), stage III   . Vaginal wall prolapse   . Carotid arterial disease     a. 07/2014 - duplex 02-77% RICA, 4-12% LICA.    Past Surgical History  Procedure Laterality Date  . Nephrectomy      right  . Appendectomy    . Coronary angioplasty with stent placement       Social History:  The patient  reports that she has never smoked. She has never used smokeless tobacco. She reports that she does not drink alcohol or use illicit drugs.   Family History:  The patient's family history includes Cancer in an other family member; Diabetes in her father; Heart attack in her father; Heart disease in an other family member.    ROS:  Please see the history of present illness. All other systems are reviewed and  Negative to the above problem except as noted.    PHYSICAL EXAM: VS:  BP 126/68 mmHg  Pulse 85  Ht '5\' 1"'$  (1.549 m)  Wt 124 lb (56.246 kg)  BMI 23.44 kg/m2  SpO2 97%  GEN: Well nourished, well developed, in no acute distress HEENT: normal Neck: no  JVD, carotid bruits, or masses Cardiac: RRR; no murmurs, rubs, or gallops,no edema  Respiratory:  clear to auscultation bilaterally, normal work of breathing GI: soft, nontender, nondistended, + BS  No hepatomegaly  MS: no deformity Moving all extremities   Skin: warm and dry, no rash Neuro:  Strength and sensation are intact Psych: euthymic mood, full affect   EKG:  EKG is not ordered today.   Lipid Panel    Component Value Date/Time   CHOL 132 10/25/2014 0246   TRIG 138 10/25/2014 0246   HDL 52 10/25/2014 0246   CHOLHDL 2.5 10/25/2014 0246   VLDL 28 10/25/2014 0246   LDLCALC 52 10/25/2014 0246   LDLDIRECT 72.6 11/10/2013 0949      Wt Readings from Last 3 Encounters:  12/11/14 124 lb (56.246 kg)  10/24/14 122 lb 3.2 oz (55.43 kg)  08/03/14 126 lb 6.4 oz (57.335 kg)      ASSESSMENT AND PLAN: 1  CP   Atypical    2.  CAD  No evid for active ischemia    3.  HTN  BP recordings better  Keep on same regimen  4.  HL  Keep on statin  F/U in Feb    Signed, Kendyl Festa, MD  12/11/2014 9:01 AM    [address], [place],   [address] Phone: [phone]; Fax: [phone]

## 2014-12-11 NOTE — Patient Instructions (Signed)
Your physician recommends that you continue on your current medications as directed. Please refer to the Current Medication list given to you today. Your physician wants you to follow-up in: February, 2017 with Dr. Harrington Challenger.  You will receive a reminder letter in the mail two months in advance. If you don't receive a letter, please call our office to schedule the follow-up appointment.

## 2015-01-11 DIAGNOSIS — Z23 Encounter for immunization: Secondary | ICD-10-CM | POA: Diagnosis not present

## 2015-01-20 DIAGNOSIS — S8001XA Contusion of right knee, initial encounter: Secondary | ICD-10-CM | POA: Diagnosis not present

## 2015-01-20 DIAGNOSIS — S93491A Sprain of other ligament of right ankle, initial encounter: Secondary | ICD-10-CM | POA: Diagnosis not present

## 2015-01-28 DIAGNOSIS — Z23 Encounter for immunization: Secondary | ICD-10-CM | POA: Diagnosis not present

## 2015-02-26 DIAGNOSIS — J209 Acute bronchitis, unspecified: Secondary | ICD-10-CM | POA: Diagnosis not present

## 2015-02-26 DIAGNOSIS — J069 Acute upper respiratory infection, unspecified: Secondary | ICD-10-CM | POA: Diagnosis not present

## 2015-05-20 ENCOUNTER — Encounter: Payer: Self-pay | Admitting: Internal Medicine

## 2015-05-20 ENCOUNTER — Ambulatory Visit (INDEPENDENT_AMBULATORY_CARE_PROVIDER_SITE_OTHER): Payer: Medicare Other | Admitting: Internal Medicine

## 2015-05-20 VITALS — BP 158/86 | HR 78 | Ht 61.0 in | Wt 122.4 lb

## 2015-05-20 DIAGNOSIS — E785 Hyperlipidemia, unspecified: Secondary | ICD-10-CM | POA: Diagnosis not present

## 2015-05-20 DIAGNOSIS — I2510 Atherosclerotic heart disease of native coronary artery without angina pectoris: Secondary | ICD-10-CM | POA: Diagnosis not present

## 2015-05-20 DIAGNOSIS — I10 Essential (primary) hypertension: Secondary | ICD-10-CM

## 2015-05-20 LAB — CBC
HEMATOCRIT: 37.8 % (ref 36.0–46.0)
HEMOGLOBIN: 12.3 g/dL (ref 12.0–15.0)
MCH: 31.9 pg (ref 26.0–34.0)
MCHC: 32.5 g/dL (ref 30.0–36.0)
MCV: 98.2 fL (ref 78.0–100.0)
MPV: 10.9 fL (ref 8.6–12.4)
Platelets: 177 10*3/uL (ref 150–400)
RBC: 3.85 MIL/uL — ABNORMAL LOW (ref 3.87–5.11)
RDW: 15.7 % — AB (ref 11.5–15.5)
WBC: 11.1 10*3/uL — AB (ref 4.0–10.5)

## 2015-05-20 LAB — COMPREHENSIVE METABOLIC PANEL
ALT: 16 U/L (ref 6–29)
AST: 24 U/L (ref 10–35)
Albumin: 3.7 g/dL (ref 3.6–5.1)
Alkaline Phosphatase: 60 U/L (ref 33–130)
BUN: 38 mg/dL — AB (ref 7–25)
CO2: 19 mmol/L — ABNORMAL LOW (ref 20–31)
Calcium: 8.9 mg/dL (ref 8.6–10.4)
Chloride: 108 mmol/L (ref 98–110)
Creat: 1.72 mg/dL — ABNORMAL HIGH (ref 0.60–0.88)
Glucose, Bld: 102 mg/dL — ABNORMAL HIGH (ref 65–99)
POTASSIUM: 4.7 mmol/L (ref 3.5–5.3)
Sodium: 140 mmol/L (ref 135–146)
TOTAL PROTEIN: 6.8 g/dL (ref 6.1–8.1)
Total Bilirubin: 0.3 mg/dL (ref 0.2–1.2)

## 2015-05-20 LAB — LIPID PANEL
Cholesterol: 156 mg/dL (ref 125–200)
HDL: 61 mg/dL (ref >=46)
LDL CALC: 69 mg/dL (ref <130)
TRIGLYCERIDES: 131 mg/dL (ref <150)
Total CHOL/HDL Ratio: 2.6 Ratio (ref <=5.0)
VLDL: 26 mg/dL (ref <30)

## 2015-05-20 LAB — TSH: TSH: 2.33 m[IU]/L

## 2015-05-20 MED ORDER — NITROGLYCERIN 0.4 MG SL SUBL: 0.4000 mg | SUBLINGUAL_TABLET | SUBLINGUAL | Status: DC | PRN

## 2015-05-20 NOTE — Patient Instructions (Signed)
Your physician recommends that you continue on your current medications as directed. Please refer to the Current Medication list given to you today. Your physician recommends that you return for lab work TODAY (CBC, LIPIDS, CMET, TSH)  Your physician wants you to follow-up AROUND THE END OF July, 2017 WITH DR. Harrington Challenger.  You will receive a reminder letter in the mail two months in advance. If you don't receive a letter, please call our office to schedule the follow-up appointment.

## 2015-05-20 NOTE — Progress Notes (Signed)
Cardiology Office Note   Date:  05/20/2015   ID:  Carly [name], DOB December 29, 1918, MRN [id number]  PCP:  Lanette Hampshire, MD  Cardiologist:   Dorris Carnes, MD   No chief complaint on file.  F/U of CAD    History of Present Illness: Carly [name] is a 82 y.o. female with a history ofCAD (s/p PTCA/Stent to the LAD and PTCA to ramus in 2003). Sestamibi was negative fir ischemia  Also a hsitory of HTN and HL   The patient was last in clinic in April 2016  She had a spell of confusion last year  Carotid USN showed mild plaquing    Since seen she has felt prettyg good  Denies CP   BReathing is stable       Current Outpatient Prescriptions  Medication Sig Dispense Refill  . allopurinol (ZYLOPRIM) 100 MG tablet TAKE ONE TABLET BY MOUTH ONCE DAILY. 30 tablet 5  . amLODipine-benazepril (LOTREL) 5-20 MG per capsule TAKE (1) CAPSULE BY MOUTH ONCE DAILY. 90 capsule 3  . aspirin 81 MG tablet Take 81 mg by mouth daily.      . metoprolol succinate (TOPROL-XL) 50 MG 24 hr tablet Take 1 tablet (50 mg total) by mouth daily. 90 tablet 3  . nitroGLYCERIN (NITROSTAT) 0.4 MG SL tablet Place 1 tablet (0.4 mg total) under the tongue every 5 (five) minutes as needed for chest pain. 25 tablet 3  . raloxifene (EVISTA) 60 MG tablet Take 60 mg by mouth daily.      . simvastatin (ZOCOR) 10 MG tablet TAKE (1) TABLET BY MOUTH AT BEDTIME. 90 tablet 3   No current facility-administered medications for this visit.    Allergies:   Review of patient's allergies indicates no known allergies.   Past Medical History  Diagnosis Date  . HTN (hypertension)   . Gout   . Dyslipidemia   . CAD (coronary artery disease)     a. s/p PTCA/stent to LAD and PTCA of ramus in 2003. b. normal nuc 10/2012 with EF 87%  . Cystocele   . Transitional cell carcinoma ([place])     a. 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  Marland Kitchen CKD (chronic kidney disease), stage III   . Vaginal wall  prolapse   . Carotid arterial disease ([place])     a. 07/2014 - duplex 02-63% RICA, 7-85% LICA.    Past Surgical History  Procedure Laterality Date  . Nephrectomy      right  . Appendectomy    . Coronary angioplasty with stent placement       Social History:  The patient  reports that she has never smoked. She has never used smokeless tobacco. She reports that she does not drink alcohol or use illicit drugs.   Family History:  The patient's family history includes Diabetes in her father; Heart attack in her father.    ROS:  Please see the history of present illness. All other systems are reviewed and  Negative to the above problem except as noted.    PHYSICAL EXAM: VS:  BP 158/86 mmHg  Pulse 78  Ht '5\' 1"'$  (1.549 m)  Wt 122 lb 6.4 oz (55.52 kg)  BMI 23.14 kg/m2  SpO2 98%  GEN: Well nourished, well developed, in no acute distress HEENT: normal Neck: no JVD, carotid bruits, or masses Cardiac: RRR; no murmurs, rubs, or gallops,no edema  Respiratory:  clear to auscultation bilaterally, normal work of breathing GI:  soft, nontender, nondistended, + BS  No hepatomegaly  MS: no deformity Moving all extremities   Skin: warm and dry, no rash Neuro:  Strength and sensation are intact Psych: euthymic mood, full affect   EKG:  EKG is not ordered today.   Lipid Panel    Component Value Date/Time   CHOL 132 10/25/2014 0246   TRIG 138 10/25/2014 0246   HDL 52 10/25/2014 0246   CHOLHDL 2.5 10/25/2014 0246   VLDL 28 10/25/2014 0246   LDLCALC 52 10/25/2014 0246   LDLDIRECT 72.6 11/10/2013 0949      Wt Readings from Last 3 Encounters:  05/20/15 122 lb 6.4 oz (55.52 kg)  12/11/14 124 lb (56.246 kg)  10/24/14 122 lb 3.2 oz (55.43 kg)      ASSESSMENT AND PLAN:  1.  CAD  No symptoms to sugg active ischemia  Follow  2.  HTN  BP log from home is pretty good   120s to 140s mostly  Occasional 150/160 Continue current regimen  3.  HL  Keep on simvistation  Will check CBC, CMET  and lipid panel    Disposition:   FU with me  in the fall  Signed, Dorris Carnes, MD  05/20/2015 12:04 PM    [address], [place], [place]  [address] Phone: [phone]; Fax: [phone]

## 2015-06-21 DIAGNOSIS — I10 Essential (primary) hypertension: Secondary | ICD-10-CM | POA: Diagnosis not present

## 2015-06-21 DIAGNOSIS — E782 Mixed hyperlipidemia: Secondary | ICD-10-CM | POA: Diagnosis not present

## 2015-06-21 DIAGNOSIS — M109 Gout, unspecified: Secondary | ICD-10-CM | POA: Diagnosis not present

## 2015-06-21 DIAGNOSIS — M816 Localized osteoporosis [Lequesne]: Secondary | ICD-10-CM | POA: Diagnosis not present

## 2015-07-08 DIAGNOSIS — E782 Mixed hyperlipidemia: Secondary | ICD-10-CM | POA: Diagnosis not present

## 2015-07-12 DIAGNOSIS — I10 Essential (primary) hypertension: Secondary | ICD-10-CM | POA: Diagnosis not present

## 2015-07-12 DIAGNOSIS — R944 Abnormal results of kidney function studies: Secondary | ICD-10-CM | POA: Diagnosis not present

## 2015-07-12 DIAGNOSIS — E782 Mixed hyperlipidemia: Secondary | ICD-10-CM | POA: Diagnosis not present

## 2015-07-12 DIAGNOSIS — M810 Age-related osteoporosis without current pathological fracture: Secondary | ICD-10-CM | POA: Diagnosis not present

## 2015-08-03 ENCOUNTER — Other Ambulatory Visit: Payer: Self-pay | Admitting: Internal Medicine

## 2015-08-03 DIAGNOSIS — I6523 Occlusion and stenosis of bilateral carotid arteries: Secondary | ICD-10-CM

## 2015-08-10 ENCOUNTER — Ambulatory Visit (HOSPITAL_COMMUNITY)
Admission: RE | Admit: 2015-08-10 | Discharge: 2015-08-10 | Disposition: A | Discharge status: Home / Self Care | Payer: Medicare Other | Source: Ambulatory Visit | Attending: Cardiology | Admitting: Cardiology

## 2015-08-10 DIAGNOSIS — I6523 Occlusion and stenosis of bilateral carotid arteries: Secondary | ICD-10-CM | POA: Diagnosis not present

## 2015-08-10 DIAGNOSIS — I2510 Atherosclerotic heart disease of native coronary artery without angina pectoris: Secondary | ICD-10-CM | POA: Diagnosis not present

## 2015-08-10 DIAGNOSIS — I10 Essential (primary) hypertension: Secondary | ICD-10-CM | POA: Diagnosis not present

## 2015-08-10 DIAGNOSIS — E785 Hyperlipidemia, unspecified: Secondary | ICD-10-CM | POA: Insufficient documentation

## 2015-09-13 ENCOUNTER — Other Ambulatory Visit: Payer: Self-pay | Admitting: Internal Medicine

## 2015-10-22 ENCOUNTER — Other Ambulatory Visit: Payer: Self-pay | Admitting: Internal Medicine

## 2015-11-12 ENCOUNTER — Ambulatory Visit (INDEPENDENT_AMBULATORY_CARE_PROVIDER_SITE_OTHER): Payer: Medicare Other | Admitting: Urology

## 2015-11-12 DIAGNOSIS — Z8553 Personal history of malignant neoplasm of renal pelvis: Secondary | ICD-10-CM

## 2015-11-12 DIAGNOSIS — N184 Chronic kidney disease, stage 4 (severe): Secondary | ICD-10-CM | POA: Diagnosis not present

## 2015-11-24 DIAGNOSIS — E782 Mixed hyperlipidemia: Secondary | ICD-10-CM | POA: Diagnosis not present

## 2015-11-26 DIAGNOSIS — M109 Gout, unspecified: Secondary | ICD-10-CM | POA: Diagnosis not present

## 2015-11-26 DIAGNOSIS — R944 Abnormal results of kidney function studies: Secondary | ICD-10-CM | POA: Diagnosis not present

## 2015-11-26 DIAGNOSIS — I10 Essential (primary) hypertension: Secondary | ICD-10-CM | POA: Diagnosis not present

## 2015-11-26 DIAGNOSIS — M816 Localized osteoporosis [Lequesne]: Secondary | ICD-10-CM | POA: Diagnosis not present

## 2015-11-26 DIAGNOSIS — E782 Mixed hyperlipidemia: Secondary | ICD-10-CM | POA: Diagnosis not present

## 2015-12-02 ENCOUNTER — Encounter (INDEPENDENT_AMBULATORY_CARE_PROVIDER_SITE_OTHER): Payer: Self-pay

## 2015-12-02 ENCOUNTER — Encounter: Payer: Self-pay | Admitting: Internal Medicine

## 2015-12-02 ENCOUNTER — Ambulatory Visit (INDEPENDENT_AMBULATORY_CARE_PROVIDER_SITE_OTHER): Payer: Medicare Other | Admitting: Internal Medicine

## 2015-12-02 VITALS — BP 170/80 | HR 69 | Ht 61.0 in | Wt 122.2 lb

## 2015-12-02 DIAGNOSIS — I10 Essential (primary) hypertension: Secondary | ICD-10-CM

## 2015-12-02 DIAGNOSIS — I6523 Occlusion and stenosis of bilateral carotid arteries: Secondary | ICD-10-CM

## 2015-12-02 NOTE — Progress Notes (Signed)
Cardiology Office Note   Date:  12/06/2015   ID:  Carly [name], Carly [name] 1918-10-03, MRN [id number]  PCP:  Wende Neighbors, MD  Cardiologist:   Dorris Carnes, MD   F/U of CAD      History of Present Illness: Carly [name] is a 82 y.o. female with a history ofOmie W [name] is a 82 y.o. female with a history ofCAD (s/p PTCA/Stent to the LAD and PTCA to ramus in 2003). Sestamibi was negative fir ischemia  Also a hsitory of HTN and HL   The patient was last in clinic in April 2016  She had a spell of confusion last year  Carotid USN showed mild plaquing   I saw him in Feb   Since seen she has had one episode of chest pressure  Occurred in bed  Eased off on own Breathing is OK  BP log  At home  BP 100 to 140s  In doctor's office is highter  170s    She denies Other episodes of pain       Outpatient Medications Prior to Visit  Medication Sig Dispense Refill  . allopurinol (ZYLOPRIM) 100 MG tablet TAKE ONE TABLET BY MOUTH ONCE DAILY. 30 tablet 5  . amLODipine-benazepril (LOTREL) 5-20 MG capsule TAKE (1) CAPSULE BY MOUTH ONCE DAILY. 90 capsule 1  . aspirin 81 MG tablet Take 81 mg by mouth daily.      . metoprolol succinate (TOPROL-XL) 50 MG 24 hr tablet Take 1 tablet (50 mg total) by mouth daily. 90 tablet 3  . nitroGLYCERIN (NITROSTAT) 0.4 MG SL tablet Place 1 tablet (0.4 mg total) under the tongue every 5 (five) minutes as needed for chest pain. 25 tablet 3  . raloxifene (EVISTA) 60 MG tablet Take 60 mg by mouth daily.      . simvastatin (ZOCOR) 10 MG tablet TAKE (1) TABLET BY MOUTH AT BEDTIME. 90 tablet 2   No facility-administered medications prior to visit.      Allergies:   Review of patient's allergies indicates no known allergies.   Past Medical History:  Diagnosis Date  . CAD (coronary artery disease)    a. s/p PTCA/stent to LAD and PTCA of ramus in 2003. b. normal nuc 10/2012 with EF 87%  . Carotid arterial disease ([place])    a. 07/2014 - duplex 48-54% RICA, 6-27% LICA.  Marland Kitchen CKD  (chronic kidney disease), stage III   . Cystocele   . Dyslipidemia   . Gout   . HTN (hypertension)   . Transitional cell carcinoma ([place])    a. 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  . Vaginal wall prolapse     Past Surgical History:  Procedure Laterality Date  . APPENDECTOMY    . CORONARY ANGIOPLASTY WITH STENT PLACEMENT    . NEPHRECTOMY     right     Social History:  The patient  reports that she has never smoked. She has never used smokeless tobacco. She reports that she does not drink alcohol or use drugs.   Family History:  The patient's family history includes Diabetes in her father; Heart attack in her father.    ROS:  Please see the history of present illness. All other systems are reviewed and  Negative to the above problem except as noted.    PHYSICAL EXAM: VS:  BP (!) 170/80   Pulse 69   Ht '5\' 1"'$  (1.549 m)   Wt 122 lb 3.2 oz (  55.4 kg)   SpO2 97%   BMI 23.09 kg/m   GEN: Well nourished, well developed, in no acute distress  HEENT: normal  Neck: no JVD, carotid bruits, or masses Cardiac: RRR; no murmurs, rubs, or gallops,no edema  Respiratory:  clear to auscultation bilaterally, normal work of breathing GI: soft, nontender, nondistended, + BS  No hepatomegaly  MS: no deformity Moving all extremities   Skin: warm and dry, no rash Neuro:  Strength and sensation are intact Psych: euthymic mood, full affect   EKG:  EKG is ordered today. SR 69 bpm     Lipid Panel    Component Value Date/Time   CHOL 156 05/20/2015 1223   TRIG 131 05/20/2015 1223   HDL 61 05/20/2015 1223   CHOLHDL 2.6 05/20/2015 1223   VLDL 26 05/20/2015 1223   LDLCALC 69 05/20/2015 1223   LDLDIRECT 72.6 11/10/2013 0949      Wt Readings from Last 3 Encounters:  12/02/15 122 lb 3.2 oz (55.4 kg)  05/20/15 122 lb 6.4 oz (55.5 kg)  12/11/14 124 lb (56.2 kg)      ASSESSMENT AND PLAN:  1  HTN  BP is labile  I spoke to relative who  is a nurse at [employer]   I have asked her to take BP with pt's cuff as well as a cuff from clinic  COmpare  2  CKD  The pt has one kidney  REnal function has been relatively stable for years  She was seen in IM and recomm was that she stop HCTZ ( she is taking 25 mg per day)  I am reluctant to make changes since she ahs been rel stable  I am not convinced of benefit to push and find new meds    3  CAD  One episode of CP at rest  [facility] has NTG to take  We discussed  She does not want any invasive eval.  I told family to call if has more  4  Advanced directives.  I spoke to the pt and family at length about end of life and decisions re care   She is currently a full code  REviewed paper work that she needs to sign re advance directives  They declined receiving papers  They said they will review at home and decide/sign    Signed, Dorris Carnes, MD  12/06/2015 10:37 AM    [address], [place], [place]  [address] Phone: [phone]; Fax: [phone]

## 2015-12-02 NOTE — Patient Instructions (Signed)
Your physician recommends that you continue on your current medications as directed. Please refer to the Current Medication list given to you today. Your physician wants you to follow-up in: January, 2018 with Dr. Harrington Challenger.  You will receive a reminder letter in the mail two months in advance. If you don't receive a letter, please call our office to schedule the follow-up appointment.

## 2015-12-03 ENCOUNTER — Telehealth: Payer: Self-pay | Admitting: Internal Medicine

## 2015-12-03 NOTE — Telephone Encounter (Signed)
New message   Carly [name] verbalized that she is calling about her mother  medication list, she wants to update Dr.Ross because pt has not HTCZ a fluid pill for 6 months or more

## 2015-12-06 NOTE — Telephone Encounter (Signed)
Routing to Dr. Harrington Challenger. Pt was seen in clinic on 12/02/15.  --from OV notes:   [place]:  1  HTN  BP is labile  I spoke to relative who is a nurse at [employer]   I have asked her to take BP with pt's cuff as well as a cuff from clinic  COmpare  2  CKD  The pt has one kidney  REnal function has been relatively stable for years  She was seen in IM and recomm was that she stop HCTZ ( she is taking 25 mg per day)  I am reluctant to make changes since she ahs been rel stable  I am not convinced of benefit to push and find new meds       According to message, pt has not taken HCTZ for > 6 months.  I will remove from her medication list.  Will call patient/family back if any new recommendations.

## 2015-12-07 NOTE — Telephone Encounter (Signed)
When pt in clinic on 8/24 family thought she was taking  (Med list was not correct, had been deleted by primary) Have them review pill bottles They are to get back with her BP measurements  (niece to review)

## 2015-12-10 ENCOUNTER — Encounter: Payer: Self-pay | Admitting: Internal Medicine

## 2015-12-21 ENCOUNTER — Other Ambulatory Visit: Payer: Self-pay | Admitting: Internal Medicine

## 2016-01-04 ENCOUNTER — Other Ambulatory Visit (HOSPITAL_COMMUNITY)
Admission: RE | Admit: 2016-01-04 | Discharge: 2016-01-04 | Disposition: A | Discharge status: Home / Self Care | Payer: Medicare Other | Source: Ambulatory Visit | Attending: Urology | Admitting: Urology

## 2016-01-04 DIAGNOSIS — Z8553 Personal history of malignant neoplasm of renal pelvis: Secondary | ICD-10-CM | POA: Diagnosis not present

## 2016-01-04 DIAGNOSIS — Z8559 Personal history of malignant neoplasm of other urinary tract organ: Secondary | ICD-10-CM | POA: Diagnosis not present

## 2016-01-14 DIAGNOSIS — Z23 Encounter for immunization: Secondary | ICD-10-CM | POA: Diagnosis not present

## 2016-03-23 DIAGNOSIS — E782 Mixed hyperlipidemia: Secondary | ICD-10-CM | POA: Diagnosis not present

## 2016-03-29 DIAGNOSIS — R944 Abnormal results of kidney function studies: Secondary | ICD-10-CM | POA: Diagnosis not present

## 2016-03-29 DIAGNOSIS — I10 Essential (primary) hypertension: Secondary | ICD-10-CM | POA: Diagnosis not present

## 2016-03-29 DIAGNOSIS — E782 Mixed hyperlipidemia: Secondary | ICD-10-CM | POA: Diagnosis not present

## 2016-03-29 DIAGNOSIS — M109 Gout, unspecified: Secondary | ICD-10-CM | POA: Diagnosis not present

## 2016-04-07 DIAGNOSIS — R05 Cough: Secondary | ICD-10-CM | POA: Diagnosis not present

## 2016-04-07 DIAGNOSIS — J060 Acute laryngopharyngitis: Secondary | ICD-10-CM | POA: Diagnosis not present

## 2016-05-01 ENCOUNTER — Encounter: Payer: Self-pay | Admitting: Internal Medicine

## 2016-05-01 ENCOUNTER — Ambulatory Visit (INDEPENDENT_AMBULATORY_CARE_PROVIDER_SITE_OTHER): Payer: Medicare Other | Admitting: Internal Medicine

## 2016-05-01 VITALS — BP 167/70 | HR 74 | Ht 61.0 in | Wt 119.8 lb

## 2016-05-01 DIAGNOSIS — I25709 Atherosclerosis of coronary artery bypass graft(s), unspecified, with unspecified angina pectoris: Secondary | ICD-10-CM | POA: Diagnosis not present

## 2016-05-01 DIAGNOSIS — I209 Angina pectoris, unspecified: Secondary | ICD-10-CM | POA: Diagnosis not present

## 2016-05-01 MED ORDER — METOPROLOL SUCCINATE ER 50 MG PO TB24: 50.0000 mg | ORAL_TABLET | Freq: Every day | ORAL | 3 refills | Status: DC

## 2016-05-01 MED ORDER — AMLODIPINE BESY-BENAZEPRIL HCL 5-20 MG PO CAPS: ORAL_CAPSULE | ORAL | 3 refills | Status: DC

## 2016-05-01 MED ORDER — NITROGLYCERIN 0.4 MG SL SUBL: 0.4000 mg | SUBLINGUAL_TABLET | SUBLINGUAL | 3 refills | Status: DC | PRN

## 2016-05-01 MED ORDER — SIMVASTATIN 10 MG PO TABS: ORAL_TABLET | ORAL | 3 refills | Status: DC

## 2016-05-01 NOTE — Patient Instructions (Signed)
Your physician recommends that you continue on your current medications as directed. Please refer to the Current Medication list given to you today. Your physician wants you to follow-up in: 6 months with Dr. Ross.  You will receive a reminder letter in the mail two months in advance. If you don't receive a letter, please call our office to schedule the follow-up appointment.  

## 2016-05-01 NOTE — Progress Notes (Signed)
Cardiology Office Note   Date:  05/01/2016   ID:  Zayah, Keilman 11-Oct-1918, MRN [id number]  PCP:  Wende Neighbors, MD  Cardiologist:   Dorris Carnes, MD    F/u of CAD     History of Present Illness: Carly [name] is a 83 y.o. female with a history ofCAD (s/p PTCA/Stent to the LAD and PTCA to ramus in 2003). Sestamibi was negative fir ischemia Also a hsitory of HTN and HL  The patient was last in clinic in April 2016 She had a spell of confusion last year Carotid USN showed mild plaquing  I saw him in Feb   Since seen she has had one episode of chest pressure  Occurred in bed  Eased off on own Breathing is OK  BP log  At home  BP 100 to 140s  In doctor's office is highter  170s     Occasional chest pressure in bed  Not at other times   Breatihg is OK  Tires out some   Says she is drinking fluids  Daughters say wt down  DOesnt eat much      Current Meds  Medication Sig  . allopurinol (ZYLOPRIM) 100 MG tablet TAKE ONE TABLET BY MOUTH ONCE DAILY.  Marland Kitchen amLODipine-benazepril (LOTREL) 5-20 MG capsule TAKE (1) CAPSULE BY MOUTH ONCE DAILY.  Marland Kitchen aspirin 81 MG tablet Take 81 mg by mouth daily.    . metoprolol succinate (TOPROL-XL) 50 MG 24 hr tablet Take 1 tablet (50 mg total) by mouth daily.  . nitroGLYCERIN (NITROSTAT) 0.4 MG SL tablet Place 1 tablet (0.4 mg total) under the tongue every 5 (five) minutes as needed for chest pain.  . raloxifene (EVISTA) 60 MG tablet Take 60 mg by mouth daily.    . simvastatin (ZOCOR) 10 MG tablet TAKE (1) TABLET BY MOUTH AT BEDTIME.     Allergies:   Patient has no known allergies.   Past Medical History:  Diagnosis Date  . CAD (coronary artery disease)    a. s/p PTCA/stent to LAD and PTCA of ramus in 2003. b. normal nuc 10/2012 with EF 87%  . Carotid arterial disease ([place])    a. 07/2014 - duplex 71-69% RICA, 6-78% LICA.  Marland Kitchen CKD (chronic kidney disease), stage III   . Cystocele   . Dyslipidemia   . Gout   . HTN (hypertension)   .  Transitional cell carcinoma ([place])    a. 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  . Vaginal wall prolapse     Past Surgical History:  Procedure Laterality Date  . APPENDECTOMY    . CORONARY ANGIOPLASTY WITH STENT PLACEMENT    . NEPHRECTOMY     right     Social History:  The patient  reports that she has never smoked. She has never used smokeless tobacco. She reports that she does not drink alcohol or use drugs.   Family History:  The patient's family history includes Diabetes in her father; Heart attack in her father.    ROS:  Please see the history of present illness. All other systems are reviewed and  Negative to the above problem except as noted.    PHYSICAL EXAM: VS:  BP (!) 167/70   Pulse 74   Ht '5\' 1"'$  (1.549 m)   Wt 119 lb 12.8 oz (54.3 kg)   BMI 22.64 kg/m   GEN: Well nourished, well developed, in no acute distress  HEENT: normal  Neck:  no JVD, carotid bruits, or masses Cardiac: RRR; no murmurs, rubs, or gallops,no edema  Respiratory:  clear to auscultation bilaterally, normal work of breathing GI: soft, nontender, nondistended, + BS  No hepatomegaly  MS: no deformity Moving all extremities   Skin: warm and dry, no rash Neuro:  Strength and sensation are intact Psych: euthymic mood, full affect   EKG:  EKG is not ordered today.   Lipid Panel    Component Value Date/Time   CHOL 156 05/20/2015 1223   TRIG 131 05/20/2015 1223   HDL 61 05/20/2015 1223   CHOLHDL 2.6 05/20/2015 1223   VLDL 26 05/20/2015 1223   LDLCALC 69 05/20/2015 1223   LDLDIRECT 72.6 11/10/2013 0949      Wt Readings from Last 3 Encounters:  05/01/16 119 lb 12.8 oz (54.3 kg)  12/02/15 122 lb 3.2 oz (55.4 kg)  05/20/15 122 lb 6.4 oz (55.5 kg)      ASSESSMENT AND PLAN:  1  CAD  I am not convinced of active angina  I would keep on same regimen  2  HTN  BP is up/down  OVerall not bad  I do not want her to be too low and get  dizzy  3  CRI  Cr level near baseline  Told her to get adeq liquids   I would not change meds though if Cr bumps would consider backing off on ACE I  4  HL  Continue meds     Current medicines are reviewed at length with the patient today.  The patient does not have concerns regarding medicines.  Signed, Dorris Carnes, MD  05/01/2016 11:44 AM    [address], [place], [place]  [address] Phone: [phone]; Fax: [phone]

## 2016-05-03 ENCOUNTER — Encounter: Payer: Self-pay | Admitting: Internal Medicine

## 2016-05-12 DIAGNOSIS — M4316 Spondylolisthesis, lumbar region: Secondary | ICD-10-CM | POA: Diagnosis not present

## 2016-05-12 DIAGNOSIS — M5136 Other intervertebral disc degeneration, lumbar region: Secondary | ICD-10-CM | POA: Diagnosis not present

## 2016-05-12 DIAGNOSIS — S39012D Strain of muscle, fascia and tendon of lower back, subsequent encounter: Secondary | ICD-10-CM | POA: Diagnosis not present

## 2016-07-26 DIAGNOSIS — I10 Essential (primary) hypertension: Secondary | ICD-10-CM | POA: Diagnosis not present

## 2016-08-01 DIAGNOSIS — E782 Mixed hyperlipidemia: Secondary | ICD-10-CM | POA: Diagnosis not present

## 2016-08-01 DIAGNOSIS — R600 Localized edema: Secondary | ICD-10-CM | POA: Diagnosis not present

## 2016-08-01 DIAGNOSIS — R944 Abnormal results of kidney function studies: Secondary | ICD-10-CM | POA: Diagnosis not present

## 2016-08-01 DIAGNOSIS — M109 Gout, unspecified: Secondary | ICD-10-CM | POA: Diagnosis not present

## 2016-08-01 DIAGNOSIS — I10 Essential (primary) hypertension: Secondary | ICD-10-CM | POA: Diagnosis not present

## 2016-08-01 DIAGNOSIS — M816 Localized osteoporosis [Lequesne]: Secondary | ICD-10-CM | POA: Diagnosis not present

## 2016-08-01 DIAGNOSIS — R1310 Dysphagia, unspecified: Secondary | ICD-10-CM | POA: Diagnosis not present

## 2016-09-28 ENCOUNTER — Encounter: Payer: Self-pay | Admitting: Internal Medicine

## 2016-10-04 ENCOUNTER — Other Ambulatory Visit: Payer: Self-pay | Admitting: Internal Medicine

## 2016-10-04 NOTE — Telephone Encounter (Signed)
Medication Detail    Disp Refills Start End   metoprolol succinate (TOPROL-XL) 50 MG 24 hr tablet 90 tablet 3 05/01/2016    Sig - Route: Take 1 tablet (50 mg total) by mouth daily. - Oral   E-Prescribing Status: Receipt confirmed by pharmacy (05/01/2016 12:02 PM EST)   Pharmacy   [facility], [place]

## 2016-10-14 NOTE — Progress Notes (Signed)
Cardiology Office Note   Date:  10/16/2016   ID:  [name], [name] 17-May-1918, MRN [id number]  PCP:  Celene Squibb, MD  Cardiologist:   Dorris Carnes, MD    F/u of CAD     History of Present Illness: Carly [name] is a 82 y.o. female with a history ofCAD (s/p PTCA/Stent to the LAD and PTCA to ramus in 2003). Sestamibi was negative fir ischemia Also a hsitory of HTN and HL  The patient was last in clinic in April 2016 She had a spell of confusion last year Carotid USN showed mild plaquing  I saw him in   Breathing is OK  Says she just is feeling like used to but still lives alone   Tr edema  Likes salt  No PND  No CP  No SOB   Has fear of falling but has not fallen   Current Meds  Medication Sig  . allopurinol (ZYLOPRIM) 100 MG tablet TAKE ONE TABLET BY MOUTH ONCE DAILY.  Marland Kitchen amLODipine-benazepril (LOTREL) 5-20 MG capsule TAKE (1) CAPSULE BY MOUTH ONCE DAILY.  Marland Kitchen aspirin 81 MG tablet Take 81 mg by mouth daily.    . metoprolol succinate (TOPROL-XL) 50 MG 24 hr tablet Take 1 tablet (50 mg total) by mouth daily.  . nitroGLYCERIN (NITROSTAT) 0.4 MG SL tablet Place 1 tablet (0.4 mg total) under the tongue every 5 (five) minutes as needed for chest pain.  . raloxifene (EVISTA) 60 MG tablet Take 60 mg by mouth daily.    . simvastatin (ZOCOR) 10 MG tablet TAKE (1) TABLET BY MOUTH AT BEDTIME.     Allergies:   Patient has no known allergies.   Past Medical History:  Diagnosis Date  . CAD (coronary artery disease)    a. s/p PTCA/stent to LAD and PTCA of ramus in 2003. b. normal nuc 10/2012 with EF 87%  . Carotid arterial disease ([place])    a. 07/2014 - duplex 69-62% RICA, 9-52% LICA.  Marland Kitchen CKD (chronic kidney disease), stage III   . Cystocele   . Dyslipidemia   . Gout   . HTN (hypertension)   . Transitional cell carcinoma ([place])    a. 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  . Vaginal wall prolapse     Past Surgical History:    Procedure Laterality Date  . APPENDECTOMY    . CORONARY ANGIOPLASTY WITH STENT PLACEMENT    . NEPHRECTOMY     right     Social History:  The patient  reports that she has never smoked. She has never used smokeless tobacco. She reports that she does not drink alcohol or use drugs.   Family History:  The patient's family history includes Diabetes in her father; Heart attack in her father.    ROS:  Please see the history of present illness. All other systems are reviewed and  Negative to the above problem except as noted.    PHYSICAL EXAM: VS:  BP (!) 142/82   Pulse 84   Ht 5\' 1"  (1.549 m)   Wt 54 kg (119 lb 1.9 oz)   BMI 22.51 kg/m   GEN: Well nourished, well developed, in no acute distress  HEENT: normal  Neck: no JVD, carotid bruits, or masses Cardiac: RRR; no murmurs, rubs, or gallops, Tr  edema  Respiratory:  clear to auscultation bilaterally, normal work of breathing GI: soft, nontender, nondistended, + BS  No hepatomegaly  MS: no deformity  Moving all extremities   Skin: warm and dry, no rash Neuro:  Strength and sensation are intact Psych: euthymic mood, full affect   EKG:  EKG is not ordered today.  SR 84 bppm     Lipid Panel    Component Value Date/Time   CHOL 156 05/20/2015 1223   TRIG 131 05/20/2015 1223   HDL 61 05/20/2015 1223   CHOLHDL 2.6 05/20/2015 1223   VLDL 26 05/20/2015 1223   LDLCALC 69 05/20/2015 1223   LDLDIRECT 72.6 11/10/2013 0949      Wt Readings from Last 3 Encounters:  10/16/16 54 kg (119 lb 1.9 oz)  05/01/16 54.3 kg (119 lb 12.8 oz)  12/02/15 55.4 kg (122 lb 3.2 oz)      ASSESSMENT AND PLAN:  1  CAD  No symptoms of angina   2  HTN   BP is good on home log  Continue meds    3  CRI    Will get labs from Dr Durene Cal office    4  HL    FlU labs from Dr Nevada Crane  F/U in clinic in 6 months     Current medicines are reviewed at length with the patient today.  The patient does not have concerns regarding medicines.  Signed, Dorris Carnes, MD  10/16/2016 11:13 AM    [address], [place], [place]  [address] Phone: [phone]; Fax: [phone]

## 2016-10-16 ENCOUNTER — Ambulatory Visit (INDEPENDENT_AMBULATORY_CARE_PROVIDER_SITE_OTHER): Payer: Medicare Other | Admitting: Internal Medicine

## 2016-10-16 ENCOUNTER — Encounter: Payer: Self-pay | Admitting: Internal Medicine

## 2016-10-16 VITALS — BP 142/82 | HR 84 | Ht 61.0 in | Wt 119.1 lb

## 2016-10-16 DIAGNOSIS — E782 Mixed hyperlipidemia: Secondary | ICD-10-CM | POA: Diagnosis not present

## 2016-10-16 DIAGNOSIS — I10 Essential (primary) hypertension: Secondary | ICD-10-CM | POA: Diagnosis not present

## 2016-10-16 DIAGNOSIS — I2510 Atherosclerotic heart disease of native coronary artery without angina pectoris: Secondary | ICD-10-CM | POA: Diagnosis not present

## 2016-10-16 NOTE — Patient Instructions (Addendum)
Your physician recommends that you continue on your current medications as directed. Please refer to the Current Medication list given to you today.  Your physician wants you to follow-up in: 6 months with Dr. Harrington Challenger.  You will receive a reminder letter in the mail two months in advance. If you don't receive a letter, please call our office to schedule the follow-up appointment.   Addendum:  Requested lab results from Dr. Juel Burrow office to be faxed to [phone].

## 2017-01-01 ENCOUNTER — Other Ambulatory Visit: Payer: Self-pay | Admitting: Internal Medicine

## 2017-01-29 DIAGNOSIS — I10 Essential (primary) hypertension: Secondary | ICD-10-CM | POA: Diagnosis not present

## 2017-01-31 DIAGNOSIS — I10 Essential (primary) hypertension: Secondary | ICD-10-CM | POA: Diagnosis not present

## 2017-01-31 DIAGNOSIS — M810 Age-related osteoporosis without current pathological fracture: Secondary | ICD-10-CM | POA: Diagnosis not present

## 2017-01-31 DIAGNOSIS — Z23 Encounter for immunization: Secondary | ICD-10-CM | POA: Diagnosis not present

## 2017-01-31 DIAGNOSIS — R600 Localized edema: Secondary | ICD-10-CM | POA: Diagnosis not present

## 2017-01-31 DIAGNOSIS — E782 Mixed hyperlipidemia: Secondary | ICD-10-CM | POA: Diagnosis not present

## 2017-01-31 DIAGNOSIS — R1319 Other dysphagia: Secondary | ICD-10-CM | POA: Diagnosis not present

## 2017-01-31 DIAGNOSIS — R809 Proteinuria, unspecified: Secondary | ICD-10-CM | POA: Diagnosis not present

## 2017-01-31 DIAGNOSIS — R944 Abnormal results of kidney function studies: Secondary | ICD-10-CM | POA: Diagnosis not present

## 2017-01-31 DIAGNOSIS — M109 Gout, unspecified: Secondary | ICD-10-CM | POA: Diagnosis not present

## 2017-04-05 ENCOUNTER — Other Ambulatory Visit: Payer: Self-pay | Admitting: Internal Medicine

## 2017-04-09 ENCOUNTER — Encounter: Payer: Self-pay | Admitting: *Deleted

## 2017-04-23 ENCOUNTER — Encounter (HOSPITAL_COMMUNITY): Payer: Self-pay | Admitting: Emergency Medicine

## 2017-04-23 ENCOUNTER — Emergency Department (HOSPITAL_COMMUNITY)
Admission: EM | Admit: 2017-04-23 | Discharge: 2017-04-23 | Disposition: A | Discharge status: Home / Self Care | Payer: Medicare Other | Attending: Emergency Medicine | Admitting: Emergency Medicine

## 2017-04-23 ENCOUNTER — Emergency Department (HOSPITAL_COMMUNITY): Payer: Medicare Other

## 2017-04-23 ENCOUNTER — Other Ambulatory Visit: Payer: Self-pay

## 2017-04-23 DIAGNOSIS — Y999 Unspecified external cause status: Secondary | ICD-10-CM | POA: Insufficient documentation

## 2017-04-23 DIAGNOSIS — Y9389 Activity, other specified: Secondary | ICD-10-CM | POA: Insufficient documentation

## 2017-04-23 DIAGNOSIS — S5001XA Contusion of right elbow, initial encounter: Secondary | ICD-10-CM | POA: Insufficient documentation

## 2017-04-23 DIAGNOSIS — S8991XA Unspecified injury of right lower leg, initial encounter: Secondary | ICD-10-CM | POA: Diagnosis present

## 2017-04-23 DIAGNOSIS — Z23 Encounter for immunization: Secondary | ICD-10-CM | POA: Diagnosis not present

## 2017-04-23 DIAGNOSIS — Y92010 Kitchen of single-family (private) house as the place of occurrence of the external cause: Secondary | ICD-10-CM | POA: Diagnosis not present

## 2017-04-23 DIAGNOSIS — S8001XA Contusion of right knee, initial encounter: Secondary | ICD-10-CM | POA: Insufficient documentation

## 2017-04-23 DIAGNOSIS — T07XXXA Unspecified multiple injuries, initial encounter: Secondary | ICD-10-CM

## 2017-04-23 DIAGNOSIS — I259 Chronic ischemic heart disease, unspecified: Secondary | ICD-10-CM | POA: Diagnosis not present

## 2017-04-23 DIAGNOSIS — I129 Hypertensive chronic kidney disease with stage 1 through stage 4 chronic kidney disease, or unspecified chronic kidney disease: Secondary | ICD-10-CM | POA: Insufficient documentation

## 2017-04-23 DIAGNOSIS — Z79899 Other long term (current) drug therapy: Secondary | ICD-10-CM | POA: Insufficient documentation

## 2017-04-23 DIAGNOSIS — Z955 Presence of coronary angioplasty implant and graft: Secondary | ICD-10-CM | POA: Insufficient documentation

## 2017-04-23 DIAGNOSIS — C3400 Malignant neoplasm of unspecified main bronchus: Secondary | ICD-10-CM | POA: Diagnosis not present

## 2017-04-23 DIAGNOSIS — W1830XA Fall on same level, unspecified, initial encounter: Secondary | ICD-10-CM | POA: Insufficient documentation

## 2017-04-23 DIAGNOSIS — Z7982 Long term (current) use of aspirin: Secondary | ICD-10-CM | POA: Diagnosis not present

## 2017-04-23 DIAGNOSIS — S5011XA Contusion of right forearm, initial encounter: Secondary | ICD-10-CM | POA: Diagnosis not present

## 2017-04-23 DIAGNOSIS — N183 Chronic kidney disease, stage 3 (moderate): Secondary | ICD-10-CM | POA: Insufficient documentation

## 2017-04-23 MED ORDER — TETANUS-DIPHTH-ACELL PERTUSSIS 5-2.5-18.5 LF-MCG/0.5 IM SUSP
0.5000 mL | Freq: Once | INTRAMUSCULAR | Status: AC
  Administered 2017-04-23: 0.5 mL via INTRAMUSCULAR

## 2017-04-23 MED ORDER — TETANUS-DIPHTH-ACELL PERTUSSIS 5-2.5-18.5 LF-MCG/0.5 IM SUSP
INTRAMUSCULAR | Status: AC
  Filled 2017-04-23: qty 0.5

## 2017-04-23 NOTE — ED Triage Notes (Signed)
Pt tripped and fell throwing water out. Abrasion/swelling and brusiing noted to right knee. Pt has skin tears noted to right posterior fa and left lateral fa skin tear. Areas being cleaned and covered by R Minter rn. Pt a/o. Nad. Lives alone.

## 2017-04-23 NOTE — ED Provider Notes (Signed)
[facility] Provider Note   CSN: [id number] Arrival date & time: 04/23/17  1529     History   Chief Complaint Chief Complaint  Patient presents with  . Fall    HPI Carly [name] is a 84 y.o. female.  [name] is 68.  She lives independently.  She has family that live close by that help look after her.  Today she was cooking.  She went to pour some water outside.  When she pushed on her screen door it went forward and so did she.  She fell onto her hands and knees onto her brick porch.  She has a "call button" she pressed it.  Her family came.  She was able to get up.  She has some bruising on her knee, and some skin tears on her right elbow.  Declines any pain medication.  States she feels well.  HPI  Past Medical History:  Diagnosis Date  . CAD (coronary artery disease)    a. s/p PTCA/stent to LAD and PTCA of ramus in 2003. b. normal nuc 10/2012 with EF 87%  . Carotid arterial disease ([place])    a. 07/2014 - duplex 25-05% RICA, 3-97% LICA.  Marland Kitchen CKD (chronic kidney disease), stage III ([place])   . Cystocele   . Dyslipidemia   . Gout   . HTN (hypertension)   . Transitional cell carcinoma ([place])    a. 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  . Vaginal wall prolapse     Patient Active Problem List   Diagnosis Date Noted  . Pain in the chest   . Chest pain 11/03/2012  . Chronic kidney disease, stage III (moderate) ([place]) 11/03/2012  . Dizziness 04/28/2011  . Carotid arterial disease ([place]) 06/08/2009  . HLD (hyperlipidemia) 10/02/2008  . HYPERTENSION, BENIGN 10/02/2008  . CAD, NATIVE VESSEL 10/02/2008  . CLOSED FRACTURE OF LATERAL MALLEOLUS 01/06/2008    Past Surgical History:  Procedure Laterality Date  . APPENDECTOMY    . CORONARY ANGIOPLASTY WITH STENT PLACEMENT    . NEPHRECTOMY     right    OB History    No data available       Home Medications    Prior to Admission medications     Medication Sig Start Date End Date Taking? Authorizing Provider  allopurinol (ZYLOPRIM) 100 MG tablet TAKE ONE TABLET BY MOUTH ONCE DAILY. 11/13/12   Baird Cancer, PA-C  amLODipine-benazepril (LOTREL) 5-20 MG capsule TAKE (1) CAPSULE BY MOUTH ONCE DAILY. 05/01/16   [name], MD  aspirin 81 MG tablet Take 81 mg by mouth daily.      [provider]  metoprolol succinate (TOPROL-XL) 50 MG 24 hr tablet TAKE 1 TABLET BY MOUTH ONCE DAILY. 04/05/17   [name], MD  nitroGLYCERIN (NITROSTAT) 0.4 MG SL tablet Place 1 tablet (0.4 mg total) under the tongue every 5 (five) minutes as needed for chest pain. 05/01/16   [name], MD  raloxifene (EVISTA) 60 MG tablet Take 60 mg by mouth daily.      [provider]  simvastatin (ZOCOR) 10 MG tablet TAKE (1) TABLET BY MOUTH AT BEDTIME. 05/01/16   [name], MD    Family History Family History  Problem Relation Age of Onset  . Heart attack Father   . Diabetes Father   . Cancer Unknown   . Heart disease Unknown     Social History Social History  Tobacco Use  . Smoking status: Never Smoker  . Smokeless tobacco: Never Used  Substance Use Topics  . Alcohol use: No  . Drug use: No     Allergies   Patient has no known allergies.   Review of Systems Review of Systems  Constitutional: Negative for appetite change, chills, diaphoresis, fatigue and fever.  HENT: Negative for mouth sores, sore throat and trouble swallowing.   Eyes: Negative for visual disturbance.  Respiratory: Negative for cough, chest tightness, shortness of breath and wheezing.   Cardiovascular: Negative for chest pain.  Gastrointestinal: Negative for abdominal distention, abdominal pain, diarrhea, nausea and vomiting.  Endocrine: Negative for polydipsia, polyphagia and polyuria.  Genitourinary: Negative for dysuria, frequency and hematuria.  Musculoskeletal: Negative for gait problem.       Skin tears to right forearm elbow.  Bruising  tenderness just below the right patella  Skin: Negative for color change, pallor and rash.  Neurological: Negative for dizziness, syncope, light-headedness and headaches.  Hematological: Does not bruise/bleed easily.  Psychiatric/Behavioral: Negative for behavioral problems and confusion.     Physical Exam Updated Vital Signs BP (!) 185/90 (BP Location: Right Arm)   Pulse 99   Temp (!) 97.5 F (36.4 C) (Oral)   Resp 19   SpO2 99%   Physical Exam  Constitutional: She is oriented to person, place, and time. She appears well-developed and well-nourished. No distress.  HENT:  Head: Normocephalic.  Eyes: Conjunctivae are normal. Pupils are equal, round, and reactive to light. No scleral icterus.  Neck: Normal range of motion. Neck supple. No thyromegaly present.  Cardiovascular: Normal rate and regular rhythm. Exam reveals no gallop and no friction rub.  No murmur heard. Pulmonary/Chest: Effort normal and breath sounds normal. No respiratory distress. She has no wheezes. She has no rales.  Abdominal: Soft. Bowel sounds are normal. She exhibits no distension. There is no tenderness. There is no rebound.  Musculoskeletal: Normal range of motion.  Full range of motion flex and extend the elbow.  Full range of motion to pronate supinate.  Normal neurovascular exam.  Superficial skin tears and bruising.  Some ecchymosis and soft tissue swelling inferior to the patella.  Extensor mechanism is intact.  No palpable effusion.  Neurological: She is alert and oriented to person, place, and time.  Skin: Skin is warm and dry. No rash noted.  Psychiatric: She has a normal mood and affect. Her behavior is normal.     ED Treatments / Results  Labs (all labs ordered are listed, but only abnormal results are displayed) Labs Reviewed - No data to display  EKG  EKG Interpretation None       Radiology Dg Knee Complete 4 Views Right  Result Date: 04/23/2017 CLINICAL DATA:  Fall with anterior  knee pain and bruising EXAM: RIGHT KNEE - COMPLETE 4+ VIEW COMPARISON:  None. FINDINGS: No fracture or malalignment is seen. Small joint effusion. Infrapatellar soft tissue swelling. Vascular calcifications. IMPRESSION: No acute osseous abnormality.  Small joint effusion Electronically Signed   By: Donavan Foil M.D.   On: 04/23/2017 17:05    Procedures Procedures (including critical care time)  Medications Ordered in ED Medications - No data to display   Initial Impression / Assessment and Plan / ED Course  I have reviewed the triage vital signs and the nursing notes.  Pertinent labs & imaging results that were available during my care of the patient were reviewed by me and considered in my medical decision making (see chart  for details).     Right elbow, and right knee show no plan knee sleeve.  Wound care.  Home.  Ice, Tylenol, expectant management.  Family will assist her with ADLs.  Final Clinical Impressions(s) / ED Diagnoses   Final diagnoses:  Multiple contusions    ED Discharge Orders    None       Tanna Furry, MD 04/23/17 1805

## 2017-04-23 NOTE — Discharge Instructions (Signed)
Ice to painful areas as needed. Tylenol for pain

## 2017-05-07 ENCOUNTER — Ambulatory Visit (INDEPENDENT_AMBULATORY_CARE_PROVIDER_SITE_OTHER): Payer: Medicare Other | Admitting: Internal Medicine

## 2017-05-07 ENCOUNTER — Encounter: Payer: Self-pay | Admitting: Internal Medicine

## 2017-05-07 VITALS — BP 144/72 | HR 78 | Ht 61.0 in | Wt 118.8 lb

## 2017-05-07 DIAGNOSIS — I25709 Atherosclerosis of coronary artery bypass graft(s), unspecified, with unspecified angina pectoris: Secondary | ICD-10-CM | POA: Diagnosis not present

## 2017-05-07 DIAGNOSIS — I209 Angina pectoris, unspecified: Secondary | ICD-10-CM | POA: Diagnosis not present

## 2017-05-07 DIAGNOSIS — I10 Essential (primary) hypertension: Secondary | ICD-10-CM | POA: Diagnosis not present

## 2017-05-07 DIAGNOSIS — E782 Mixed hyperlipidemia: Secondary | ICD-10-CM | POA: Diagnosis not present

## 2017-05-07 NOTE — Progress Notes (Signed)
Cardiology Office Note   Date:  05/07/2017   ID:  Kamee, Bobst 01-29-1919, MRN [id number]  PCP:  Celene Squibb, MD  Cardiologist:   Dorris Carnes, MD    F/u of CAD     History of Present Illness: RUT BETTERTON is a 84 y.o. female with a history ofCAD (s/p PTCA/Stent to the LAD and PTCA to ramus in 2003). Sestamibi was negative fir ischemia Also a hsitory of HTN and HL  The patient was last in clinic about 6 months ago   Since seen she has rare CP  Does not do anything for the pain  Had one night that daughter says she felt bad.  By am, discomfort was gone  Breathing is [place] Face once   No dizzness Current Meds  Medication Sig  . allopurinol (ZYLOPRIM) 100 MG tablet TAKE ONE TABLET BY MOUTH ONCE DAILY.  Marland Kitchen amLODipine-benazepril (LOTREL) 5-20 MG capsule TAKE (1) CAPSULE BY MOUTH ONCE DAILY.  Marland Kitchen aspirin 81 MG tablet Take 81 mg by mouth daily.    . metoprolol succinate (TOPROL-XL) 50 MG 24 hr tablet TAKE 1 TABLET BY MOUTH ONCE DAILY.  . nitroGLYCERIN (NITROSTAT) 0.4 MG SL tablet Place 1 tablet (0.4 mg total) under the tongue every 5 (five) minutes as needed for chest pain.  . raloxifene (EVISTA) 60 MG tablet Take 60 mg by mouth daily.    . simvastatin (ZOCOR) 10 MG tablet TAKE (1) TABLET BY MOUTH AT BEDTIME.     Allergies:   Patient has no known allergies.   Past Medical History:  Diagnosis Date  . CAD (coronary artery disease)    a. s/p PTCA/stent to LAD and PTCA of ramus in 2003. b. normal nuc 10/2012 with EF 87%  . Carotid arterial disease ([place])    a. 07/2014 - duplex 40-81% RICA, 4-48% LICA.  Marland Kitchen CKD (chronic kidney disease), stage III ([place])   . Cystocele   . Dyslipidemia   . Gout   . HTN (hypertension)   . Transitional cell carcinoma ([place])    a. 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  . Vaginal wall prolapse     Past Surgical History:  Procedure Laterality Date  . APPENDECTOMY    . CORONARY ANGIOPLASTY WITH  STENT PLACEMENT    . NEPHRECTOMY     right     Social History:  The patient  reports that  has never smoked. she has never used smokeless tobacco. She reports that she does not drink alcohol or use drugs.   Family History:  The patient's family history includes Cancer in her unknown relative; Diabetes in her father; Heart attack in her father; Heart disease in her unknown relative.    ROS:  Please see the history of present illness. All other systems are reviewed and  Negative to the above problem except as noted.    PHYSICAL EXAM: VS:  BP (!) 144/72   Pulse 78   Ht 5\' 1"  (1.549 m)   Wt 118 lb 12.8 oz (53.9 kg)   BMI 22.45 kg/m   GEN: Well nourished, well developed, in no acute distress  HEENT: normal  Neck: JVP is normal  No, carotid bruits, or masses Cardiac: RRR; no murmurs, rubs, or gallops, Tr  edema  Respiratory:  clear to auscultation bilaterally, normal work of breathing GI: soft, nontender, nondistended, + BS  No hepatomegaly  MS: no deformity Moving all extremities   Skin: warm  and dry, no rash Neuro:  Strength and sensation are intact Psych: euthymic mood, full affect   EKG:  EKG is not ordered today.     Lipid Panel    Component Value Date/Time   CHOL 156 05/20/2015 1223   TRIG 131 05/20/2015 1223   HDL 61 05/20/2015 1223   CHOLHDL 2.6 05/20/2015 1223   VLDL 26 05/20/2015 1223   LDLCALC 69 05/20/2015 1223   LDLDIRECT 72.6 11/10/2013 0949      Wt Readings from Last 3 Encounters:  05/07/17 118 lb 12.8 oz (53.9 kg)  10/16/16 119 lb 1.9 oz (54 kg)  05/01/16 119 lb 12.8 oz (54.3 kg)      ASSESSMENT AND PLAN:  1  CAD  Occasional CP  I would recomm 1/4 NTG as needed  She looks good today  Very energetic  2  HTN   BP is good  Keep on same regimen   3  CRI   Cr from October 1.55 4  HL    Lipids good in Feb 2017 69  Will check on return  Keep on ssame meds       /U in clinic in September    Current medicines are reviewed at length with the patient  today.  The patient does not have concerns regarding medicines.  Signed, Dorris Carnes, MD  05/07/2017 2:35 PM    [facility] [place], [place],   [address] Phone: [phone]; Fax: [phone]

## 2017-05-07 NOTE — Patient Instructions (Signed)
Your physician recommends that you continue on your current medications as directed. Please refer to the Current Medication list given to you today. Your physician wants you to follow-up in: September, 2019 with Dr. Harrington Challenger.  You will receive a reminder letter in the mail two months in advance. If you don't receive a letter, please call our office to schedule the follow-up appointment.

## 2017-06-04 ENCOUNTER — Other Ambulatory Visit: Payer: Self-pay | Admitting: Internal Medicine

## 2017-06-21 DIAGNOSIS — M25552 Pain in left hip: Secondary | ICD-10-CM | POA: Diagnosis not present

## 2017-07-26 ENCOUNTER — Other Ambulatory Visit: Payer: Self-pay | Admitting: Internal Medicine

## 2017-08-01 DIAGNOSIS — E782 Mixed hyperlipidemia: Secondary | ICD-10-CM | POA: Diagnosis not present

## 2017-08-01 DIAGNOSIS — M810 Age-related osteoporosis without current pathological fracture: Secondary | ICD-10-CM | POA: Diagnosis not present

## 2017-08-01 DIAGNOSIS — I10 Essential (primary) hypertension: Secondary | ICD-10-CM | POA: Diagnosis not present

## 2017-08-01 DIAGNOSIS — M109 Gout, unspecified: Secondary | ICD-10-CM | POA: Diagnosis not present

## 2017-08-09 DIAGNOSIS — E782 Mixed hyperlipidemia: Secondary | ICD-10-CM | POA: Diagnosis not present

## 2017-08-09 DIAGNOSIS — R2240 Localized swelling, mass and lump, unspecified lower limb: Secondary | ICD-10-CM | POA: Diagnosis not present

## 2017-08-09 DIAGNOSIS — D509 Iron deficiency anemia, unspecified: Secondary | ICD-10-CM | POA: Diagnosis not present

## 2017-08-09 DIAGNOSIS — M810 Age-related osteoporosis without current pathological fracture: Secondary | ICD-10-CM | POA: Diagnosis not present

## 2017-08-09 DIAGNOSIS — M109 Gout, unspecified: Secondary | ICD-10-CM | POA: Diagnosis not present

## 2017-08-09 DIAGNOSIS — R944 Abnormal results of kidney function studies: Secondary | ICD-10-CM | POA: Diagnosis not present

## 2017-08-09 DIAGNOSIS — R1310 Dysphagia, unspecified: Secondary | ICD-10-CM | POA: Diagnosis not present

## 2017-08-09 DIAGNOSIS — S81801A Unspecified open wound, right lower leg, initial encounter: Secondary | ICD-10-CM | POA: Diagnosis not present

## 2017-08-09 DIAGNOSIS — R809 Proteinuria, unspecified: Secondary | ICD-10-CM | POA: Diagnosis not present

## 2017-08-09 DIAGNOSIS — K5900 Constipation, unspecified: Secondary | ICD-10-CM | POA: Diagnosis not present

## 2017-08-09 DIAGNOSIS — S51801A Unspecified open wound of right forearm, initial encounter: Secondary | ICD-10-CM | POA: Diagnosis not present

## 2017-08-09 DIAGNOSIS — I10 Essential (primary) hypertension: Secondary | ICD-10-CM | POA: Diagnosis not present

## 2017-09-20 DIAGNOSIS — R2240 Localized swelling, mass and lump, unspecified lower limb: Secondary | ICD-10-CM | POA: Diagnosis not present

## 2017-09-20 DIAGNOSIS — D508 Other iron deficiency anemias: Secondary | ICD-10-CM | POA: Diagnosis not present

## 2017-09-20 DIAGNOSIS — Z6821 Body mass index (BMI) 21.0-21.9, adult: Secondary | ICD-10-CM | POA: Diagnosis not present

## 2017-09-20 DIAGNOSIS — D649 Anemia, unspecified: Secondary | ICD-10-CM | POA: Diagnosis not present

## 2017-09-20 DIAGNOSIS — K5900 Constipation, unspecified: Secondary | ICD-10-CM | POA: Diagnosis not present

## 2017-09-20 DIAGNOSIS — M109 Gout, unspecified: Secondary | ICD-10-CM | POA: Diagnosis not present

## 2017-09-20 DIAGNOSIS — R944 Abnormal results of kidney function studies: Secondary | ICD-10-CM | POA: Diagnosis not present

## 2017-09-20 DIAGNOSIS — I10 Essential (primary) hypertension: Secondary | ICD-10-CM | POA: Diagnosis not present

## 2017-11-29 ENCOUNTER — Encounter: Payer: Self-pay | Admitting: Internal Medicine

## 2017-12-08 ENCOUNTER — Emergency Department (HOSPITAL_COMMUNITY): Payer: Medicare Other

## 2017-12-08 ENCOUNTER — Encounter (HOSPITAL_COMMUNITY): Payer: Self-pay | Admitting: Emergency Medicine

## 2017-12-08 ENCOUNTER — Emergency Department (HOSPITAL_COMMUNITY)
Admission: EM | Admit: 2017-12-08 | Discharge: 2017-12-08 | Disposition: A | Discharge status: Home / Self Care | Payer: Medicare Other | Attending: Emergency Medicine | Admitting: Emergency Medicine

## 2017-12-08 ENCOUNTER — Other Ambulatory Visit: Payer: Self-pay

## 2017-12-08 ENCOUNTER — Telehealth: Payer: Self-pay | Admitting: Gastroenterology

## 2017-12-08 DIAGNOSIS — K209 Esophagitis, unspecified without bleeding: Secondary | ICD-10-CM

## 2017-12-08 DIAGNOSIS — Z79899 Other long term (current) drug therapy: Secondary | ICD-10-CM | POA: Diagnosis not present

## 2017-12-08 DIAGNOSIS — R1310 Dysphagia, unspecified: Secondary | ICD-10-CM | POA: Diagnosis not present

## 2017-12-08 DIAGNOSIS — I2510 Atherosclerotic heart disease of native coronary artery without angina pectoris: Secondary | ICD-10-CM | POA: Diagnosis not present

## 2017-12-08 DIAGNOSIS — I129 Hypertensive chronic kidney disease with stage 1 through stage 4 chronic kidney disease, or unspecified chronic kidney disease: Secondary | ICD-10-CM | POA: Insufficient documentation

## 2017-12-08 DIAGNOSIS — N183 Chronic kidney disease, stage 3 (moderate): Secondary | ICD-10-CM | POA: Insufficient documentation

## 2017-12-08 DIAGNOSIS — Z8551 Personal history of malignant neoplasm of bladder: Secondary | ICD-10-CM | POA: Diagnosis not present

## 2017-12-08 DIAGNOSIS — R05 Cough: Secondary | ICD-10-CM | POA: Diagnosis not present

## 2017-12-08 LAB — CBC WITH DIFFERENTIAL/PLATELET
BASOS ABS: 0 10*3/uL (ref 0.0–0.1)
Basophils Relative: 0 %
EOS PCT: 2 %
Eosinophils Absolute: 0.2 10*3/uL (ref 0.0–0.7)
HEMATOCRIT: 35.4 % — AB (ref 36.0–46.0)
Hemoglobin: 11.3 g/dL — ABNORMAL LOW (ref 12.0–15.0)
LYMPHS PCT: 15 %
Lymphs Abs: 1.7 10*3/uL (ref 0.7–4.0)
MCH: 31.6 pg (ref 26.0–34.0)
MCHC: 31.9 g/dL (ref 30.0–36.0)
MCV: 98.9 fL (ref 78.0–100.0)
MONO ABS: 0.7 10*3/uL (ref 0.1–1.0)
Monocytes Relative: 6 %
NEUTROS ABS: 9 10*3/uL — AB (ref 1.7–7.7)
Neutrophils Relative %: 77 %
PLATELETS: 182 10*3/uL (ref 150–400)
RBC: 3.58 MIL/uL — ABNORMAL LOW (ref 3.87–5.11)
RDW: 14.6 % (ref 11.5–15.5)
WBC: 11.7 10*3/uL — ABNORMAL HIGH (ref 4.0–10.5)

## 2017-12-08 LAB — I-STAT TROPONIN, ED: Troponin i, poc: 0.01 ng/mL (ref 0.00–0.08)

## 2017-12-08 LAB — COMPREHENSIVE METABOLIC PANEL
ALBUMIN: 3.6 g/dL (ref 3.5–5.0)
ALK PHOS: 59 U/L (ref 38–126)
ALT: 17 U/L (ref 0–44)
AST: 26 U/L (ref 15–41)
Anion gap: 7 (ref 5–15)
BILIRUBIN TOTAL: 0.8 mg/dL (ref 0.3–1.2)
BUN: 42 mg/dL — AB (ref 8–23)
CO2: 21 mmol/L — AB (ref 22–32)
Calcium: 8.6 mg/dL — ABNORMAL LOW (ref 8.9–10.3)
Chloride: 110 mmol/L (ref 98–111)
Creatinine, Ser: 1.56 mg/dL — ABNORMAL HIGH (ref 0.44–1.00)
GFR calc Af Amer: 31 mL/min — ABNORMAL LOW (ref >60)
GFR calc non Af Amer: 27 mL/min — ABNORMAL LOW (ref >60)
GLUCOSE: 129 mg/dL — AB (ref 70–99)
POTASSIUM: 4.3 mmol/L (ref 3.5–5.1)
SODIUM: 138 mmol/L (ref 135–145)
TOTAL PROTEIN: 6.7 g/dL (ref 6.5–8.1)

## 2017-12-08 MED ORDER — SUCRALFATE 1 GM/10ML PO SUSP: 1.0000 g | Freq: Three times a day (TID) | ORAL | 0 refills | Status: DC

## 2017-12-08 MED ORDER — OMEPRAZOLE 20 MG PO CPDR: 20.0000 mg | DELAYED_RELEASE_CAPSULE | Freq: Every day | ORAL | 1 refills | Status: DC

## 2017-12-08 NOTE — ED Notes (Signed)
Tray given to patient

## 2017-12-08 NOTE — ED Triage Notes (Addendum)
Pt c/o difficulty swallowing and sore throat. Pt on a softened diet due to hx of dysphagia. Pt states symptoms worsened after she took her baby ASA this morning. Airway patent. Pt noted to have congested cough.

## 2017-12-08 NOTE — ED Notes (Signed)
Family at bedside. 

## 2017-12-08 NOTE — ED Notes (Signed)
Pt tolerated fluids and crackers well.

## 2017-12-08 NOTE — Discharge Instructions (Addendum)
Make an appointment to follow-up with gastroenterology.  Their office phone number and address provided above.  Go ahead and take the omeprazole.  This is to help settle the esophagus down.  And the Carafate is helped designed to coat the esophagus to help the healing.  Both of these have been improved by gastroenterology.  Return for any new or worse symptoms.

## 2017-12-08 NOTE — Telephone Encounter (Signed)
Called earlier today from ED about this patient. H/o solid food dysphagia, has not been evaluated. Swallowed baby aspirin today and felt it lodge in the throat area. Developed burning sensation in the throat and upper esophagus. Tolerating liquids/solids in the ED and plans for D/C. Advised to put on PPI/Carafate until seen in the office.   Please make OV for week of 12/10/17. May use urgent.

## 2017-12-08 NOTE — ED Provider Notes (Signed)
[facility] EMERGENCY DEPARTMENT Provider Note   CSN: [id number] Arrival date & time: 12/08/17  1048     History   Chief Complaint Chief Complaint  Patient presents with  . Dysphagia    HPI Carly [name] is a 84 y.o. female.  Patient presenting with a dysphasia complaint.  Some hoarseness and burning sensation in her throat.  Patient stated that she had her a morning doughnut and coffee about 5 minutes after that she went to a different room to take her pills.  Patient states that the donut coffee went down fine.  And then when she was swallowing her baby aspirin nonchewable type it got stuck in her throat she started to cough and she had a burning sensation in her neck area.  And then down to about the midportion of her chest.  This occurred at about 8:30 in the morning.  Burning sensation has continued and she has some hoarseness.  Patient is not sure if the pill got stuck or if she suddenly developed other symptoms she was worried about having pneumonia.  Patient denied having trouble swallowing in the past.  However family members confirm that she has had some difficulty swallowing things for a period of time.  Which includes regular food.  No official work-up for this.     Past Medical History:  Diagnosis Date  . CAD (coronary artery disease)    a. s/p PTCA/stent to LAD and PTCA of ramus in 2003. b. normal nuc 10/2012 with EF 87%  . Carotid arterial disease ([place])    a. 07/2014 - duplex 19-37% RICA, 9-02% LICA.  Marland Kitchen CKD (chronic kidney disease), stage III ([place])   . Cystocele   . Dyslipidemia   . Gout   . HTN (hypertension)   . Transitional cell carcinoma ([place])    a. 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  . Vaginal wall prolapse     Patient Active Problem List   Diagnosis Date Noted  . Pain in the chest   . Chest pain 11/03/2012  . Chronic kidney disease, stage III (moderate) ([place]) 11/03/2012  . Dizziness 04/28/2011    . Carotid arterial disease ([place]) 06/08/2009  . HLD (hyperlipidemia) 10/02/2008  . HYPERTENSION, BENIGN 10/02/2008  . CAD, NATIVE VESSEL 10/02/2008  . CLOSED FRACTURE OF LATERAL MALLEOLUS 01/06/2008    Past Surgical History:  Procedure Laterality Date  . APPENDECTOMY    . CORONARY ANGIOPLASTY WITH STENT PLACEMENT    . NEPHRECTOMY     right     OB History   None      Home Medications    Prior to Admission medications   Medication Sig Start Date End Date Taking? Authorizing Provider  allopurinol (ZYLOPRIM) 100 MG tablet TAKE ONE TABLET BY MOUTH ONCE DAILY. 11/13/12  Yes Kefalas, Manon Hilding, PA-C  amLODipine-benazepril (LOTREL) 5-20 MG capsule Take 1 capsule by mouth daily. TAKE (1) CAPSULE BY MOUTH ONCE DAILY. 07/26/17  Yes [name], MD  aspirin 81 MG tablet Take 81 mg by mouth daily.     Yes [provider]  metoprolol succinate (TOPROL-XL) 50 MG 24 hr tablet TAKE 1 TABLET BY MOUTH ONCE DAILY. 04/05/17  Yes [name], MD  nitroGLYCERIN (NITROSTAT) 0.4 MG SL tablet Place 1 tablet (0.4 mg total) under the tongue every 5 (five) minutes as needed for chest pain. 05/01/16  Yes [name], MD  raloxifene (EVISTA) 60 MG tablet Take 60 mg by mouth  daily.     Yes [provider]  simvastatin (ZOCOR) 10 MG tablet TAKE (1) TABLET BY MOUTH AT BEDTIME. 06/04/17  Yes [name], MD  omeprazole (PRILOSEC) 20 MG capsule Take 1 capsule (20 mg total) by mouth daily. 12/08/17   Fredia Sorrow, MD  sucralfate (CARAFATE) 1 GM/10ML suspension Take 10 mLs (1 g total) by mouth 4 (four) times daily -  with meals and at bedtime. 12/08/17   Fredia Sorrow, MD    Family History Family History  Problem Relation Age of Onset  . Heart attack Father   . Diabetes Father   . Cancer Unknown   . Heart disease Unknown     Social History Social History   Tobacco Use  . Smoking status: Never Smoker  . Smokeless tobacco: Never Used  Substance Use Topics  . Alcohol use: No  .  Drug use: No     Allergies   Patient has no known allergies.   Review of Systems Review of Systems  Constitutional: Negative for fever.  HENT: Positive for sore throat, trouble swallowing and voice change. Negative for congestion.   Eyes: Negative for redness.  Respiratory: Positive for cough.   Cardiovascular: Negative for chest pain.  Gastrointestinal: Negative for abdominal pain, nausea and vomiting.  Genitourinary: Negative for dysuria.  Musculoskeletal: Positive for neck pain. Negative for back pain.  Skin: Negative for rash.  Neurological: Negative for syncope and headaches.  Hematological: Does not bruise/bleed easily.  Psychiatric/Behavioral: Positive for confusion.     Physical Exam Updated Vital Signs BP (!) 179/64   Pulse 87   Temp 97.8 F (36.6 C)   Resp 20   Ht 1.524 m (5')   Wt 54.4 kg   SpO2 96%   BMI 23.44 kg/m   Physical Exam  Constitutional: She is oriented to person, place, and time. She appears well-developed and well-nourished. No distress.  HENT:  Head: Normocephalic and atraumatic.  Mouth/Throat: Oropharynx is clear and moist. No oropharyngeal exudate.  No evidence of any significant pharyngeal abnormalities.  Eyes: Pupils are equal, round, and reactive to light. EOM are normal.  Neck: Neck supple.  Cardiovascular: Normal rate, regular rhythm and normal heart sounds.  Pulmonary/Chest: Effort normal and breath sounds normal.  Abdominal: Soft. Bowel sounds are normal. There is no tenderness.  Musculoskeletal: Normal range of motion.  Neurological: She is alert and oriented to person, place, and time. No cranial nerve deficit or sensory deficit. She exhibits normal muscle tone. Coordination normal.  Skin: Skin is warm.  Nursing note and vitals reviewed.    ED Treatments / Results  Labs (all labs ordered are listed, but only abnormal results are displayed) Labs Reviewed  COMPREHENSIVE METABOLIC PANEL - Abnormal; Notable for the following  components:      Result Value   CO2 21 (*)    Glucose, Bld 129 (*)    BUN 42 (*)    Creatinine, Ser 1.56 (*)    Calcium 8.6 (*)    GFR calc non Af Amer 27 (*)    GFR calc Af Amer 31 (*)    All other components within normal limits  CBC WITH DIFFERENTIAL/PLATELET - Abnormal; Notable for the following components:   WBC 11.7 (*)    RBC 3.58 (*)    Hemoglobin 11.3 (*)    HCT 35.4 (*)    Neutro Abs 9.0 (*)    All other components within normal limits  I-STAT TROPONIN, ED    EKG EKG Interpretation  Date/Time:  Saturday December 08 2017 13:04:30 EDT Ventricular Rate:  83 PR Interval:    QRS Duration: 82 QT Interval:  386 QTC Calculation: 201 R Axis:   -21 Text Interpretation:  Sinus rhythm Borderline left axis deviation Confirmed by Fredia Sorrow [phone]) on 12/08/2017 1:07:30 PM   Radiology Dg Chest 2 View  Result Date: 12/08/2017 CLINICAL DATA:  Cough. EXAM: CHEST - 2 VIEW COMPARISON:  PA and lateral chest 10/24/2014.  CT chest 11/03/2012. FINDINGS: The lungs are clear. Heart size is normal. No pneumothorax or pleural effusion. Aortic atherosclerosis is noted. No acute or focal bony abnormality. IMPRESSION: No acute disease. Atherosclerosis. Electronically Signed   By: Inge Rise M.D.   On: 12/08/2017 13:38    Procedures Procedures (including critical care time)  Medications Ordered in ED Medications - No data to display   Initial Impression / Assessment and Plan / ED Course  I have reviewed the triage vital signs and the nursing notes.  Pertinent labs & imaging results that were available during my care of the patient were reviewed by me and considered in my medical decision making (see chart for details).    Patient most likely has some esophagitis and irritation of vocal cord secondary to the dissolving baby aspirin.  Patient given liquids soft food solid food here tolerated all fine.  Just continues to have the burning sensation labs without significant  abnormalities other than some renal dysfunction which is baseline.  In addition chest x-ray negative no acute findings.  Patient even had troponin done several hours after the event that was also negative.  Discussed with [facility] assistant on-call for gastroenterology.  Recommended proton pump inhibitor and Carafate solution and follow-up with them in the office.  They are aware that she has had some swallowing problems for a while.  Patient did have a slight leukocytosis at 11,000.  And a mild anemia.  Patient nontoxic no acute distress.  Family okay with this patient okay with this.  Patient able to ambulate here fine with walker.  Patient actually very youthful and functional for her age.   Final Clinical Impressions(s) / ED Diagnoses   Final diagnoses:  Esophagitis, acute    ED Discharge Orders         Ordered    omeprazole (PRILOSEC) 20 MG capsule  Daily     12/08/17 1641    sucralfate (CARAFATE) 1 GM/10ML suspension  3 times daily with meals & bedtime     12/08/17 1641           Fredia Sorrow, MD 12/08/17 1713

## 2017-12-08 NOTE — ED Notes (Signed)
Pt tolerated meal well. No trouble with swallowing

## 2017-12-11 NOTE — Telephone Encounter (Signed)
PATIENT COMING IN TOMORROW 12/12/17

## 2017-12-11 NOTE — Telephone Encounter (Signed)
Noted. Thanks.

## 2017-12-12 ENCOUNTER — Ambulatory Visit (INDEPENDENT_AMBULATORY_CARE_PROVIDER_SITE_OTHER): Payer: Medicare Other | Admitting: Gastroenterology

## 2017-12-12 ENCOUNTER — Encounter: Payer: Self-pay | Admitting: Gastroenterology

## 2017-12-12 DIAGNOSIS — I25709 Atherosclerosis of coronary artery bypass graft(s), unspecified, with unspecified angina pectoris: Secondary | ICD-10-CM

## 2017-12-12 DIAGNOSIS — R1310 Dysphagia, unspecified: Secondary | ICD-10-CM | POA: Diagnosis not present

## 2017-12-12 MED ORDER — OMEPRAZOLE 20 MG PO CPDR: 20.0000 mg | DELAYED_RELEASE_CAPSULE | Freq: Every day | ORAL | 0 refills | Status: DC

## 2017-12-12 NOTE — Patient Instructions (Addendum)
Please complete four to six weeks of omeprazole once daily before breakfast. The ER doctor gave you only 2 weeks worth so I sent in a refill for you.   If you decide to pursue swallowing xray please let me know! Let us know if you have any ongoing swallowing concerns after completing medication.   Follow these instructions at home: Eating and drinking  Try to eat soft food that is easier to swallow.  Follow any diet changes as told by your health care provider.  Cut your food into small pieces and eat slowly.  Eat and drink only when you are sitting upright.  Sit upright for at least 30 minutes after swallowing medications and for at least 2 hours after eating solid food.   Contact a health care provider if:  You lose weight because you cannot swallow.  You cough when you drink liquids (aspiration).  You cough up partially digested food. Get help right away if:  You cannot swallow your saliva.  You have shortness of breath or a fever, or both.  You have a hoarse voice and also have trouble swallowing. Summary  Dysphagia is trouble swallowing. This condition occurs when solids and liquids stick in a person's throat on the way down to the stomach, or when food takes longer to get to the stomach.  Dysphagia has many possible causes and symptoms.  Treatment for dysphagia depends on the cause of the condition. This information is not intended to replace advice given to you by your health care provider. Make sure you discuss any questions you have with your health care provider.

## 2017-12-12 NOTE — Assessment & Plan Note (Addendum)
84 y/o female presented with acute on chronic dysphagia this weekend to ED. Developed burning in the throat after an ASA got stuck. Complains of difficulty off/on with bread and meat. We discussed options for evaluation including barium pill esophagram or upper endoscopy and patient is not interested in either at this time. Daughters in agreement as well. She feels better. I have advised her to complete 4-6 weeks of omeprazole and provided additional RX. At any time if they decide to pursue esophagram or endoscopy they should let me know. Would encourage her to let us know if she is not doing better with regards to swallowing over the next few weeks on omeprazole. All parties voiced understanding.

## 2017-12-12 NOTE — Progress Notes (Signed)
Primary Care Physician:  Celene Squibb, MD  Primary Gastroenterologist:  Garfield Cornea, MD   Chief Complaint  Patient presents with  . Dysphagia    on saturday felt baby aspirin had got stuck. Went to the ED.     HPI:  Carly [name] is a 84 y.o. female here for further evaluation of dysphagia.  She presents with 2 daughters.  I received phone call from the ED physician over the weekend, patient presented after swallowing a baby aspirin and feeling it get lodged in the throat area.  Developed burning sensation in the throat and upper esophagus.  Also reported chronic solid food dysphagia.  In the ED she tolerated liquids and solids, chest x-ray unremarkable, troponin negative, BUN 42/creatinine 1.56, white blood cell count 11,700, hemoglobin 11.3. Felt to be stable for discharge and went out on PPI and Carafate.  She could not afford the Carafate at the pharmacy.  She is been on omeprazole 20 mg daily for couple of days.  She presents stating that she has had some issues swallowing bread and meat off and on for quite some time.  Daughters show concern regarding this matter.  Patient not as concerned. Patient states when she swallows it it feels like it stuck in the throat she has to repetitively swallow to get it down.  This is what happened with her aspirin the other day.  Sometimes large pill she has difficulties but for the most part she does well otherwise.  She denies significant heartburn.  Denies abdominal pain.   Her weight is down about 2 pounds since January of this year. No melena, brbpr.   No blood in the stool or melena. Patient is not interested in pursuing endoscopy nor are daughters.   Patient states she shouldn't have went to ER. She was just getting a cold. Throat is better now. No coughing or difficulty breathing. Hoarseness is gone as well.   Current Outpatient Medications  Medication Sig Dispense Refill  . allopurinol (ZYLOPRIM) 100 MG tablet TAKE ONE TABLET BY MOUTH ONCE  DAILY. 30 tablet 5  . amLODipine-benazepril (LOTREL) 5-20 MG capsule Take 1 capsule by mouth daily. TAKE (1) CAPSULE BY MOUTH ONCE DAILY. 90 capsule 2  . aspirin 81 MG tablet Take 81 mg by mouth daily.      . metoprolol succinate (TOPROL-XL) 50 MG 24 hr tablet TAKE 1 TABLET BY MOUTH ONCE DAILY. 90 tablet 1  . nitroGLYCERIN (NITROSTAT) 0.4 MG SL tablet Place 1 tablet (0.4 mg total) under the tongue every 5 (five) minutes as needed for chest pain. 25 tablet 3  . omeprazole (PRILOSEC) 20 MG capsule Take 1 capsule (20 mg total) by mouth daily. 14 capsule 1  . raloxifene (EVISTA) 60 MG tablet Take 60 mg by mouth daily.      . simvastatin (ZOCOR) 10 MG tablet TAKE (1) TABLET BY MOUTH AT BEDTIME. 90 tablet 3         0   No current facility-administered medications for this visit.     Allergies as of 12/12/2017  . (No Known Allergies)    Past Medical History:  Diagnosis Date  . CAD (coronary artery disease)    a. s/p PTCA/stent to LAD and PTCA of ramus in 2003. b. normal nuc 10/2012 with EF 87%  . Carotid arterial disease ([place])    a. 07/2014 - duplex 16-10% RICA, 9-60% LICA.  Marland Kitchen CKD (chronic kidney disease), stage III ([place])   . Cystocele   . Dyslipidemia   .  Gout   . HTN (hypertension)   . Transitional cell carcinoma ([place])    a. 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  . Vaginal wall prolapse     Past Surgical History:  Procedure Laterality Date  . APPENDECTOMY    . CORONARY ANGIOPLASTY WITH STENT PLACEMENT    . NEPHRECTOMY     right    Family History  Problem Relation Age of Onset  . Heart attack Father   . Diabetes Father   . Cancer Unknown   . Heart disease Unknown     Social History   Socioeconomic History  . Marital status: Widowed    Spouse name: Not on file  . Number of children: 2  . Years of education: Not on file  . Highest education level: Not on file  Occupational History  . Not on file  Social Needs  .  Financial resource strain: Not on file  . Food insecurity:    Worry: Not on file    Inability: Not on file  . Transportation needs:    Medical: Not on file    Non-medical: Not on file  Tobacco Use  . Smoking status: Never Smoker  . Smokeless tobacco: Never Used  Substance and Sexual Activity  . Alcohol use: No  . Drug use: No  . Sexual activity: Never  Lifestyle  . Physical activity:    Days per week: Not on file    Minutes per session: Not on file  . Stress: Not on file  Relationships  . Social connections:    Talks on phone: Not on file    Gets together: Not on file    Attends religious service: Not on file    Active member of club or organization: Not on file    Attends meetings of clubs or organizations: Not on file    Relationship status: Not on file  . Intimate partner violence:    Fear of current or ex partner: Not on file    Emotionally abused: Not on file    Physically abused: Not on file    Forced sexual activity: Not on file  Other Topics Concern  . Not on file  Social History Narrative  . Not on file      ROS:  General: Negative for anorexia, weight loss, fever, chills, fatigue, weakness. Eyes: Negative for vision changes.  ENT: Negative for hoarseness,   nasal congestion. Hoarseness resolved over the weekend.  CV: Negative for chest pain, angina, palpitations, dyspnea on exertion, peripheral edema.  Respiratory: Negative for dyspnea at rest, dyspnea on exertion, cough, sputum, wheezing.  GI: See history of present illness. GU:  Negative for dysuria, hematuria, urinary incontinence, urinary frequency, nocturnal urination.  WU:[id number] for joint pain. no low back pain.  Derm: Negative for rash or itching.  Neuro: Negative for weakness, abnormal sensation, seizure, frequent headaches, memory loss, confusion.  Psych: Negative for anxiety, depression, suicidal ideation, hallucinations.  Endo: Negative for unusual weight change.  Heme: Negative for bruising  or bleeding. Allergy: Negative for rash or hives.    Physical Examination:  BP (!) 160/80 (BP Location: Left Arm, Cuff Size: Normal)   Pulse 78   Temp (!) 97.2 F (36.2 C) (Oral)   Ht 5' (1.524 m)   Wt 116 lb (52.6 kg)   BMI 22.65 kg/m    General: Well-nourished, well-developed in no acute distress.  Head: Normocephalic, atraumatic.   Eyes: Conjunctiva pink, no icterus. Mouth: Oropharyngeal mucosa  moist and pink , no lesions erythema or exudate. Neck: Supple without thyromegaly, masses, or lymphadenopathy.  Lungs: Clear to auscultation bilaterally.  Heart: Regular rate and rhythm, no murmurs rubs or gallops.  Abdomen: Bowel sounds are normal, nontender, nondistended, no hepatosplenomegaly or masses, no abdominal bruits or    hernia , no rebound or guarding.   Rectal: not peformed Extremities: No lower extremity edema. No clubbing or deformities.  Neuro: Alert and oriented x 4 , grossly normal neurologically.  Skin: Warm and dry, no rash or jaundice.   Psych: Alert and cooperative, normal mood and affect.  Labs: Lab Results  Component Value Date   CREATININE 1.56 (H) 12/08/2017   BUN 42 (H) 12/08/2017   NA 138 12/08/2017   K 4.3 12/08/2017   CL 110 12/08/2017   CO2 21 (L) 12/08/2017   Lab Results  Component Value Date   WBC 11.7 (H) 12/08/2017   HGB 11.3 (L) 12/08/2017   HCT 35.4 (L) 12/08/2017   MCV 98.9 12/08/2017   PLT 182 12/08/2017   Lab Results  Component Value Date   ALT 17 12/08/2017   AST 26 12/08/2017   ALKPHOS 59 12/08/2017   BILITOT 0.8 12/08/2017     Imaging Studies: Dg Chest 2 View  Result Date: 12/08/2017 CLINICAL DATA:  Cough. EXAM: CHEST - 2 VIEW COMPARISON:  PA and lateral chest 10/24/2014.  CT chest 11/03/2012. FINDINGS: The lungs are clear. Heart size is normal. No pneumothorax or pleural effusion. Aortic atherosclerosis is noted. No acute or focal bony abnormality. IMPRESSION: No acute disease. Atherosclerosis. Electronically Signed   By:  Inge Rise M.D.   On: 12/08/2017 13:38

## 2017-12-13 NOTE — Progress Notes (Signed)
cc'ed to pcp °

## 2017-12-20 DIAGNOSIS — K5900 Constipation, unspecified: Secondary | ICD-10-CM | POA: Diagnosis not present

## 2017-12-20 DIAGNOSIS — I10 Essential (primary) hypertension: Secondary | ICD-10-CM | POA: Diagnosis not present

## 2017-12-20 DIAGNOSIS — R2240 Localized swelling, mass and lump, unspecified lower limb: Secondary | ICD-10-CM | POA: Diagnosis not present

## 2017-12-20 DIAGNOSIS — M109 Gout, unspecified: Secondary | ICD-10-CM | POA: Diagnosis not present

## 2017-12-20 DIAGNOSIS — D509 Iron deficiency anemia, unspecified: Secondary | ICD-10-CM | POA: Diagnosis not present

## 2017-12-20 DIAGNOSIS — M810 Age-related osteoporosis without current pathological fracture: Secondary | ICD-10-CM | POA: Diagnosis not present

## 2017-12-20 DIAGNOSIS — R944 Abnormal results of kidney function studies: Secondary | ICD-10-CM | POA: Diagnosis not present

## 2017-12-20 DIAGNOSIS — R809 Proteinuria, unspecified: Secondary | ICD-10-CM | POA: Diagnosis not present

## 2017-12-20 DIAGNOSIS — S81801A Unspecified open wound, right lower leg, initial encounter: Secondary | ICD-10-CM | POA: Diagnosis not present

## 2017-12-20 DIAGNOSIS — E782 Mixed hyperlipidemia: Secondary | ICD-10-CM | POA: Diagnosis not present

## 2017-12-20 DIAGNOSIS — R1310 Dysphagia, unspecified: Secondary | ICD-10-CM | POA: Diagnosis not present

## 2017-12-20 DIAGNOSIS — S51801A Unspecified open wound of right forearm, initial encounter: Secondary | ICD-10-CM | POA: Diagnosis not present

## 2017-12-24 ENCOUNTER — Ambulatory Visit (INDEPENDENT_AMBULATORY_CARE_PROVIDER_SITE_OTHER): Payer: Medicare Other | Admitting: Internal Medicine

## 2017-12-24 ENCOUNTER — Encounter: Payer: Self-pay | Admitting: Internal Medicine

## 2017-12-24 VITALS — BP 142/70 | HR 70 | Ht 60.0 in | Wt 114.6 lb

## 2017-12-24 DIAGNOSIS — I10 Essential (primary) hypertension: Secondary | ICD-10-CM | POA: Diagnosis not present

## 2017-12-24 DIAGNOSIS — I2510 Atherosclerotic heart disease of native coronary artery without angina pectoris: Secondary | ICD-10-CM | POA: Diagnosis not present

## 2017-12-24 DIAGNOSIS — R296 Repeated falls: Secondary | ICD-10-CM

## 2017-12-24 DIAGNOSIS — E782 Mixed hyperlipidemia: Secondary | ICD-10-CM

## 2017-12-24 NOTE — Patient Instructions (Signed)
Your physician recommends that you continue on your current medications as directed. Please refer to the Current Medication list given to you today.  You have been referred to [facility] for balance/strengthening exercises.

## 2017-12-24 NOTE — Progress Notes (Signed)
Cardiology Office Note   Date:  12/24/2017   ID:  Tracie Harrier, DOB Mar 28, 1919, MRN [id number]  PCP:  Celene Squibb, MD  Cardiologist:   Dorris Carnes, MD    F/u of CAD     History of Present Illness: Carly [name] is a 84 y.o. female with a history ofCAD (s/p PTCA/Stent to the LAD and PTCA to ramus in 2003). Sestamibi was negative fir ischemia Also a hsitory of HTN and HL  The patient was last in clinic about 6 months ago   Since seen breathing is OK   Upset that she cant do much with her hands   Family says she has fallen   Feet get caught up on each other      She notes occasional chest pains   Short   Also, food occasionally gets stuck   Seen in ED on 8/31   ? pll got stuck   Told to take prilosec after for a few wks    Current Meds  Medication Sig  . allopurinol (ZYLOPRIM) 100 MG tablet TAKE ONE TABLET BY MOUTH ONCE DAILY.  Marland Kitchen amLODipine-benazepril (LOTREL) 5-20 MG capsule Take 1 capsule by mouth daily. TAKE (1) CAPSULE BY MOUTH ONCE DAILY.  Marland Kitchen aspirin 81 MG tablet Take 81 mg by mouth daily.    . metoprolol succinate (TOPROL-XL) 50 MG 24 hr tablet TAKE 1 TABLET BY MOUTH ONCE DAILY.  . nitroGLYCERIN (NITROSTAT) 0.4 MG SL tablet Place 1 tablet (0.4 mg total) under the tongue every 5 (five) minutes as needed for chest pain.  Marland Kitchen omeprazole (PRILOSEC) 20 MG capsule Take 1 capsule (20 mg total) by mouth daily before breakfast.  . raloxifene (EVISTA) 60 MG tablet Take 60 mg by mouth daily.    . simvastatin (ZOCOR) 10 MG tablet TAKE (1) TABLET BY MOUTH AT BEDTIME.     Allergies:   Patient has no known allergies.   Past Medical History:  Diagnosis Date  . CAD (coronary artery disease)    a. s/p PTCA/stent to LAD and PTCA of ramus in 2003. b. normal nuc 10/2012 with EF 87%  . Carotid arterial disease ([place])    a. 07/2014 - duplex 23-95% RICA, 3-20% LICA.  Marland Kitchen CKD (chronic kidney disease), stage III ([place])   . Cystocele   . Dyslipidemia   . Gout   . HTN (hypertension)   .  Transitional cell carcinoma ([place])    a. 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  . Vaginal wall prolapse     Past Surgical History:  Procedure Laterality Date  . APPENDECTOMY    . CORONARY ANGIOPLASTY WITH STENT PLACEMENT    . NEPHRECTOMY     right     Social History:  The patient  reports that she has never smoked. She has never used smokeless tobacco. She reports that she does not drink alcohol or use drugs.   Family History:  The patient's family history includes Cancer in her unknown relative; Diabetes in her father; Heart attack in her father; Heart disease in her unknown relative.    ROS:  Please see the history of present illness. All other systems are reviewed and  Negative to the above problem except as noted.    PHYSICAL EXAM: VS:  BP (!) 142/70   Pulse 70   Ht 5' (1.524 m)   Wt 114 lb 9.6 oz (52 kg)   SpO2 98%   BMI 22.38 kg/m  GEN: Well nourished, well developed, in no acute distress  HEENT: normal  Neck: JVP is not elevtated  No, carotid bruits, or masses Cardiac: RRR; no murmurs, rubs, or gallops, Trivial  edema  Respiratory:  clear to auscultation bilaterally, normal work of breathing GI: soft, nontender, nondistended, + BS  No hepatomegaly  MS: no deformity Moving all extremities   Skin: warm and dry, no rash Neuro:  Strength and sensation are intact Psych: euthymic mood, full affect   EKG:  EKG is not ordered today.     Lipid Panel    Component Value Date/Time   CHOL 156 05/20/2015 1223   TRIG 131 05/20/2015 1223   HDL 61 05/20/2015 1223   CHOLHDL 2.6 05/20/2015 1223   VLDL 26 05/20/2015 1223   LDLCALC 69 05/20/2015 1223   LDLDIRECT 72.6 11/10/2013 0949      Wt Readings from Last 3 Encounters:  12/24/17 114 lb 9.6 oz (52 kg)  12/12/17 116 lb (52.6 kg)  12/08/17 120 lb (54.4 kg)      ASSESSMENT AND PLAN:  1  CAD   No symptoms to sugg angna     2  HTN   BP is OK     3    HL    Last  LDL 76   Keep on ssame meds    4   Falls   Will refer to [facility]  F/U in spring    Current medicines are reviewed at length with the patient today.  The patient does not have concerns regarding medicines.  Signed, Dorris Carnes, MD  12/24/2017 11:36 AM    [address], [place], [place]  [address] Phone: [phone]; Fax: [phone]

## 2017-12-25 DIAGNOSIS — D509 Iron deficiency anemia, unspecified: Secondary | ICD-10-CM | POA: Diagnosis not present

## 2017-12-25 DIAGNOSIS — R296 Repeated falls: Secondary | ICD-10-CM | POA: Diagnosis not present

## 2017-12-25 DIAGNOSIS — R809 Proteinuria, unspecified: Secondary | ICD-10-CM | POA: Diagnosis not present

## 2017-12-25 DIAGNOSIS — S51801A Unspecified open wound of right forearm, initial encounter: Secondary | ICD-10-CM | POA: Diagnosis not present

## 2017-12-25 DIAGNOSIS — R2240 Localized swelling, mass and lump, unspecified lower limb: Secondary | ICD-10-CM | POA: Diagnosis not present

## 2017-12-25 DIAGNOSIS — M109 Gout, unspecified: Secondary | ICD-10-CM | POA: Diagnosis not present

## 2017-12-25 DIAGNOSIS — E782 Mixed hyperlipidemia: Secondary | ICD-10-CM | POA: Diagnosis not present

## 2017-12-25 DIAGNOSIS — R944 Abnormal results of kidney function studies: Secondary | ICD-10-CM | POA: Diagnosis not present

## 2017-12-25 DIAGNOSIS — M810 Age-related osteoporosis without current pathological fracture: Secondary | ICD-10-CM | POA: Diagnosis not present

## 2017-12-25 DIAGNOSIS — I10 Essential (primary) hypertension: Secondary | ICD-10-CM | POA: Diagnosis not present

## 2017-12-25 DIAGNOSIS — S81801A Unspecified open wound, right lower leg, initial encounter: Secondary | ICD-10-CM | POA: Diagnosis not present

## 2017-12-25 DIAGNOSIS — R1310 Dysphagia, unspecified: Secondary | ICD-10-CM | POA: Diagnosis not present

## 2018-01-02 ENCOUNTER — Other Ambulatory Visit: Payer: Self-pay | Admitting: Internal Medicine

## 2018-01-15 ENCOUNTER — Encounter: Payer: Self-pay | Admitting: Physical Therapy

## 2018-01-15 ENCOUNTER — Ambulatory Visit: Payer: Medicare Other | Attending: Internal Medicine | Admitting: Physical Therapy

## 2018-01-15 ENCOUNTER — Other Ambulatory Visit: Payer: Self-pay

## 2018-01-15 DIAGNOSIS — R293 Abnormal posture: Secondary | ICD-10-CM | POA: Insufficient documentation

## 2018-01-15 DIAGNOSIS — R2681 Unsteadiness on feet: Secondary | ICD-10-CM | POA: Insufficient documentation

## 2018-01-15 DIAGNOSIS — Z23 Encounter for immunization: Secondary | ICD-10-CM | POA: Diagnosis not present

## 2018-01-15 DIAGNOSIS — R296 Repeated falls: Secondary | ICD-10-CM | POA: Insufficient documentation

## 2018-01-15 DIAGNOSIS — R2689 Other abnormalities of gait and mobility: Secondary | ICD-10-CM | POA: Diagnosis not present

## 2018-01-15 DIAGNOSIS — M6281 Muscle weakness (generalized): Secondary | ICD-10-CM

## 2018-01-15 NOTE — Therapy (Signed)
[facility] [address] [place], [place], [address] Phone: [phone]   Fax:  [phone]  Physical Therapy Evaluation  Patient Details  Name: Carly [name] MRN: [id number] Date of Birth: 15-Sep-1918 Referring Provider (PT): Dr. Dorris Carnes   Encounter Date: 01/15/2018  PT End of Session - 01/15/18 1330    Visit Number  1    Number of Visits  7    Date for PT Re-Evaluation  03/01/18    Authorization Type  Medicare Part A& B- * Requires 10th visit PN    PT Start Time  1150    PT Stop Time  1240    PT Time Calculation (min)  50 min    Equipment Utilized During Treatment  Gait belt    Activity Tolerance  Patient tolerated treatment well    Behavior During Therapy  Anxious       Past Medical History:  Diagnosis Date  . CAD (coronary artery disease)    a. s/p PTCA/stent to LAD and PTCA of ramus in 2003. b. normal nuc 10/2012 with EF 87%  . Carotid arterial disease ([place])    a. 07/2014 - duplex 51-76% RICA, 1-60% LICA.  Marland Kitchen CKD (chronic kidney disease), stage III ([place])   . Cystocele   . Dyslipidemia   . Gout   . HTN (hypertension)   . Transitional cell carcinoma ([place])    a. 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  . Vaginal wall prolapse     Past Surgical History:  Procedure Laterality Date  . APPENDECTOMY    . CORONARY ANGIOPLASTY WITH STENT PLACEMENT    . NEPHRECTOMY     right    There were no vitals filed for this visit.   Subjective Assessment - 01/15/18 1153    Subjective  Reports living by herself and has experienced 2-3 falls in the past 6 months. Daughter reports her feet cannot keep up with her busy routine. Daughter reports she walks with her heels touching eachother with poor foot clearance. Reports pushing life line button after falls for assistance. Pt reports primarily staying at home and goes out to eat with her daughters 1x/wk relying on family  for transportation. Daughter reports her mom has trouble with fine motor skills and has poor grip strength. Daughter reports her having issues swallowing pills for past 10 years.     Patient is accompained by:  Family member   Daughter   Pertinent History  CAD, HTN, HL, CKD, Gout, Transitional Cell Carcinoma     Limitations  Walking;Standing;House hold activities;Lifting    Patient Stated Goals  Reports goal of not wanting to fall and improve her balance and safety with functional mobility     Currently in Pain?  No/denies         [facility] PT Assessment - 01/15/18 1159      Assessment   Medical Diagnosis  Repeated Falls     Referring Provider (PT)  Dr. Dorris Carnes    Onset Date/Surgical Date  12/24/17    Prior Therapy  Pt denies prior experiences with physical therapy       Precautions   Precautions  Fall      Restrictions   Weight Bearing Restrictions  No      Balance Screen   Has the patient fallen in the past 6 months  Yes    How many times?  2-3    Has the patient had a decrease  in activity level because of a fear of falling?   Yes    Is the patient reluctant to leave their home because of a fear of falling?   Yes   Fearful of falling     [place] to enter    Entrance Stairs-Number of Steps  1    Entrance Stairs-Rails  Right    Home Layout  Two level;Laundry or work area in basement;Able to live on main level with [employer] - 2 wheels;Grab bars - tub/shower      Prior Function   Level of Independence  Independent with household mobility with device;Needs assistance with ADLs   Pt reports using her RW for household ambulation.   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, and going out to eat 1x/wk       Cognition   Overall Cognitive Status  Within Functional Limits for tasks  assessed      Sensation   Light Touch  Appears Intact   grossly assessed via dermatomes     Coordination   Gross Motor Movements are Fluid and Coordinated  Yes    Heel Shin Test  WFL rt and lt      Posture/Postural Control   Posture/Postural Control  Postural limitations    Postural Limitations  Rounded Shoulders;Forward head;Increased thoracic kyphosis;Flexed trunk      ROM / Strength   AROM / PROM / Strength  AROM;Strength      AROM   Overall AROM   Within functional limits for tasks performed      Strength   Overall Strength  Deficits    Overall Strength Comments  Pt demonstrates LE strength deficits bilaterally. Pt demonstrates 4/5 strength when grossly assessed via MMT in sitting with exception to bilateral hip flexors, hamstrings, and DF's being 3/5.       Transfers   Transfers  Stand to Sit;Sit to Stand;Stand Pivot Transfers    Sit to Stand  5: Supervision    Five time sit to stand comments   Pt performs 5x STS from standard height chair in 24.5 seconds using UE's for assist and posterior thighs on back of chair for support. Time indicates patient is at increased risk for falls     Stand to Sit  5: Supervision    Stand Pivot Transfers  5: Supervision      Ambulation/Gait   Ambulation/Gait  Yes    Ambulation/Gait Assistance  5: Supervision    Ambulation Distance (Feet)  50 Feet    Assistive device  Rolling walker    Gait Pattern  Step-through pattern;Decreased stride length;Decreased hip/knee flexion - right;Decreased hip/knee flexion - left;Decreased dorsiflexion - right;Decreased dorsiflexion - left;Right foot flat;Left foot flat;Decreased trunk rotation;Trunk flexed;Narrow base of support    Ambulation Surface  Level;Indoor    Gait velocity  1.09 ft/sec with RW indicative of household level ambulator       Standardized Balance Assessment   Standardized Balance Assessment  Berg Balance Test      Berg Balance Test   Sit to Stand  Able to stand  independently using  hands    Standing Unsupported  Able to stand 2 minutes with supervision    Sitting with Back Unsupported but Feet Supported on Floor or  Stool  Able to sit safely and securely 2 minutes    Stand to Sit  Controls descent by using hands    Transfers  Able to transfer safely, definite need of hands    Standing Unsupported with Eyes Closed  Able to stand 10 seconds with supervision    Standing Ubsupported with Feet Together  Needs help to attain position but able to stand for 30 seconds with feet together    From Standing, Reach Forward with Outstretched Arm  Reaches forward but needs supervision    From Standing Position, Pick up Object from Floor  Able to pick up shoe, needs supervision    From Standing Position, Turn to Look Behind Over each Shoulder  Needs supervision when turning    Turn 360 Degrees  Needs close supervision or verbal cueing    Standing Unsupported, Alternately Place Feet on Step/Stool  Needs assistance to keep from falling or unable to try    Standing Unsupported, One Foot in Front  Needs help to step but can hold 15 seconds    Standing on One Leg  Unable to try or needs assist to prevent fall    Total Score  27    Berg comment:  27/56 indicating significant high fall risk                Objective measurements completed on examination: See above findings.              PT Education - 01/15/18 1329    Education Details  Therapist provided education regarding clinical findings of initial evaluation assessments, proper height of her RW for optimizing safety with ambulation, and POC moving forward.     Person(s) Educated  Patient;Child(ren)   daughter   Methods  Explanation    Comprehension  Verbalized understanding       PT Short Term Goals - 01/15/18 1331      PT SHORT TERM GOAL #1   Title  Pt will participate in the initiation of an HEP to improve strength, balance and safety with functional mobility     Time  3    Period  Weeks    Status  New     Target Date  02/05/18      PT SHORT TERM GOAL #2   Title  Pt will improve gait speed to >/= 1.31 ft/sec with her RW indicating improvement with functional mobility     Baseline  10/8: 1.09 ft/ sec    Time  3    Period  Weeks    Status  New    Target Date  02/05/18      PT SHORT TERM GOAL #3   Title  Pt will demonstrate improvement in Berg score to >/= 30/56 indicating reduction in fall risk potential     Baseline  10/8: 27/56 indicative of high fall risk potential     Time  3    Period  Weeks    Status  New    Target Date  02/05/18      PT SHORT TERM GOAL #4   Title  Pt will improve 5x STS to </= 17 seconds from standard height chair with UE assist and without using posterior thighs on chair for support indicating reduction in fall risk potential     Baseline  10/8: 24. 5 seconds indicating high fall risk potential     Time  3    Period  Weeks    Status  New  Target Date  02/05/18      PT SHORT TERM GOAL #5   Title  Pt will ambulate 300 feet outdoors using her RW navigating paved surfaces, curbs, and ramps with min A demonstrating improvement in community ambulation    Baseline  Pt reports she only ambulates household distances with her RW 2/2 to fear of falling    Time  3    Period  Weeks    Status  New    Target Date  02/05/18        PT Long Term Goals - 01/15/18 1336      PT LONG TERM GOAL #1   Title  Pt will report independence and compliancy with HEP exercises to improve strength, balance and functional mobility     Time  6    Period  Weeks    Status  New    Target Date  03/01/18      PT LONG TERM GOAL #2   Title  Pt will improve gait speed to >/= 1.8 ft/sec using her RW indicating significant improvement in functional mobility     Time  6    Period  Weeks    Status  New    Target Date  03/01/18      PT LONG TERM GOAL #3   Title  Pt will improve her Berg balance score to >/= 33/56 indicating reduction in fall risk potential     Time  6    Period   Weeks    Status  New    Target Date  03/01/18      PT LONG TERM GOAL #4   Title  Pt will improve 5x STS score to </=13.5 seconds from standard height chair with UE assist indicating reduction in fall risk potential and improvement in LE power     Time  6    Period  Weeks    Status  New    Target Date  03/01/18      PT LONG TERM GOAL #5   Title  Pt will ambulate 500 feet outdoors on paved surfaces including curbs and ramps with supervision and RW indicating an improvement in safety with community ambulation     Time  6    Period  Weeks    Status  New    Target Date  03/01/18      Additional Long Term Goals   Additional Long Term Goals  Yes      PT LONG TERM GOAL #6   Title  Pt will report experiencing no falls indicating improvement in safety with functional mobility     Baseline  2-3 falls in the past 6 month time period    Time  6    Period  Weeks    Status  New    Target Date  03/01/18             Plan - 01/15/18 1340    Clinical Impression Statement  Pt is a 84 year old female referred to Neuro OPPT for evaluation of functional mobility s/p falls. Pt's PMH is significant for the following: CAD, HTN, HL, CKD, Gout, and Transitional Cell Carcinoma. The following deficits were noted during pt's exam: Pt demonstrates lower extremity weakness bilaterally when grossly assessed in sitting via MMT.  Pt demonstrates intact sensation when grossly assessed and intact LE coordination bilaterally via heel to shin assessment. Pt's gait speed of 1.09 ft/sec using her RW is indicative of household ambulation and higher fall  risk at <1.81 ft/sec. Pt's Berg score of 27/56 indicates she is considered a high fall risk potential. Pt's 5x STS time of 24.5 seconds from standard height chair with upper extremity assist indicates she is considered a high fall risk.  Pt would benefit from skilled PT to address these impairments and functional limitations to maximize functional mobility independence  and reduce falls risk.     History and Personal Factors relevant to plan of care:  PMH-CAD, HTN, HL, CKD, Gout, Transitional Cell Carcinoma. Personal factors: Pt lives alone (with local supportive daughters), advanced age, coping style (fear of falling/anxious), potentially slower progress due to resistance to changing how she does things (ex. resistant to lowering her walker that is 4" too tall for her)    Clinical Presentation  Evolving    Clinical Presentation due to:  Increasing falls in recent months of unspecified cause    Clinical Decision Making  Moderate    Rehab Potential  Good    Clinical Impairments Affecting Rehab Potential  Repeated falls. Fear of falling causing her to avoid leaving her home and going out in the community    PT Frequency  1x / week    PT Duration  6 weeks    PT Treatment/Interventions  ADLs/Self Care Home Management;Stair training;Functional mobility training;Therapeutic activities;Patient/family education;Therapeutic exercise;Balance training;Neuromuscular re-education;DME Instruction;Gait training    PT Next Visit Plan  establish HEP (all pt is really interested in is being taught what she can do at home--?parts of [place]), high level balance, assess stair negotiation, strengthening exercises.     PT Home Exercise Plan  tbd     Consulted and Agree with Plan of Care  Family member/caregiver;Patient    Family Member Consulted  Daughter        Patient will benefit from skilled therapeutic intervention in order to improve the following deficits and impairments:  Abnormal gait, Decreased safety awareness, Impaired UE functional use, Impaired perceived functional ability, Decreased balance, Decreased knowledge of use of DME, Decreased mobility, Decreased strength, Postural dysfunction  Visit Diagnosis: Unsteadiness on feet - Plan: PT plan of care cert/re-cert  Other abnormalities of gait and mobility - Plan: PT plan of care cert/re-cert  Repeated falls - Plan: PT  plan of care cert/re-cert  Muscle weakness (generalized) - Plan: PT plan of care cert/re-cert  Abnormal posture - Plan: PT plan of care cert/re-cert     Problem List Patient Active Problem List   Diagnosis Date Noted  . Dysphagia 12/12/2017  . Pain in the chest   . Chest pain 11/03/2012  . Chronic kidney disease, stage III (moderate) ([place]) 11/03/2012  . Dizziness 04/28/2011  . Carotid arterial disease ([place]) 06/08/2009  . HLD (hyperlipidemia) 10/02/2008  . HYPERTENSION, BENIGN 10/02/2008  . CAD, NATIVE VESSEL 10/02/2008  . CLOSED FRACTURE OF LATERAL MALLEOLUS 01/06/2008    Floreen Comber, SPT 01/15/2018, 8:37 PM  [facility] [address] [place], [place], [address] Phone: [phone]   Fax:  [phone]  Name: Carly [name] MRN: [id number] Date of Birth: Mar 05, 1919

## 2018-01-22 ENCOUNTER — Ambulatory Visit: Payer: Medicare Other | Admitting: Rehabilitative and Restorative Service Providers"

## 2018-01-24 ENCOUNTER — Encounter: Payer: Self-pay | Admitting: Rehabilitative and Restorative Service Providers"

## 2018-01-24 ENCOUNTER — Ambulatory Visit: Payer: Medicare Other | Admitting: Rehabilitative and Restorative Service Providers"

## 2018-01-24 ENCOUNTER — Other Ambulatory Visit: Payer: Self-pay | Admitting: Internal Medicine

## 2018-01-24 DIAGNOSIS — R293 Abnormal posture: Secondary | ICD-10-CM | POA: Diagnosis not present

## 2018-01-24 DIAGNOSIS — R2689 Other abnormalities of gait and mobility: Secondary | ICD-10-CM | POA: Diagnosis not present

## 2018-01-24 DIAGNOSIS — M6281 Muscle weakness (generalized): Secondary | ICD-10-CM | POA: Diagnosis not present

## 2018-01-24 DIAGNOSIS — R296 Repeated falls: Secondary | ICD-10-CM

## 2018-01-24 DIAGNOSIS — R2681 Unsteadiness on feet: Secondary | ICD-10-CM | POA: Diagnosis not present

## 2018-01-24 NOTE — Patient Instructions (Signed)
Seated Shoulder Flexion Full Range   REPS: 10 SETS: 1 DAILY: 2 WEEKLY: 7

## 2018-01-24 NOTE — Therapy (Signed)
[facility] [address] [place], [place], [address] Phone: [phone]   Fax:  [phone]  Physical Therapy Treatment  Patient Details  Name: Carly [name] MRN: [id number] Date of Birth: [birth year] Referring Provider (PT): Dr. Dorris Carnes   Encounter Date: 01/24/2018  PT End of Session - 01/24/18 1329    Visit Number  2    Number of Visits  7    Date for PT Re-Evaluation  03/01/18    Authorization Type  Medicare Part A& B- * Requires 10th visit PN    PT Start Time  1340    PT Stop Time  1435    PT Time Calculation (min)  55 min    Equipment Utilized During Treatment  --    Activity Tolerance  Patient tolerated treatment well    Behavior During Therapy  Anxious       Past Medical History:  Diagnosis Date  . CAD (coronary artery disease)    a. s/p PTCA/stent to LAD and PTCA of ramus in 2003. b. normal nuc 10/2012 with EF 87%  . Carotid arterial disease ([place])    a. 07/2014 - duplex 35-46% RICA, 5-68% LICA.  Marland Kitchen CKD (chronic kidney disease), stage III ([place])   . Cystocele   . Dyslipidemia   . Gout   . HTN (hypertension)   . Transitional cell carcinoma ([place])    a. 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  . Vaginal wall prolapse     Past Surgical History:  Procedure Laterality Date  . APPENDECTOMY    . CORONARY ANGIOPLASTY WITH STENT PLACEMENT    . NEPHRECTOMY     right    There were no vitals filed for this visit.  Subjective Assessment - 01/24/18 1454    Subjective  The patient notes she thinks she should've done this years ago and she is not sure how much she will benefit from therapy.      Patient is accompained by:  Family member   daughter   Pertinent History  CAD, HTN, HL, CKD, Gout, Transitional Cell Carcinoma     Patient Stated Goals  Reports goal of not wanting to fall and improve her balance and safety with functional mobility     Currently in  Pain?  No/denies                       [facility] Adult PT Treatment/Exercise - 01/24/18 1410      Ambulation/Gait   Ambulation/Gait  Yes    Ambulation/Gait Assistance  5: Supervision    Ambulation Distance (Feet)  115 Feet   x2   Assistive device  Rolling walker    Gait Pattern  Step-through pattern;Decreased stride length;Decreased hip/knee flexion - right;Decreased hip/knee flexion - left;Decreased dorsiflexion - right;Decreased dorsiflexion - left;Right foot flat;Left foot flat;Decreased trunk rotation;Trunk flexed;Narrow base of support    Ambulation Surface  Level;Indoor      Self-Care   Self-Care  Other Self-Care Comments    Other Self-Care Comments   Discussed performing HEP with daughter's assistance.  Recommended 3 days/week near countertop for standing activities.      Exercises   Exercises  Other Exercises    Other Exercises   Patient also reports significant hand weakness.  PT had patient perform wrist flexion/extension x 10 reps, elbow flexion with peanut butter jar x 10 reps R, 4 reps L with help to hold wrist in neutral.  Seated alternating reaching overhead.          Balance Exercises - 01/24/18 1451      OT[place]O PROGRAM   Head Movements  Standing;5 reps    Trunk Movements  Standing;5 reps    Ankle Movements  10 reps;Sitting    Knee Extensor  10 reps    Knee Flexor  10 reps    Hip ABductor  5 reps    Ankle Plantorflexors  --   could tolerate 5 reps   Ankle Dorsiflexors  --   could tolerate 5 reps   Knee Bends  10 reps, support    Backwards Walking  Support    Sideways Walking  Assistive device   using countertop       PT Education - 01/24/18 1455    Education Details  [place] HEP and UE reaching    Person(s) Educated  Patient    Methods  Explanation;Demonstration;Handout    Comprehension  Verbalized understanding;Returned demonstration       PT Short Term Goals - 01/15/18 1331      PT SHORT TERM GOAL #1   Title  Pt will participate in  the initiation of an HEP to improve strength, balance and safety with functional mobility     Time  3    Period  Weeks    Status  New    Target Date  02/05/18      PT SHORT TERM GOAL #2   Title  Pt will improve gait speed to >/= 1.31 ft/sec with her RW indicating improvement with functional mobility     Baseline  10/8: 1.09 ft/ sec    Time  3    Period  Weeks    Status  New    Target Date  02/05/18      PT SHORT TERM GOAL #3   Title  Pt will demonstrate improvement in Berg score to >/= 30/56 indicating reduction in fall risk potential     Baseline  10/8: 27/56 indicative of high fall risk potential     Time  3    Period  Weeks    Status  New    Target Date  02/05/18      PT SHORT TERM GOAL #4   Title  Pt will improve 5x STS to </= 17 seconds from standard height chair with UE assist and without using posterior thighs on chair for support indicating reduction in fall risk potential     Baseline  10/8: 24. 5 seconds indicating high fall risk potential     Time  3    Period  Weeks    Status  New    Target Date  02/05/18      PT SHORT TERM GOAL #5   Title  Pt will ambulate 300 feet outdoors using her RW navigating paved surfaces, curbs, and ramps with min A demonstrating improvement in community ambulation    Baseline  Pt reports she only ambulates household distances with her RW 2/2 to fear of falling    Time  3    Period  Weeks    Status  New    Target Date  02/05/18        PT Long Term Goals - 01/15/18 1336      PT LONG TERM GOAL #1   Title  Pt will report independence and compliancy with HEP exercises to improve strength, balance and functional mobility     Time  6    Period  Weeks  Status  New    Target Date  03/01/18      PT LONG TERM GOAL #2   Title  Pt will improve gait speed to >/= 1.8 ft/sec using her RW indicating significant improvement in functional mobility     Time  6    Period  Weeks    Status  New    Target Date  03/01/18      PT LONG TERM GOAL  #3   Title  Pt will improve her Berg balance score to >/= 33/56 indicating reduction in fall risk potential     Time  6    Period  Weeks    Status  New    Target Date  03/01/18      PT LONG TERM GOAL #4   Title  Pt will improve 5x STS score to </=13.5 seconds from standard height chair with UE assist indicating reduction in fall risk potential and improvement in LE power     Time  6    Period  Weeks    Status  New    Target Date  03/01/18      PT LONG TERM GOAL #5   Title  Pt will ambulate 500 feet outdoors on paved surfaces including curbs and ramps with supervision and RW indicating an improvement in safety with community ambulation     Time  6    Period  Weeks    Status  New    Target Date  03/01/18      Additional Long Term Goals   Additional Long Term Goals  Yes      PT LONG TERM GOAL #6   Title  Pt will report experiencing no falls indicating improvement in safety with functional mobility     Baseline  2-3 falls in the past 6 month time period    Time  6    Period  Weeks    Status  New    Target Date  03/01/18            Plan - 01/24/18 1456    Clinical Impression Statement  The patient tolerated home program well with PT recommending assist during HEP for safety.  The patient also has UE weakness L more pronounced than R.  PT to continue working ot [employer].     PT Treatment/Interventions  ADLs/Self Care Home Management;Stair training;Functional mobility training;Therapeutic activities;Patient/family education;Therapeutic exercise;Balance training;Neuromuscular re-education;DME Instruction;Gait training    PT Next Visit Plan  Check [place], add HEP for UEs, posture, hands for general function/strengthening.  High level balance, assess stair negotiation, strengthening activities.    Consulted and Agree with Plan of Care  Patient;Family member/caregiver    Family Member Consulted  Daughter        Patient will benefit from skilled therapeutic intervention in order to  improve the following deficits and impairments:  Abnormal gait, Decreased safety awareness, Impaired UE functional use, Impaired perceived functional ability, Decreased balance, Decreased knowledge of use of DME, Decreased mobility, Decreased strength, Postural dysfunction  Visit Diagnosis: Unsteadiness on feet  Other abnormalities of gait and mobility  Repeated falls  Muscle weakness (generalized)  Abnormal posture     Problem List Patient Active Problem List   Diagnosis Date Noted  . Dysphagia 12/12/2017  . Pain in the chest   . Chest pain 11/03/2012  . Chronic kidney disease, stage III (moderate) ([place]) 11/03/2012  . Dizziness 04/28/2011  . Carotid arterial disease ([place]) 06/08/2009  . HLD (hyperlipidemia) 10/02/2008  .  HYPERTENSION, BENIGN 10/02/2008  . CAD, NATIVE VESSEL 10/02/2008  . CLOSED FRACTURE OF LATERAL MALLEOLUS 01/06/2008    Sharonda Llamas, PT 01/24/2018, 3:00 PM  [facility] [address] [place], [place], [address] Phone: [phone]   Fax:  [phone]  Name: ELECIA SERAFIN MRN: [id number] Date of Birth: [birth year]

## 2018-01-29 ENCOUNTER — Encounter: Payer: Self-pay | Admitting: Rehabilitative and Restorative Service Providers"

## 2018-01-29 ENCOUNTER — Ambulatory Visit: Payer: Medicare Other | Admitting: Rehabilitative and Restorative Service Providers"

## 2018-01-29 DIAGNOSIS — R296 Repeated falls: Secondary | ICD-10-CM

## 2018-01-29 DIAGNOSIS — R2681 Unsteadiness on feet: Secondary | ICD-10-CM

## 2018-01-29 DIAGNOSIS — R2689 Other abnormalities of gait and mobility: Secondary | ICD-10-CM | POA: Diagnosis not present

## 2018-01-29 DIAGNOSIS — M6281 Muscle weakness (generalized): Secondary | ICD-10-CM | POA: Diagnosis not present

## 2018-01-29 DIAGNOSIS — R293 Abnormal posture: Secondary | ICD-10-CM | POA: Diagnosis not present

## 2018-01-29 NOTE — Therapy (Signed)
[facility] [address] [place], [place], [address] Phone: [phone]   Fax:  [phone]  Physical Therapy Treatment  Patient Details  Name: Carly [name] MRN: [id number] Date of Birth: Jun 13, 1918 Referring Provider (PT): Dr. Dorris Carnes   Encounter Date: 01/29/2018  PT End of Session - 01/29/18 2152    Visit Number  3    Number of Visits  7    Date for PT Re-Evaluation  03/01/18    Authorization Type  Medicare Part A& B- * Requires 10th visit PN    PT Start Time  1106    PT Stop Time  1150    PT Time Calculation (min)  44 min    Activity Tolerance  Patient tolerated treatment well    Behavior During Therapy  Anxious       Past Medical History:  Diagnosis Date  . CAD (coronary artery disease)    a. s/p PTCA/stent to LAD and PTCA of ramus in 2003. b. normal nuc 10/2012 with EF 87%  . Carotid arterial disease ([place])    a. 07/2014 - duplex 69-62% RICA, 9-52% LICA.  Marland Kitchen CKD (chronic kidney disease), stage III ([place])   . Cystocele   . Dyslipidemia   . Gout   . HTN (hypertension)   . Transitional cell carcinoma ([place])    a. 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  . Vaginal wall prolapse     Past Surgical History:  Procedure Laterality Date  . APPENDECTOMY    . CORONARY ANGIOPLASTY WITH STENT PLACEMENT    . NEPHRECTOMY     right    There were no vitals filed for this visit.  Subjective Assessment - 01/29/18 1113    Subjective  The patient reports that she did exercises with her daughter twice since last session.  She notes heel raises were uncomfortable to her knees.    Pertinent History  CAD, HTN, HL, CKD, Gout, Transitional Cell Carcinoma     Patient Stated Goals  Reports goal of not wanting to fall and improve her balance and safety with functional mobility     Currently in Pain?  No/denies                       [facility] Adult PT  Treatment/Exercise - 01/29/18 2156      Ambulation/Gait   Ambulation/Gait  Yes    Ambulation/Gait Assistance  5: Supervision    Ambulation/Gait Assistance Details  Cues for longer stride length and heel strike    Ambulation Distance (Feet)  230 Feet    Assistive device  Rolling walker    Gait Pattern  Step-through pattern;Decreased stride length;Decreased dorsiflexion - right;Decreased dorsiflexion - left;Right foot flat;Left foot flat;Decreased trunk rotation;Trunk flexed;Narrow base of support    Ambulation Surface  Level;Indoor      Neuro Re-ed    Neuro Re-ed Details   Standing near countertop working on ant/posterior weight shifting with reaching overhead.  Standing on compliant surfaces alternating UE reaching with CGA.        Exercises   Exercises  Other Exercises;Wrist    Other Exercises   Modified HEP from standing heel raises to seated heel raises.      Wrist Exercises   Wrist Flexion  AROM;Strengthening;Left    Wrist Flexion Limitations  attempted with yellow theraband, however too much resistance.  Worked on table support with flexion x 10 reps.    Wrist  Extension  AROM;Strengthening;Left    Wrist Extension Limitations  yellow theraband initially tried, however used anti=gravity resistance.             PT Education - 01/29/18 1251    Education Details  modified heel raises from standing>sitting; added home exercise for wrist flexion/extension.    Person(s) Educated  Patient    Methods  Explanation;Demonstration;Handout    Comprehension  Verbalized understanding;Returned demonstration       PT Short Term Goals - 01/15/18 1331      PT SHORT TERM GOAL #1   Title  Pt will participate in the initiation of an HEP to improve strength, balance and safety with functional mobility     Time  3    Period  Weeks    Status  New    Target Date  02/05/18      PT SHORT TERM GOAL #2   Title  Pt will improve gait speed to >/= 1.31 ft/sec with her RW indicating improvement  with functional mobility     Baseline  10/8: 1.09 ft/ sec    Time  3    Period  Weeks    Status  New    Target Date  02/05/18      PT SHORT TERM GOAL #3   Title  Pt will demonstrate improvement in Berg score to >/= 30/56 indicating reduction in fall risk potential     Baseline  10/8: 27/56 indicative of high fall risk potential     Time  3    Period  Weeks    Status  New    Target Date  02/05/18      PT SHORT TERM GOAL #4   Title  Pt will improve 5x STS to </= 17 seconds from standard height chair with UE assist and without using posterior thighs on chair for support indicating reduction in fall risk potential     Baseline  10/8: 24. 5 seconds indicating high fall risk potential     Time  3    Period  Weeks    Status  New    Target Date  02/05/18      PT SHORT TERM GOAL #5   Title  Pt will ambulate 300 feet outdoors using her RW navigating paved surfaces, curbs, and ramps with min A demonstrating improvement in community ambulation    Baseline  Pt reports she only ambulates household distances with her RW 2/2 to fear of falling    Time  3    Period  Weeks    Status  New    Target Date  02/05/18        PT Long Term Goals - 01/15/18 1336      PT LONG TERM GOAL #1   Title  Pt will report independence and compliancy with HEP exercises to improve strength, balance and functional mobility     Time  6    Period  Weeks    Status  New    Target Date  03/01/18      PT LONG TERM GOAL #2   Title  Pt will improve gait speed to >/= 1.8 ft/sec using her RW indicating significant improvement in functional mobility     Time  6    Period  Weeks    Status  New    Target Date  03/01/18      PT LONG TERM GOAL #3   Title  Pt will improve her Berg balance score to >/=  33/56 indicating reduction in fall risk potential     Time  6    Period  Weeks    Status  New    Target Date  03/01/18      PT LONG TERM GOAL #4   Title  Pt will improve 5x STS score to </=13.5 seconds from standard  height chair with UE assist indicating reduction in fall risk potential and improvement in LE power     Time  6    Period  Weeks    Status  New    Target Date  03/01/18      PT LONG TERM GOAL #5   Title  Pt will ambulate 500 feet outdoors on paved surfaces including curbs and ramps with supervision and RW indicating an improvement in safety with community ambulation     Time  6    Period  Weeks    Status  New    Target Date  03/01/18      Additional Long Term Goals   Additional Long Term Goals  Yes      PT LONG TERM GOAL #6   Title  Pt will report experiencing no falls indicating improvement in safety with functional mobility     Baseline  2-3 falls in the past 6 month time period    Time  6    Period  Weeks    Status  New    Target Date  03/01/18            Plan - 01/29/18 2154    Clinical Impression Statement  PT modified heel raises for home and added further activities for general strengthening (patient c/o wrist weakness, which does influence ability to push walker).  PT to continue to develop [place] home program and work to [employer].     PT Treatment/Interventions  ADLs/Self Care Home Management;Stair training;Functional mobility training;Therapeutic activities;Patient/family education;Therapeutic exercise;Balance training;Neuromuscular re-education;DME Instruction;Gait training    PT Next Visit Plan  Check [place], add HEP for UEs, posture, hands for general function/strengthening.  High level balance, assess stair negotiation, strengthening activities.    Consulted and Agree with Plan of Care  Patient;Family member/caregiver    Family Member Consulted  Daughter        Patient will benefit from skilled therapeutic intervention in order to improve the following deficits and impairments:  Abnormal gait, Decreased safety awareness, Impaired UE functional use, Impaired perceived functional ability, Decreased balance, Decreased knowledge of use of DME, Decreased mobility, Decreased  strength, Postural dysfunction  Visit Diagnosis: Unsteadiness on feet  Other abnormalities of gait and mobility  Repeated falls     Problem List Patient Active Problem List   Diagnosis Date Noted  . Dysphagia 12/12/2017  . Pain in the chest   . Chest pain 11/03/2012  . Chronic kidney disease, stage III (moderate) ([place]) 11/03/2012  . Dizziness 04/28/2011  . Carotid arterial disease ([place]) 06/08/2009  . HLD (hyperlipidemia) 10/02/2008  . HYPERTENSION, BENIGN 10/02/2008  . CAD, NATIVE VESSEL 10/02/2008  . CLOSED FRACTURE OF LATERAL MALLEOLUS 01/06/2008    Inice Sanluis, PT 01/29/2018, 10:05 PM  [facility] [address] [place], [place], [address] Phone: [phone]   Fax:  [phone]  Name: ROZELLE CAUDLE MRN: [id number] Date of Birth: August 02, 1918

## 2018-01-29 NOTE — Patient Instructions (Signed)
Wrist Extension: (Eccentric) - Elbow Extended    Arm on table, bend wrist, lowering hand. Then turn forearm over, keeping wrist bent, hand up. Slowly lower hand for 3-5 seconds. _10__ reps per set, _1__ sets per day.   http://ecce.exer.us/267   Copyright  VHI. All rights reserved.  Wrist Flexion: (Eccentric) - Elbow Extended    Arm on table, bend wrist, lowering hand. Then turn forearm over, keeping wrist bent, hand up. Slowly lower hand for 3-5 seconds. _10__ reps per set, 1 set per day.  http://ecce.exer.us/271   Copyright  VHI. All rights reserved.   Heel Raise (Sitting)    Raise heels, keeping toes on floor. Repeat _10___ times per set. Do _1___ sets per session.  http://orth.exer.us/45   Copyright  VHI. All rights reserved.

## 2018-02-05 ENCOUNTER — Ambulatory Visit: Payer: Medicare Other | Admitting: Rehabilitative and Restorative Service Providers"

## 2018-02-05 DIAGNOSIS — R809 Proteinuria, unspecified: Secondary | ICD-10-CM | POA: Diagnosis not present

## 2018-02-05 DIAGNOSIS — Z0000 Encounter for general adult medical examination without abnormal findings: Secondary | ICD-10-CM | POA: Diagnosis not present

## 2018-02-05 DIAGNOSIS — M810 Age-related osteoporosis without current pathological fracture: Secondary | ICD-10-CM | POA: Diagnosis not present

## 2018-02-05 DIAGNOSIS — R1310 Dysphagia, unspecified: Secondary | ICD-10-CM | POA: Diagnosis not present

## 2018-02-05 DIAGNOSIS — R2240 Localized swelling, mass and lump, unspecified lower limb: Secondary | ICD-10-CM | POA: Diagnosis not present

## 2018-02-05 DIAGNOSIS — M109 Gout, unspecified: Secondary | ICD-10-CM | POA: Diagnosis not present

## 2018-02-05 DIAGNOSIS — R944 Abnormal results of kidney function studies: Secondary | ICD-10-CM | POA: Diagnosis not present

## 2018-02-05 DIAGNOSIS — S81801A Unspecified open wound, right lower leg, initial encounter: Secondary | ICD-10-CM | POA: Diagnosis not present

## 2018-02-05 DIAGNOSIS — E782 Mixed hyperlipidemia: Secondary | ICD-10-CM | POA: Diagnosis not present

## 2018-02-05 DIAGNOSIS — D509 Iron deficiency anemia, unspecified: Secondary | ICD-10-CM | POA: Diagnosis not present

## 2018-02-05 DIAGNOSIS — Z6821 Body mass index (BMI) 21.0-21.9, adult: Secondary | ICD-10-CM | POA: Diagnosis not present

## 2018-02-05 DIAGNOSIS — S51801A Unspecified open wound of right forearm, initial encounter: Secondary | ICD-10-CM | POA: Diagnosis not present

## 2018-02-05 DIAGNOSIS — I10 Essential (primary) hypertension: Secondary | ICD-10-CM | POA: Diagnosis not present

## 2018-02-05 DIAGNOSIS — R296 Repeated falls: Secondary | ICD-10-CM | POA: Diagnosis not present

## 2018-02-06 ENCOUNTER — Ambulatory Visit: Payer: Medicare Other | Admitting: Rehabilitative and Restorative Service Providers"

## 2018-02-06 ENCOUNTER — Encounter: Payer: Self-pay | Admitting: Rehabilitative and Restorative Service Providers"

## 2018-02-06 DIAGNOSIS — R2681 Unsteadiness on feet: Secondary | ICD-10-CM

## 2018-02-06 DIAGNOSIS — M6281 Muscle weakness (generalized): Secondary | ICD-10-CM

## 2018-02-06 DIAGNOSIS — R293 Abnormal posture: Secondary | ICD-10-CM

## 2018-02-06 DIAGNOSIS — R296 Repeated falls: Secondary | ICD-10-CM | POA: Diagnosis not present

## 2018-02-06 DIAGNOSIS — R2689 Other abnormalities of gait and mobility: Secondary | ICD-10-CM

## 2018-02-06 NOTE — Therapy (Signed)
[facility] [address] [place], [place], [address] Phone: [phone]   Fax:  [phone]  Physical Therapy Treatment  Patient Details  Name: Carly [name] MRN: [id number] Date of Birth: 08-15-1918 Referring Provider (PT): Dr. Dorris Carnes   Encounter Date: 02/06/2018  PT End of Session - 02/06/18 1109    Visit Number  4    Number of Visits  7    Date for PT Re-Evaluation  03/01/18    Authorization Type  Medicare Part A& B- * Requires 10th visit PN    PT Start Time  1105    PT Stop Time  1145    PT Time Calculation (min)  40 min    Activity Tolerance  Patient tolerated treatment well    Behavior During Therapy  Anxious       Past Medical History:  Diagnosis Date  . CAD (coronary artery disease)    a. s/p PTCA/stent to LAD and PTCA of ramus in 2003. b. normal nuc 10/2012 with EF 87%  . Carotid arterial disease ([place])    a. 07/2014 - duplex 44-01% RICA, 0-27% LICA.  Marland Kitchen CKD (chronic kidney disease), stage III ([place])   . Cystocele   . Dyslipidemia   . Gout   . HTN (hypertension)   . Transitional cell carcinoma ([place])    a. 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  . Vaginal wall prolapse     Past Surgical History:  Procedure Laterality Date  . APPENDECTOMY    . CORONARY ANGIOPLASTY WITH STENT PLACEMENT    . NEPHRECTOMY     right    There were no vitals filed for this visit.  Subjective Assessment - 02/06/18 1108    Subjective  The patient notes that she was tired after therapy last visit.     Patient is accompained by:  Family member    Pertinent History  CAD, HTN, HL, CKD, Gout, Transitional Cell Carcinoma     Patient Stated Goals  Reports goal of not wanting to fall and improve her balance and safety with functional mobility     Currently in Pain?  No/denies                       [facility] Adult PT Treatment/Exercise - 02/06/18 2108      Ambulation/Gait   Ambulation/Gait  Yes    Ambulation/Gait Assistance  5: Supervision    Ambulation/Gait Assistance Details  cues for upright posture and longer stride length    Ambulation Distance (Feet)  230 Feet   x 2   Assistive device  Rolling walker    Ambulation Surface  Level;Indoor    Stairs  Yes    Stairs Assistance  4: Min guard    Stair Management Technique  Step to pattern;Two rails    Number of Stairs  8      Neuro Re-ed    Neuro Re-ed Details   Standing in // bars performing lateral stepping and return to midline with encouragement for patient to be willing to reduce UE support on bars.  Performed foam standing working on UE alternating movements, reaching, head motion and eyes closed with min A.  Performed rocker board standing with min A and reaching, head motion and eyes closed.  Attempted alternating LE tapping to 6" cones with m in A and UE support.  Patient reluctant to dec UE support.  Moved to 6" step dec'ing UE  support during alternating tapping activities.        Exercises   Exercises  Other Exercises    Other Exercises   Seated UE strengthening including reaching overhead, bicep curls with 1 lb weight, wrist flexion/extension with 1 lb weight with assist to hold weight in L hand.                 PT Short Term Goals - 01/15/18 1331      PT SHORT TERM GOAL #1   Title  Pt will participate in the initiation of an HEP to improve strength, balance and safety with functional mobility     Time  3    Period  Weeks    Status  New    Target Date  02/05/18      PT SHORT TERM GOAL #2   Title  Pt will improve gait speed to >/= 1.31 ft/sec with her RW indicating improvement with functional mobility     Baseline  10/8: 1.09 ft/ sec    Time  3    Period  Weeks    Status  New    Target Date  02/05/18      PT SHORT TERM GOAL #3   Title  Pt will demonstrate improvement in Berg score to >/= 30/56 indicating reduction in fall risk potential     Baseline  10/8:  27/56 indicative of high fall risk potential     Time  3    Period  Weeks    Status  New    Target Date  02/05/18      PT SHORT TERM GOAL #4   Title  Pt will improve 5x STS to </= 17 seconds from standard height chair with UE assist and without using posterior thighs on chair for support indicating reduction in fall risk potential     Baseline  10/8: 24. 5 seconds indicating high fall risk potential     Time  3    Period  Weeks    Status  New    Target Date  02/05/18      PT SHORT TERM GOAL #5   Title  Pt will ambulate 300 feet outdoors using her RW navigating paved surfaces, curbs, and ramps with min A demonstrating improvement in community ambulation    Baseline  Pt reports she only ambulates household distances with her RW 2/2 to fear of falling    Time  3    Period  Weeks    Status  New    Target Date  02/05/18        PT Long Term Goals - 01/15/18 1336      PT LONG TERM GOAL #1   Title  Pt will report independence and compliancy with HEP exercises to improve strength, balance and functional mobility     Time  6    Period  Weeks    Status  New    Target Date  03/01/18      PT LONG TERM GOAL #2   Title  Pt will improve gait speed to >/= 1.8 ft/sec using her RW indicating significant improvement in functional mobility     Time  6    Period  Weeks    Status  New    Target Date  03/01/18      PT LONG TERM GOAL #3   Title  Pt will improve her Berg balance score to >/= 33/56 indicating reduction in fall risk potential  Time  6    Period  Weeks    Status  New    Target Date  03/01/18      PT LONG TERM GOAL #4   Title  Pt will improve 5x STS score to </=13.5 seconds from standard height chair with UE assist indicating reduction in fall risk potential and improvement in LE power     Time  6    Period  Weeks    Status  New    Target Date  03/01/18      PT LONG TERM GOAL #5   Title  Pt will ambulate 500 feet outdoors on paved surfaces including curbs and ramps with  supervision and RW indicating an improvement in safety with community ambulation     Time  6    Period  Weeks    Status  New    Target Date  03/01/18      Additional Long Term Goals   Additional Long Term Goals  Yes      PT LONG TERM GOAL #6   Title  Pt will report experiencing no falls indicating improvement in safety with functional mobility     Baseline  2-3 falls in the past 6 month time period    Time  6    Period  Weeks    Status  New    Target Date  03/01/18            Plan - 02/06/18 2110    Clinical Impression Statement  The patient is continuing to perform HEP with daughter assist.  She continues iwth fear of falling limiting participation in more challenging balance activities in the clinic.  PT to progress as patient tolerates and consider d/c with [place] HEP.     PT Treatment/Interventions  ADLs/Self Care Home Management;Stair training;Functional mobility training;Therapeutic activities;Patient/family education;Therapeutic exercise;Balance training;Neuromuscular re-education;DME Instruction;Gait training    PT Next Visit Plan  Check [place], add HEP for UEs, posture, hands for general function/strengthening.  D/C PLANNING WITH daughter and patient.    Consulted and Agree with Plan of Care  Patient;Family member/caregiver    Family Member Consulted  Daughter        Patient will benefit from skilled therapeutic intervention in order to improve the following deficits and impairments:  Abnormal gait, Decreased safety awareness, Impaired UE functional use, Impaired perceived functional ability, Decreased balance, Decreased knowledge of use of DME, Decreased mobility, Decreased strength, Postural dysfunction  Visit Diagnosis: Unsteadiness on feet  Other abnormalities of gait and mobility  Repeated falls  Muscle weakness (generalized)  Abnormal posture     Problem List Patient Active Problem List   Diagnosis Date Noted  . Dysphagia 12/12/2017  . Pain in the  chest   . Chest pain 11/03/2012  . Chronic kidney disease, stage III (moderate) ([place]) 11/03/2012  . Dizziness 04/28/2011  . Carotid arterial disease ([place]) 06/08/2009  . HLD (hyperlipidemia) 10/02/2008  . HYPERTENSION, BENIGN 10/02/2008  . CAD, NATIVE VESSEL 10/02/2008  . CLOSED FRACTURE OF LATERAL MALLEOLUS 01/06/2008    Zakyla Tonche, PT 02/06/2018, 9:12 PM  [facility] [address] [place], [place], [address] Phone: [phone]   Fax:  [phone]  Name: Carly [name] MRN: [id number] Date of Birth: 1919/02/12

## 2018-02-07 ENCOUNTER — Ambulatory Visit: Payer: Medicare Other | Admitting: Rehabilitative and Restorative Service Providers"

## 2018-02-14 ENCOUNTER — Encounter: Payer: Self-pay | Admitting: Rehabilitative and Restorative Service Providers"

## 2018-02-14 ENCOUNTER — Ambulatory Visit: Payer: Medicare Other | Attending: Internal Medicine | Admitting: Rehabilitative and Restorative Service Providers"

## 2018-02-14 DIAGNOSIS — R2681 Unsteadiness on feet: Secondary | ICD-10-CM | POA: Insufficient documentation

## 2018-02-14 DIAGNOSIS — R293 Abnormal posture: Secondary | ICD-10-CM | POA: Diagnosis not present

## 2018-02-14 DIAGNOSIS — R296 Repeated falls: Secondary | ICD-10-CM

## 2018-02-14 DIAGNOSIS — M6281 Muscle weakness (generalized): Secondary | ICD-10-CM | POA: Diagnosis not present

## 2018-02-14 DIAGNOSIS — R2689 Other abnormalities of gait and mobility: Secondary | ICD-10-CM | POA: Insufficient documentation

## 2018-02-14 NOTE — Therapy (Signed)
[facility] [address] [place], [place], [address] Phone: [phone]   Fax:  [phone]  Physical Therapy Treatment  Patient Details  Name: Carly [name] MRN: [id number] Date of Birth: May 28, 1918 Referring Provider (PT): Dr. Dorris Carnes   Encounter Date: 02/14/2018  PT End of Session - 02/14/18 1205    Visit Number  5    Number of Visits  7    Date for PT Re-Evaluation  03/01/18    Authorization Type  Medicare Part A& B- * Requires 10th visit PN    PT Start Time  1108    PT Stop Time  1148    PT Time Calculation (min)  40 min    Equipment Utilized During Treatment  Gait belt    Activity Tolerance  Patient tolerated treatment well    Behavior During Therapy  Anxious       Past Medical History:  Diagnosis Date  . CAD (coronary artery disease)    a. s/p PTCA/stent to LAD and PTCA of ramus in 2003. b. normal nuc 10/2012 with EF 87%  . Carotid arterial disease ([place])    a. 07/2014 - duplex 63-78% RICA, 5-88% LICA.  Marland Kitchen CKD (chronic kidney disease), stage III ([place])   . Cystocele   . Dyslipidemia   . Gout   . HTN (hypertension)   . Transitional cell carcinoma ([place])    a. 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  . Vaginal wall prolapse     Past Surgical History:  Procedure Laterality Date  . APPENDECTOMY    . CORONARY ANGIOPLASTY WITH STENT PLACEMENT    . NEPHRECTOMY     right    There were no vitals filed for this visit.  Subjective Assessment - 02/14/18 1109    Subjective  The patient notes she is doing HEP with her daughter.  She cannot tell a difference in her balance.    Patient is accompained by:  Family member    Pertinent History  CAD, HTN, HL, CKD, Gout, Transitional Cell Carcinoma     Patient Stated Goals  Reports goal of not wanting to fall and improve her balance and safety with functional mobility     Currently in Pain?  No/denies          [facility] PT Assessment - 02/14/18 1116      Transfers   Five time sit to stand comments   20.34 seconds with UEs support on chair base      Ambulation/Gait   Ambulation/Gait Assistance Details  Cues for upright posture       Berg Balance Test   Sit to Stand  Able to stand  independently using hands    Standing Unsupported  Able to stand safely 2 minutes    Sitting with Back Unsupported but Feet Supported on Floor or Stool  Able to sit safely and securely 2 minutes    Stand to Sit  Sits safely with minimal use of hands    Transfers  Able to transfer safely, definite need of hands    Standing Unsupported with Eyes Closed  Able to stand 10 seconds safely    Standing Ubsupported with Feet Together  Able to place feet together independently and stand for 1 minute with supervision    From Standing, Reach Forward with Outstretched Arm  Can reach forward >5 cm safely (2")    From Standing Position, Pick up Object from [place] to  pick up shoe, needs supervision    From Standing Position, Turn to Look Behind Over each Shoulder  Turn sideways only but maintains balance    Turn 360 Degrees  Able to turn 360 degrees safely but slowly    Standing Unsupported, Alternately Place Feet on Step/Stool  Able to complete >2 steps/needs minimal assist    Standing Unsupported, One Foot in Front  Able to take small step independently and hold 30 seconds    Standing on One Leg  Tries to lift leg/unable to hold 3 seconds but remains standing independently    Total Score  38    Berg comment:  38/56 (initial eval 27/56)                   [facility] Adult PT Treatment/Exercise - 02/14/18 1116      Ambulation/Gait   Ambulation/Gait  Yes    Ambulation/Gait Assistance  5: Supervision    Ambulation Distance (Feet)  300 Feet    Assistive device  Rolling walker    Ambulation Surface  Level;Indoor    Gait velocity  2.01 ft/sec      Therapeutic Activites    Therapeutic Activities  Other Therapeutic  Activities    Other Therapeutic Activities  The patient's daughter noted that navigating the kitchen is challenging for thepatient.  She demonstrated scooting the walker across the kitchen with one hand and carrying milk with the other.  PT recommended the patient get a walker basket to place milk/orange juice containers in.  We demonstrated and performed opening the refrigerator with the walker in a position that she can easily step closer towards the refrigerator without dragging it with one hand.                 PT Short Term Goals - 02/14/18 1127      PT SHORT TERM GOAL #1   Title  Pt will participate in the initiation of an HEP to improve strength, balance and safety with functional mobility     Baseline  The patient is doing HEP with her daughter.      Time  3    Period  Weeks    Status  Achieved      PT SHORT TERM GOAL #2   Title  Pt will improve gait speed to >/= 1.31 ft/sec with her RW indicating improvement with functional mobility     Baseline  10/8: 1.09 ft/ sec/ 2.01 ft/sec on 02/14/18.    Time  3    Period  Weeks    Status  Achieved      PT SHORT TERM GOAL #3   Title  Pt will demonstrate improvement in Berg score to >/= 30/56 indicating reduction in fall risk potential     Baseline  38/56.    Time  3    Period  Weeks    Status  Achieved      PT SHORT TERM GOAL #4   Title  Pt will improve 5x STS to </= 17 seconds from standard height chair with UE assist and without using posterior thighs on chair for support indicating reduction in fall risk potential     Baseline  10/8: 24. 5 seconds indicating high fall risk potential  (20.34 seconds on 02/14/2018)    Time  3    Period  Weeks    Status  Partially Met      PT SHORT TERM GOAL #5   Title  Pt will ambulate 300 feet outdoors  using her RW navigating paved surfaces, curbs, and ramps with min A demonstrating improvement in community ambulation    Baseline  Pt reports she only ambulates household distances with her  RW 2/2 to fear of falling    Time  3    Period  Weeks    Status  Deferred        PT Long Term Goals - 01/15/18 1336      PT LONG TERM GOAL #1   Title  Pt will report independence and compliancy with HEP exercises to improve strength, balance and functional mobility     Time  6    Period  Weeks    Status  New    Target Date  03/01/18      PT LONG TERM GOAL #2   Title  Pt will improve gait speed to >/= 1.8 ft/sec using her RW indicating significant improvement in functional mobility     Time  6    Period  Weeks    Status  New    Target Date  03/01/18      PT LONG TERM GOAL #3   Title  Pt will improve her Berg balance score to >/= 33/56 indicating reduction in fall risk potential     Time  6    Period  Weeks    Status  New    Target Date  03/01/18      PT LONG TERM GOAL #4   Title  Pt will improve 5x STS score to </=13.5 seconds from standard height chair with UE assist indicating reduction in fall risk potential and improvement in LE power     Time  6    Period  Weeks    Status  New    Target Date  03/01/18      PT LONG TERM GOAL #5   Title  Pt will ambulate 500 feet outdoors on paved surfaces including curbs and ramps with supervision and RW indicating an improvement in safety with community ambulation     Time  6    Period  Weeks    Status  New    Target Date  03/01/18      Additional Long Term Goals   Additional Long Term Goals  Yes      PT LONG TERM GOAL #6   Title  Pt will report experiencing no falls indicating improvement in safety with functional mobility     Baseline  2-3 falls in the past 6 month time period    Time  6    Period  Weeks    Status  New    Target Date  03/01/18            Plan - 02/14/18 1205    Clinical Impression Statement  The patient partially met STGs.  She is improving with Berg and gait speed.  The patient is compliant with daughter's assistance with [facility].     PT Treatment/Interventions  ADLs/Self Care Home  Management;Stair training;Functional mobility training;Therapeutic activities;Patient/family education;Therapeutic exercise;Balance training;Neuromuscular re-education;DME Instruction;Gait training    PT Next Visit Plan  Add to [place] as needed,, posture, hands for general function/strengthening.  Work on Engineer, materials at [employer].  D/C PLANNING WITH daughter and patient.    Consulted and Agree with Plan of Care  Patient;Family member/caregiver    Family Member Consulted  Daughter        Patient will benefit from skilled therapeutic intervention in order to improve the following deficits and  impairments:  Abnormal gait, Decreased safety awareness, Impaired UE functional use, Impaired perceived functional ability, Decreased balance, Decreased knowledge of use of DME, Decreased mobility, Decreased strength, Postural dysfunction  Visit Diagnosis: Unsteadiness on feet  Other abnormalities of gait and mobility  Repeated falls  Muscle weakness (generalized)  Abnormal posture     Problem List Patient Active Problem List   Diagnosis Date Noted  . Dysphagia 12/12/2017  . Pain in the chest   . Chest pain 11/03/2012  . Chronic kidney disease, stage III (moderate) ([place]) 11/03/2012  . Dizziness 04/28/2011  . Carotid arterial disease ([place]) 06/08/2009  . HLD (hyperlipidemia) 10/02/2008  . HYPERTENSION, BENIGN 10/02/2008  . CAD, NATIVE VESSEL 10/02/2008  . CLOSED FRACTURE OF LATERAL MALLEOLUS 01/06/2008    Kaushik Maul , PT 02/14/2018, 12:06 PM  [facility] [address] [place], [place], [address] Phone: [phone]   Fax:  [phone]  Name: Carly [name] MRN: [id number] Date of Birth: 1918/07/12

## 2018-02-19 ENCOUNTER — Ambulatory Visit: Payer: Medicare Other | Admitting: Rehabilitative and Restorative Service Providers"

## 2018-02-25 ENCOUNTER — Other Ambulatory Visit: Payer: Self-pay | Admitting: Internal Medicine

## 2018-02-26 ENCOUNTER — Encounter: Payer: Self-pay | Admitting: Physical Therapy

## 2018-02-26 ENCOUNTER — Ambulatory Visit: Payer: Medicare Other | Admitting: Physical Therapy

## 2018-02-26 DIAGNOSIS — R296 Repeated falls: Secondary | ICD-10-CM | POA: Diagnosis not present

## 2018-02-26 DIAGNOSIS — R2681 Unsteadiness on feet: Secondary | ICD-10-CM

## 2018-02-26 DIAGNOSIS — R293 Abnormal posture: Secondary | ICD-10-CM

## 2018-02-26 DIAGNOSIS — M6281 Muscle weakness (generalized): Secondary | ICD-10-CM

## 2018-02-26 DIAGNOSIS — R2689 Other abnormalities of gait and mobility: Secondary | ICD-10-CM

## 2018-02-26 NOTE — Therapy (Signed)
[facility] [address] [place], [place], [address] Phone: [phone]   Fax:  [phone]  Physical Therapy Treatment  Patient Details  Name: Carly [name] MRN: [id number] Date of Birth: 08/31/1918 Referring Provider (PT): Dr. Dorris Carnes   Encounter Date: 02/26/2018  PT End of Session - 02/26/18 1241    Visit Number  6    Number of Visits  7    Date for PT Re-Evaluation  03/01/18    Authorization Type  Medicare Part A& B- * Requires 10th visit PN    PT Start Time  1102    PT Stop Time  1145    PT Time Calculation (min)  43 min    Activity Tolerance  Patient tolerated treatment well    Behavior During Therapy  [facility] for tasks assessed/performed;Anxious       Past Medical History:  Diagnosis Date  . CAD (coronary artery disease)    a. s/p PTCA/stent to LAD and PTCA of ramus in 2003. b. normal nuc 10/2012 with EF 87%  . Carotid arterial disease ([place])    a. 07/2014 - duplex 80-99% RICA, 8-33% LICA.  Marland Kitchen CKD (chronic kidney disease), stage III ([place])   . Cystocele   . Dyslipidemia   . Gout   . HTN (hypertension)   . Transitional cell carcinoma ([place])    a. 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  . Vaginal wall prolapse     Past Surgical History:  Procedure Laterality Date  . APPENDECTOMY    . CORONARY ANGIOPLASTY WITH STENT PLACEMENT    . NEPHRECTOMY     right    There were no vitals filed for this visit.  Subjective Assessment - 02/26/18 1239    Subjective  doing seated HEP independently; standing HEP with family - biggest concern is hand strength; doesn't feel she needs to be here    Patient is accompained by:  Family member    Pertinent History  CAD, HTN, HL, CKD, Gout, Transitional Cell Carcinoma     Patient Stated Goals  Reports goal of not wanting to fall and improve her balance and safety with functional mobility     Currently in Pain?  No/denies     Multiple Pain Sites  No                       [facility] Adult PT Treatment/Exercise - 02/26/18 0001      Ambulation/Gait   Gait velocity  2.2 ft/sec      Exercises   Other Exercises   sit to stand from arm chair - education on pushing up from arms or seated surface, disscussion on fine motor activities for hands: buttons, zippers, rubberband extensions, grasping stress ball          Balance Exercises - 02/26/18 1253      Balance Exercises: Standing   Standing Eyes Opened  Narrow base of support (BOS);2 reps;20 secs   in corner with RW in front    Tandem Stance  Eyes open;2 reps;20 secs   in corner with RW in fornt - modifies to half step       PT Education - 02/26/18 1253    Education Details  updated strengthening/balance activities; fine motor tasks; discussion on safety with RW with basket - different places to shop    Person(s) Educated  Patient    Methods  Explanation;Demonstration;Handout    Comprehension  Verbalized  understanding;Returned demonstration       PT Short Term Goals - 02/14/18 1127      PT SHORT TERM GOAL #1   Title  Pt will participate in the initiation of an HEP to improve strength, balance and safety with functional mobility     Baseline  The patient is doing HEP with her daughter.      Time  3    Period  Weeks    Status  Achieved      PT SHORT TERM GOAL #2   Title  Pt will improve gait speed to >/= 1.31 ft/sec with her RW indicating improvement with functional mobility     Baseline  10/8: 1.09 ft/ sec/ 2.01 ft/sec on 02/14/18.    Time  3    Period  Weeks    Status  Achieved      PT SHORT TERM GOAL #3   Title  Pt will demonstrate improvement in Berg score to >/= 30/56 indicating reduction in fall risk potential     Baseline  38/56.    Time  3    Period  Weeks    Status  Achieved      PT SHORT TERM GOAL #4   Title  Pt will improve 5x STS to </= 17 seconds from standard height chair with UE assist and without using posterior  thighs on chair for support indicating reduction in fall risk potential     Baseline  10/8: 24. 5 seconds indicating high fall risk potential  (20.34 seconds on 02/14/2018)    Time  3    Period  Weeks    Status  Partially Met      PT SHORT TERM GOAL #5   Title  Pt will ambulate 300 feet outdoors using her RW navigating paved surfaces, curbs, and ramps with min A demonstrating improvement in community ambulation    Baseline  Pt reports she only ambulates household distances with her RW 2/2 to fear of falling    Time  3    Period  Weeks    Status  Deferred        PT Long Term Goals - 02/26/18 1254      PT LONG TERM GOAL #1   Title  Pt will report independence and compliancy with HEP exercises to improve strength, balance and functional mobility     Time  6    Period  Weeks    Status  Achieved      PT LONG TERM GOAL #2   Title  Pt will improve gait speed to >/= 1.8 ft/sec using her RW indicating significant improvement in functional mobility     Baseline  02/26/18: 2.2 ft/sec    Time  6    Period  Weeks    Status  Achieved      PT LONG TERM GOAL #3   Title  Pt will improve her Berg balance score to >/= 33/56 indicating reduction in fall risk potential     Time  6    Period  Weeks    Status  New      PT LONG TERM GOAL #4   Title  Pt will improve 5x STS score to </=13.5 seconds from standard height chair with UE assist indicating reduction in fall risk potential and improvement in LE power     Baseline  02/26/18: 16.46 sec with hands on arm rests    Time  6    Period  Weeks  Status  On-going      PT LONG TERM GOAL #5   Title  Pt will ambulate 500 feet outdoors on paved surfaces including curbs and ramps with supervision and RW indicating an improvement in safety with community ambulation     Baseline  patient stating she does not ambulate in community other than to enter office buidlings/go to car due to fear of falling    Time  6    Period  Weeks    Status  Deferred       PT LONG TERM GOAL #6   Title  Pt will report experiencing no falls indicating improvement in safety with functional mobility     Baseline  2-3 falls in the past 6 month time period    Time  6    Period  Weeks    Status  New            Plan - 02/26/18 1242    Clinical Impression Statement  Assessed some LTG's with patient meeting gait speed goals with speed today 2.2 ft/sec. SPent a great deal of time discussing HEP and how to progress with supervision of family. Patient stating multiple times that she is ready to wrap up with POC - will plan to check remaining goals at next session.     PT Treatment/Interventions  ADLs/Self Care Home Management;Stair training;Functional mobility training;Therapeutic activities;Patient/family education;Therapeutic exercise;Balance training;Neuromuscular re-education;DME Instruction;Gait training    PT Next Visit Plan  Add to [place] as needed,, posture, hands for general function/strengthening.  Work on Engineer, materials at [employer].  D/C PLANNING WITH daughter and patient.    Consulted and Agree with Plan of Care  Patient;Family member/caregiver    Family Member Consulted  Daughter        Patient will benefit from skilled therapeutic intervention in order to improve the following deficits and impairments:  Abnormal gait, Decreased safety awareness, Impaired UE functional use, Impaired perceived functional ability, Decreased balance, Decreased knowledge of use of DME, Decreased mobility, Decreased strength, Postural dysfunction  Visit Diagnosis: Unsteadiness on feet  Other abnormalities of gait and mobility  Repeated falls  Muscle weakness (generalized)  Abnormal posture     Problem List Patient Active Problem List   Diagnosis Date Noted  . Dysphagia 12/12/2017  . Pain in the chest   . Chest pain 11/03/2012  . Chronic kidney disease, stage III (moderate) ([place]) 11/03/2012  . Dizziness 04/28/2011  . Carotid arterial disease ([place]) 06/08/2009   . HLD (hyperlipidemia) 10/02/2008  . HYPERTENSION, BENIGN 10/02/2008  . CAD, NATIVE VESSEL 10/02/2008  . CLOSED FRACTURE OF LATERAL MALLEOLUS 01/06/2008     Lanney Gins, PT, DPT Supplemental Physical Therapist 02/26/18 12:56 PM Pager: [phone] Office: [facility] [address] [place], [place], [address] Phone: [phone]   Fax:  [phone]  Name: KRYSTYNA CLECKLEY MRN: [id number] Date of Birth: [birth year]

## 2018-03-21 ENCOUNTER — Ambulatory Visit: Payer: Medicare Other | Attending: Internal Medicine | Admitting: Rehabilitative and Restorative Service Providers"

## 2018-03-21 DIAGNOSIS — R2689 Other abnormalities of gait and mobility: Secondary | ICD-10-CM

## 2018-03-21 DIAGNOSIS — M6281 Muscle weakness (generalized): Secondary | ICD-10-CM | POA: Insufficient documentation

## 2018-03-21 DIAGNOSIS — R296 Repeated falls: Secondary | ICD-10-CM | POA: Insufficient documentation

## 2018-03-21 DIAGNOSIS — R2681 Unsteadiness on feet: Secondary | ICD-10-CM | POA: Insufficient documentation

## 2018-03-22 ENCOUNTER — Encounter: Payer: Self-pay | Admitting: Rehabilitative and Restorative Service Providers"

## 2018-03-22 NOTE — Therapy (Signed)
[facility] [address] [place], [place], [address] Phone: [phone]   Fax:  [phone]  Physical Therapy Treatment  Patient Details  Name: Carly [name] MRN: [id number] Date of Birth: [birth year] Referring Provider (PT): Dr. Dorris Carnes   Encounter Date: 03/21/2018  PT End of Session - 03/21/18 1154    Visit Number  7    Number of Visits  7    Date for PT Re-Evaluation  03/01/18    Authorization Type  Medicare Part A& B- * Requires 10th visit PN    PT Start Time  1105    PT Stop Time  1145    PT Time Calculation (min)  40 min    Equipment Utilized During Treatment  Gait belt    Activity Tolerance  Patient tolerated treatment well    Behavior During Therapy  [facility] for tasks assessed/performed       Past Medical History:  Diagnosis Date  . CAD (coronary artery disease)    a. s/p PTCA/stent to LAD and PTCA of ramus in 2003. b. normal nuc 10/2012 with EF 87%  . Carotid arterial disease ([place])    a. 07/2014 - duplex 32-95% RICA, 1-88% LICA.  Marland Kitchen CKD (chronic kidney disease), stage III ([place])   . Cystocele   . Dyslipidemia   . Gout   . HTN (hypertension)   . Transitional cell carcinoma ([place])    a. 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  . Vaginal wall prolapse     Past Surgical History:  Procedure Laterality Date  . APPENDECTOMY    . CORONARY ANGIOPLASTY WITH STENT PLACEMENT    . NEPHRECTOMY     right    There were no vitals filed for this visit.  Subjective Assessment - 03/21/18 1128    Subjective  The patient's daughter reports she has to encourage her mother to perform HEP and do standing exercises.  The patient has not had any falls.      Patient is accompained by:  Family member    Pertinent History  CAD, HTN, HL, CKD, Gout, Transitional Cell Carcinoma     Patient Stated Goals  Reports goal of not wanting to fall and improve her balance and safety  with functional mobility     Currently in Pain?  No/denies         [facility] PT Assessment - 03/21/18 1133      Berg Balance Test   Sit to Stand  Able to stand  independently using hands    Standing Unsupported  Able to stand safely 2 minutes    Sitting with Back Unsupported but Feet Supported on Floor or Stool  Able to sit safely and securely 2 minutes    Stand to Sit  Sits safely with minimal use of hands    Transfers  Able to transfer safely, definite need of hands    Standing Unsupported with Eyes Closed  Able to stand 10 seconds safely    Standing Ubsupported with Feet Together  Able to place feet together independently and stand 1 minute safely    From Standing, Reach Forward with Outstretched Arm  Can reach forward >5 cm safely (2")    From Standing Position, Pick up Object from Floor  Able to pick up shoe, needs supervision    From Standing Position, Turn to Look Behind Over each Shoulder  Looks behind from both sides and weight shifts well    Turn  360 Degrees  Needs close supervision or verbal cueing    Standing Unsupported, Alternately Place Feet on Step/Stool  Able to complete >2 steps/needs minimal assist    Standing Unsupported, One Foot in Front  Able to plae foot ahead of the other independently and hold 30 seconds    Standing on One Leg  Tries to lift leg/unable to hold 3 seconds but remains standing independently    Total Score  41    Berg comment:  41/56 improved from 27/56 at eval                   [facility] Adult PT Treatment/Exercise - 03/21/18 1154      Ambulation/Gait   Ambulation/Gait  Yes    Ambulation/Gait Assistance  5: Supervision    Ambulation/Gait Assistance Details  Patient walked with RW and with rollator RW to trial.  She is safe with both devices.  For the rollator, emphasis was on functional use of the rollator in the kitchen.  Currently, the patinet drags her RW with one hand and carries a plate with another.  WE discussed using the seat of the  rollator as a tray since she has one at home.  Patient resistant to trying this due to feeling the current way works fine.     Ambulation Distance (Feet)  500 Feet    Assistive device  Rolling walker;4-wheeled walker      Self-Care   Self-Care  Other Self-Care Comments    Other Self-Care Comments   discussed options for use in the kitchen to improve safety; recommended continuation of HEP post d/c.      Therapeutic Activites    Therapeutic Activities  Other Therapeutic Activities    Other Therapeutic Activities  worked in the kitchen o n strategies to improve safety when opening the refrigerator and walking while carrying objects             PT Education - 03/22/18 1128    Education Details  reviewed HEP; educated on options for use in the kitchen (walker tray, rollater RW)       PT Short Term Goals - 02/14/18 1127      PT SHORT TERM GOAL #1   Title  Pt will participate in the initiation of an HEP to improve strength, balance and safety with functional mobility     Baseline  The patient is doing HEP with her daughter.      Time  3    Period  Weeks    Status  Achieved      PT SHORT TERM GOAL #2   Title  Pt will improve gait speed to >/= 1.31 ft/sec with her RW indicating improvement with functional mobility     Baseline  10/8: 1.09 ft/ sec/ 2.01 ft/sec on 02/14/18.    Time  3    Period  Weeks    Status  Achieved      PT SHORT TERM GOAL #3   Title  Pt will demonstrate improvement in Berg score to >/= 30/56 indicating reduction in fall risk potential     Baseline  38/56.    Time  3    Period  Weeks    Status  Achieved      PT SHORT TERM GOAL #4   Title  Pt will improve 5x STS to </= 17 seconds from standard height chair with UE assist and without using posterior thighs on chair for support indicating reduction in fall risk potential  Baseline  10/8: 24. 5 seconds indicating high fall risk potential  (20.34 seconds on 02/14/2018)    Time  3    Period  Weeks    Status   Partially Met      PT SHORT TERM GOAL #5   Title  Pt will ambulate 300 feet outdoors using her RW navigating paved surfaces, curbs, and ramps with min A demonstrating improvement in community ambulation    Baseline  Pt reports she only ambulates household distances with her RW 2/2 to fear of falling    Time  3    Period  Weeks    Status  Deferred        PT Long Term Goals - 03/21/18 1132      PT LONG TERM GOAL #1   Title  Pt will report independence and compliancy with HEP exercises to improve strength, balance and functional mobility     Time  6    Period  Weeks    Status  Achieved    Target Date  03/01/18      PT LONG TERM GOAL #2   Title  Pt will improve gait speed to >/= 1.8 ft/sec using her RW indicating significant improvement in functional mobility     Baseline  02/26/18: 2.2 ft/sec    Time  6    Period  Weeks    Status  Achieved      PT LONG TERM GOAL #3   Title  Pt will improve her Berg balance score to >/= 33/56 indicating reduction in fall risk potential     Baseline  41/56     Time  6    Period  Weeks    Status  Achieved      PT LONG TERM GOAL #4   Title  Pt will improve 5x STS score to </=13.5 seconds from standard height chair with UE assist indicating reduction in fall risk potential and improvement in LE power     Baseline  02/26/18: 16.46 sec with hands on arm rests    Time  6    Period  Weeks    Status  Not Met      PT LONG TERM GOAL #5   Title  Pt will ambulate 500 feet outdoors on paved surfaces including curbs and ramps with supervision and RW indicating an improvement in safety with community ambulation     Baseline  patient stating she does not ambulate in community other than to enter office buidlings/go to car due to fear of falling    Time  6    Period  Weeks    Status  Deferred      PT LONG TERM GOAL #6   Title  Pt will report experiencing no falls indicating improvement in safety with functional mobility     Baseline  2-3 falls in the  past 6 month time period    Time  6    Period  Weeks    Status  Achieved            Plan - 03/22/18 1129    Clinical Impression Statement  The patient has met LTGs except for 5 time sit<>stand time.  The patient's daughter has HEP and is able to help encourage patient to participate at home.     PT Treatment/Interventions  ADLs/Self Care Home Management;Stair training;Functional mobility training;Therapeutic activities;Patient/family education;Therapeutic exercise;Balance training;Neuromuscular re-education;DME Instruction;Gait training    PT Next Visit Plan  discharge    Consulted and  Agree with Plan of Care  Patient;Family member/caregiver    Family Member Consulted  Daughter        Patient will benefit from skilled therapeutic intervention in order to improve the following deficits and impairments:  Abnormal gait, Decreased safety awareness, Impaired UE functional use, Impaired perceived functional ability, Decreased balance, Decreased knowledge of use of DME, Decreased mobility, Decreased strength, Postural dysfunction  Visit Diagnosis: Unsteadiness on feet  Other abnormalities of gait and mobility  Muscle weakness (generalized)  Repeated falls    PHYSICAL THERAPY DISCHARGE SUMMARY  Visits from Start of Care: 7  Current functional level related to goals / functional outcomes: See above   Remaining deficits: Patient continues with weakness- working on strengthening with HEP   Education / Equipment: Administrator, [employer], safety in home, options for kitchen mobility   Plan: Patient agrees to discharge.  Patient goals were partially met. Patient is being discharged due to meeting the stated rehab goals.  ?????          Thank you for the referral of this patient. Rudell Cobb, MPT   [place] , [place] 03/22/2018, 11:39 AM  [facility] [address] [place], [place], [address] Phone:  [phone]   Fax:  [phone]  Name: Carly [name] MRN: [id number] Date of Birth: Jan 09, 1919

## 2018-03-26 DIAGNOSIS — R1312 Dysphagia, oropharyngeal phase: Secondary | ICD-10-CM | POA: Diagnosis not present

## 2018-03-26 DIAGNOSIS — M6281 Muscle weakness (generalized): Secondary | ICD-10-CM | POA: Diagnosis not present

## 2018-03-26 DIAGNOSIS — R634 Abnormal weight loss: Secondary | ICD-10-CM | POA: Diagnosis not present

## 2018-03-26 DIAGNOSIS — R600 Localized edema: Secondary | ICD-10-CM | POA: Diagnosis not present

## 2018-03-26 DIAGNOSIS — M109 Gout, unspecified: Secondary | ICD-10-CM | POA: Diagnosis not present

## 2018-03-26 DIAGNOSIS — E46 Unspecified protein-calorie malnutrition: Secondary | ICD-10-CM | POA: Diagnosis not present

## 2018-03-26 DIAGNOSIS — K5900 Constipation, unspecified: Secondary | ICD-10-CM | POA: Diagnosis not present

## 2018-03-26 DIAGNOSIS — N183 Chronic kidney disease, stage 3 (moderate): Secondary | ICD-10-CM | POA: Diagnosis not present

## 2018-03-26 DIAGNOSIS — I10 Essential (primary) hypertension: Secondary | ICD-10-CM | POA: Diagnosis not present

## 2018-03-26 DIAGNOSIS — D509 Iron deficiency anemia, unspecified: Secondary | ICD-10-CM | POA: Diagnosis not present

## 2018-06-04 DIAGNOSIS — M810 Age-related osteoporosis without current pathological fracture: Secondary | ICD-10-CM | POA: Diagnosis not present

## 2018-06-04 DIAGNOSIS — S51801A Unspecified open wound of right forearm, initial encounter: Secondary | ICD-10-CM | POA: Diagnosis not present

## 2018-06-04 DIAGNOSIS — R0789 Other chest pain: Secondary | ICD-10-CM | POA: Diagnosis not present

## 2018-06-04 DIAGNOSIS — Z9181 History of falling: Secondary | ICD-10-CM | POA: Diagnosis not present

## 2018-06-04 DIAGNOSIS — R809 Proteinuria, unspecified: Secondary | ICD-10-CM | POA: Diagnosis not present

## 2018-06-04 DIAGNOSIS — R2240 Localized swelling, mass and lump, unspecified lower limb: Secondary | ICD-10-CM | POA: Diagnosis not present

## 2018-06-04 DIAGNOSIS — S81801A Unspecified open wound, right lower leg, initial encounter: Secondary | ICD-10-CM | POA: Diagnosis not present

## 2018-06-04 DIAGNOSIS — E46 Unspecified protein-calorie malnutrition: Secondary | ICD-10-CM | POA: Diagnosis not present

## 2018-06-04 DIAGNOSIS — M109 Gout, unspecified: Secondary | ICD-10-CM | POA: Diagnosis not present

## 2018-06-04 DIAGNOSIS — R634 Abnormal weight loss: Secondary | ICD-10-CM | POA: Diagnosis not present

## 2018-06-04 DIAGNOSIS — R296 Repeated falls: Secondary | ICD-10-CM | POA: Diagnosis not present

## 2018-06-04 DIAGNOSIS — E782 Mixed hyperlipidemia: Secondary | ICD-10-CM | POA: Diagnosis not present

## 2018-06-04 DIAGNOSIS — R944 Abnormal results of kidney function studies: Secondary | ICD-10-CM | POA: Diagnosis not present

## 2018-06-04 DIAGNOSIS — D509 Iron deficiency anemia, unspecified: Secondary | ICD-10-CM | POA: Diagnosis not present

## 2018-06-04 DIAGNOSIS — I10 Essential (primary) hypertension: Secondary | ICD-10-CM | POA: Diagnosis not present

## 2018-06-04 DIAGNOSIS — R1310 Dysphagia, unspecified: Secondary | ICD-10-CM | POA: Diagnosis not present

## 2018-06-04 DIAGNOSIS — R1312 Dysphagia, oropharyngeal phase: Secondary | ICD-10-CM | POA: Diagnosis not present

## 2018-07-26 DIAGNOSIS — Z9181 History of falling: Secondary | ICD-10-CM | POA: Diagnosis not present

## 2018-07-26 DIAGNOSIS — S90821A Blister (nonthermal), right foot, initial encounter: Secondary | ICD-10-CM | POA: Diagnosis not present

## 2018-07-26 DIAGNOSIS — M79674 Pain in right toe(s): Secondary | ICD-10-CM | POA: Diagnosis not present

## 2018-08-05 DIAGNOSIS — M79671 Pain in right foot: Secondary | ICD-10-CM | POA: Diagnosis not present

## 2018-08-05 DIAGNOSIS — S92414A Nondisplaced fracture of proximal phalanx of right great toe, initial encounter for closed fracture: Secondary | ICD-10-CM | POA: Diagnosis not present

## 2018-09-24 DIAGNOSIS — D509 Iron deficiency anemia, unspecified: Secondary | ICD-10-CM | POA: Diagnosis not present

## 2018-09-24 DIAGNOSIS — R634 Abnormal weight loss: Secondary | ICD-10-CM | POA: Diagnosis not present

## 2018-09-24 DIAGNOSIS — R1312 Dysphagia, oropharyngeal phase: Secondary | ICD-10-CM | POA: Diagnosis not present

## 2018-09-24 DIAGNOSIS — E46 Unspecified protein-calorie malnutrition: Secondary | ICD-10-CM | POA: Diagnosis not present

## 2018-09-24 DIAGNOSIS — M109 Gout, unspecified: Secondary | ICD-10-CM | POA: Diagnosis not present

## 2018-09-24 DIAGNOSIS — Z9181 History of falling: Secondary | ICD-10-CM | POA: Diagnosis not present

## 2018-09-24 DIAGNOSIS — R944 Abnormal results of kidney function studies: Secondary | ICD-10-CM | POA: Diagnosis not present

## 2018-09-24 DIAGNOSIS — I10 Essential (primary) hypertension: Secondary | ICD-10-CM | POA: Diagnosis not present

## 2018-09-24 DIAGNOSIS — R2240 Localized swelling, mass and lump, unspecified lower limb: Secondary | ICD-10-CM | POA: Diagnosis not present

## 2018-10-08 ENCOUNTER — Other Ambulatory Visit: Payer: Self-pay | Admitting: Internal Medicine

## 2018-10-25 ENCOUNTER — Telehealth: Payer: Self-pay

## 2018-10-25 NOTE — Telephone Encounter (Signed)
Called and spoke with patients daughter [facility] DPR) and confirmed that upcoming appointment with Dr Harrington Challenger will be virtual video visit and they do not come into office for appointment.

## 2018-11-09 NOTE — Progress Notes (Signed)
Virtual Visit via Video Note   This visit type was conducted due to national recommendations for restrictions regarding the COVID-19 Pandemic (e.g. social distancing) in an effort to limit this patient's exposure and mitigate transmission in our community.  Due to her co-morbid illnesses, this patient is at least at moderate risk for complications without adequate follow up.  This format is felt to be most appropriate for this patient at this time.  All issues noted in this document were discussed and addressed.  A limited physical exam was performed with this format.  Please refer to the patient's chart for her consent to telehealth for [facility].   Date:  11/11/2018   ID:  Carly [name], DOB [birth year], MRN [id number]  Patient Location: Home Provider Location: Home  PCP:  Celene Squibb, MD  Cardiologist:  Harrington Challenger  Evaluation Performed:  Follow-Up Visit  Chief Complaint:  F?U of CAD    History of Present Illness:    Carly [name] is a 85 y.o. female with a hx of CAD (s/p PTCA/stent to the LAD and PTCA of the ramus in 2003)   She is s/p myovue in 2014   Past that was neg for ischemia   She also has a hx of HTN and HL and mod CV dz (USN in 2016     Patient says her breathing is OK  No CP   No dizziness Uses walker    Has problems with hand strength   Hurts activity of daily livigng  BP ranges         124-170   Labile    Usuall 130s   The patient /does not have symptoms concerning for COVID-19 infection (fever, chills, cough, or new shortness of breath).    Past Medical History:  Diagnosis Date  . CAD (coronary artery disease)    a. s/p PTCA/stent to LAD and PTCA of ramus in 2003. b. normal nuc 10/2012 with EF 87%  . Carotid arterial disease ([place])    a. 07/2014 - duplex 67-61% RICA, 9-50% LICA.  Marland Kitchen CKD (chronic kidney disease), stage III ([place])   . Cystocele   . Dyslipidemia   . Gout   . HTN (hypertension)   . Transitional cell carcinoma ([place])    a. transitional cell carcinoma  of the right pelvic ureter s/p right nephroureterectomy 08/2005.  . Vaginal wall prolapse    Past Surgical History:  Procedure Laterality Date  . APPENDECTOMY    . CORONARY ANGIOPLASTY WITH STENT PLACEMENT    . NEPHRECTOMY     right     Current Meds  Medication Sig  . acetaminophen (TYLENOL) 500 MG tablet Take 500 mg by mouth every 6 (six) hours as needed for mild pain.  Marland Kitchen amLODipine-benazepril (LOTREL) 5-20 MG capsule TAKE (1) CAPSULE BY MOUTH ONCE DAILY.  Marland Kitchen aspirin 81 MG tablet Take 81 mg by mouth daily.    . metoprolol succinate (TOPROL-XL) 50 MG 24 hr tablet TAKE 1 TABLET BY MOUTH ONCE DAILY.  . nitroGLYCERIN (NITROSTAT) 0.4 MG SL tablet Place 1 tablet (0.4 mg total) under the tongue every 5 (five) minutes as needed for chest pain.  . raloxifene (EVISTA) 60 MG tablet Take 60 mg by mouth daily.    . simvastatin (ZOCOR) 10 MG tablet TAKE (1) TABLET BY MOUTH AT BEDTIME.  . [DISCONTINUED] nitroGLYCERIN (NITROSTAT) 0.4 MG SL tablet Place 1 tablet (0.4 mg total) under the tongue every 5 (five) minutes as needed for chest pain.  Allergies:   Patient has no known allergies.   Social History   Tobacco Use  . Smoking status: Never Smoker  . Smokeless tobacco: Never Used  Substance Use Topics  . Alcohol use: No  . Drug use: No     Family Hx: The patient's family history includes Cancer in her unknown relative; Diabetes in her father; Heart attack in her father; Heart disease in her unknown relative.  ROS:   Please see the history of present illness.    All other systems reviewed and are negative.   Prior CV studies:   The following studies were reviewed today:    Labs/Other Tests and Data Reviewed:    EKG:  No ECG reviewed.  Recent Labs: 12/08/2017: ALT 17; BUN 42; Creatinine, Ser 1.56; Hemoglobin 11.3; Platelets 182; Potassium 4.3; Sodium 138   Recent Lipid Panel Lab Results  Component Value Date/Time   CHOL 156 05/20/2015 12:23 PM   TRIG 131 05/20/2015 12:23 PM    HDL 61 05/20/2015 12:23 PM   CHOLHDL 2.6 05/20/2015 12:23 PM   LDLCALC 69 05/20/2015 12:23 PM   LDLDIRECT 72.6 11/10/2013 09:49 AM    Wt Readings from Last 3 Encounters:  11/11/18 111 lb (50.3 kg)  12/24/17 114 lb 9.6 oz (52 kg)  12/12/17 116 lb (52.6 kg)     Objective:    Vital Signs:  BP (!) 163/79   Pulse 70   Ht 5' (1.524 m)   Wt 111 lb (50.3 kg)   BMI 21.68 kg/m     ASSESSMENT & PLAN:    1. CAD   I am not convinced of any active ischemia   Follow   2  Hx of HTN   BP is labile   I would not push further   Usually good   3  HL  Will need to follow   Keep on same meds     COVID-19 Education: The signs and symptoms of COVID-19 were discussed with the patient and how to seek care for testing (follow up with PCP or arrange E-visit).  The importance of social distancing was discussed today.  Time:   Today, I have spent 15 minutes with the patient with telehealth technology discussing the above problems.     Medication Adjustments/Labs and Tests Ordered: Current medicines are reviewed at length with the patient today.  Concerns regarding medicines are outlined above.   Tests Ordered: No orders of the defined types were placed in this encounter.   Medication Changes: Meds ordered this encounter  Medications  . nitroGLYCERIN (NITROSTAT) 0.4 MG SL tablet    Sig: Place 1 tablet (0.4 mg total) under the tongue every 5 (five) minutes as needed for chest pain.    Dispense:  25 tablet    Refill:  3    Follow Up: 6 mon  Signed, Dorris Carnes, MD  11/11/2018 11:03 AM    [place]

## 2018-11-11 ENCOUNTER — Telehealth (INDEPENDENT_AMBULATORY_CARE_PROVIDER_SITE_OTHER): Payer: Medicare Other | Admitting: Internal Medicine

## 2018-11-11 ENCOUNTER — Encounter: Payer: Self-pay | Admitting: Internal Medicine

## 2018-11-11 ENCOUNTER — Other Ambulatory Visit: Payer: Self-pay

## 2018-11-11 DIAGNOSIS — E782 Mixed hyperlipidemia: Secondary | ICD-10-CM | POA: Diagnosis not present

## 2018-11-11 DIAGNOSIS — I10 Essential (primary) hypertension: Secondary | ICD-10-CM

## 2018-11-11 DIAGNOSIS — I2510 Atherosclerotic heart disease of native coronary artery without angina pectoris: Secondary | ICD-10-CM | POA: Diagnosis not present

## 2018-11-11 MED ORDER — NITROGLYCERIN 0.4 MG SL SUBL: 0.4000 mg | SUBLINGUAL_TABLET | SUBLINGUAL | 3 refills | Status: DC | PRN

## 2018-11-11 NOTE — Patient Instructions (Signed)
Medication Instructions:  Your physician recommends that you continue on your current medications as directed. Please refer to the Current Medication list given to you today.  If you need a refill on your cardiac medications before your next appointment, please call your pharmacy.   Lab work: None If you have labs (blood work) drawn today and your tests are completely normal, you will receive your results only by: Marland Kitchen MyChart Message (if you have MyChart) OR . A paper copy in the mail If you have any lab test that is abnormal or we need to change your treatment, we will call you to review the results.  Testing/Procedures: None  Follow-Up: At [facility], you and your health needs are our priority.  As part of our continuing mission to provide you with exceptional heart care, we have created designated Provider Care Teams.  These Care Teams include your primary Cardiologist (physician) and Advanced Practice Providers (APPs -  Physician Assistants and Nurse Practitioners) who all work together to provide you with the care you need, when you need it. You will need a follow up appointment in:  6 months.  Please call our office 2 months in advance to schedule this appointment.  You may see Dr. Harrington Challenger or one of the following Advanced Practice Providers on your designated Care Team: Richardson Dopp, PA-C [place], [place] . Daune Perch, NP  Any Other Special Instructions Will Be Listed Below (If Applicable).

## 2018-12-02 DIAGNOSIS — M109 Gout, unspecified: Secondary | ICD-10-CM | POA: Diagnosis not present

## 2018-12-02 DIAGNOSIS — E46 Unspecified protein-calorie malnutrition: Secondary | ICD-10-CM | POA: Diagnosis not present

## 2018-12-02 DIAGNOSIS — R944 Abnormal results of kidney function studies: Secondary | ICD-10-CM | POA: Diagnosis not present

## 2018-12-02 DIAGNOSIS — I10 Essential (primary) hypertension: Secondary | ICD-10-CM | POA: Diagnosis not present

## 2018-12-02 DIAGNOSIS — R1312 Dysphagia, oropharyngeal phase: Secondary | ICD-10-CM | POA: Diagnosis not present

## 2018-12-02 DIAGNOSIS — R2240 Localized swelling, mass and lump, unspecified lower limb: Secondary | ICD-10-CM | POA: Diagnosis not present

## 2018-12-02 DIAGNOSIS — D509 Iron deficiency anemia, unspecified: Secondary | ICD-10-CM | POA: Diagnosis not present

## 2019-01-06 DIAGNOSIS — R1312 Dysphagia, oropharyngeal phase: Secondary | ICD-10-CM | POA: Diagnosis not present

## 2019-01-06 DIAGNOSIS — R944 Abnormal results of kidney function studies: Secondary | ICD-10-CM | POA: Diagnosis not present

## 2019-01-06 DIAGNOSIS — I10 Essential (primary) hypertension: Secondary | ICD-10-CM | POA: Diagnosis not present

## 2019-01-06 DIAGNOSIS — D509 Iron deficiency anemia, unspecified: Secondary | ICD-10-CM | POA: Diagnosis not present

## 2019-01-06 DIAGNOSIS — E782 Mixed hyperlipidemia: Secondary | ICD-10-CM | POA: Diagnosis not present

## 2019-01-06 DIAGNOSIS — R296 Repeated falls: Secondary | ICD-10-CM | POA: Diagnosis not present

## 2019-01-06 DIAGNOSIS — R2240 Localized swelling, mass and lump, unspecified lower limb: Secondary | ICD-10-CM | POA: Diagnosis not present

## 2019-01-06 DIAGNOSIS — M810 Age-related osteoporosis without current pathological fracture: Secondary | ICD-10-CM | POA: Diagnosis not present

## 2019-01-06 DIAGNOSIS — M79674 Pain in right toe(s): Secondary | ICD-10-CM | POA: Diagnosis not present

## 2019-01-06 DIAGNOSIS — E46 Unspecified protein-calorie malnutrition: Secondary | ICD-10-CM | POA: Diagnosis not present

## 2019-01-06 DIAGNOSIS — I2510 Atherosclerotic heart disease of native coronary artery without angina pectoris: Secondary | ICD-10-CM | POA: Diagnosis not present

## 2019-01-06 DIAGNOSIS — R634 Abnormal weight loss: Secondary | ICD-10-CM | POA: Diagnosis not present

## 2019-01-06 DIAGNOSIS — M109 Gout, unspecified: Secondary | ICD-10-CM | POA: Diagnosis not present

## 2019-01-06 DIAGNOSIS — Z9181 History of falling: Secondary | ICD-10-CM | POA: Diagnosis not present

## 2019-01-06 DIAGNOSIS — R0789 Other chest pain: Secondary | ICD-10-CM | POA: Diagnosis not present

## 2019-01-09 DIAGNOSIS — Z23 Encounter for immunization: Secondary | ICD-10-CM | POA: Diagnosis not present

## 2019-01-13 ENCOUNTER — Other Ambulatory Visit: Payer: Self-pay | Admitting: Internal Medicine

## 2019-01-17 DIAGNOSIS — Z9181 History of falling: Secondary | ICD-10-CM | POA: Diagnosis not present

## 2019-01-17 DIAGNOSIS — R944 Abnormal results of kidney function studies: Secondary | ICD-10-CM | POA: Diagnosis not present

## 2019-01-17 DIAGNOSIS — M109 Gout, unspecified: Secondary | ICD-10-CM | POA: Diagnosis not present

## 2019-01-17 DIAGNOSIS — R2240 Localized swelling, mass and lump, unspecified lower limb: Secondary | ICD-10-CM | POA: Diagnosis not present

## 2019-01-17 DIAGNOSIS — D509 Iron deficiency anemia, unspecified: Secondary | ICD-10-CM | POA: Diagnosis not present

## 2019-01-17 DIAGNOSIS — R1312 Dysphagia, oropharyngeal phase: Secondary | ICD-10-CM | POA: Diagnosis not present

## 2019-01-17 DIAGNOSIS — E46 Unspecified protein-calorie malnutrition: Secondary | ICD-10-CM | POA: Diagnosis not present

## 2019-01-17 DIAGNOSIS — I10 Essential (primary) hypertension: Secondary | ICD-10-CM | POA: Diagnosis not present

## 2019-01-17 DIAGNOSIS — R634 Abnormal weight loss: Secondary | ICD-10-CM | POA: Diagnosis not present

## 2019-01-17 DIAGNOSIS — I2510 Atherosclerotic heart disease of native coronary artery without angina pectoris: Secondary | ICD-10-CM | POA: Diagnosis not present

## 2019-02-27 DIAGNOSIS — B029 Zoster without complications: Secondary | ICD-10-CM | POA: Diagnosis not present

## 2019-02-28 ENCOUNTER — Other Ambulatory Visit: Payer: Self-pay

## 2019-04-07 ENCOUNTER — Other Ambulatory Visit: Payer: Self-pay | Admitting: Internal Medicine

## 2019-04-17 ENCOUNTER — Telehealth: Payer: Self-pay | Admitting: Internal Medicine

## 2019-04-17 DIAGNOSIS — D72829 Elevated white blood cell count, unspecified: Secondary | ICD-10-CM | POA: Diagnosis not present

## 2019-04-17 NOTE — Telephone Encounter (Signed)
New message:     Patient daughter calling to see if her mother is able to take the covid shot. Please call patient daughter back.

## 2019-04-17 NOTE — Telephone Encounter (Signed)
Spoke with patient and her daughter. Adv Dr. Harrington Challenger is recommending vaccine and there is no reason she should not. Provided information for [facility] to see if she can schedule there, they live in different county and do not have info yet for their county.  The patient is apprehensive because she is 86 years old.  I reinforced she should have vaccine.  She would like to just make sure Dr. Harrington Challenger thinks she should get it.  I told her I would pass on the message and call her back with Dr. Alan Ripper reply.

## 2019-04-18 NOTE — Telephone Encounter (Signed)
Tried to call pt   Left msg    PT should get vaccine.

## 2019-04-25 DIAGNOSIS — Z23 Encounter for immunization: Secondary | ICD-10-CM | POA: Diagnosis not present

## 2019-05-12 ENCOUNTER — Ambulatory Visit: Payer: Medicare Other | Admitting: Internal Medicine

## 2019-05-23 DIAGNOSIS — Z23 Encounter for immunization: Secondary | ICD-10-CM | POA: Diagnosis not present

## 2019-06-06 ENCOUNTER — Ambulatory Visit: Payer: Medicare Other | Admitting: Internal Medicine

## 2019-06-25 ENCOUNTER — Telehealth: Payer: Self-pay | Admitting: Internal Medicine

## 2019-06-25 NOTE — Telephone Encounter (Signed)
Patient's daughter Windell Hummingbird calling to request she come with the patient to her appointment 06/26/2019, because she states the patient can't walk in by herself.

## 2019-06-25 NOTE — Telephone Encounter (Signed)
Noted  

## 2019-06-26 ENCOUNTER — Other Ambulatory Visit: Payer: Self-pay

## 2019-06-26 ENCOUNTER — Ambulatory Visit (INDEPENDENT_AMBULATORY_CARE_PROVIDER_SITE_OTHER): Payer: Medicare Other | Admitting: Internal Medicine

## 2019-06-26 ENCOUNTER — Encounter: Payer: Self-pay | Admitting: Internal Medicine

## 2019-06-26 VITALS — BP 138/72 | HR 74 | Ht 61.0 in | Wt 115.2 lb

## 2019-06-26 DIAGNOSIS — I2510 Atherosclerotic heart disease of native coronary artery without angina pectoris: Secondary | ICD-10-CM

## 2019-06-26 DIAGNOSIS — E782 Mixed hyperlipidemia: Secondary | ICD-10-CM

## 2019-06-26 NOTE — Progress Notes (Signed)
Cardiology Office Note   Date:  06/26/2019   ID:  Carly [name], Carly [name] [birth year], MRN [id number]  PCP:  Celene Squibb, MD  Cardiologist:   Dorris Carnes, MD    F/u of CAD     History of Present Illness: Carly [name] is a 86 y.o. female with a history ofCAD (s/p PTCA/Stent to the LAD and PTCA to ramus in 2003). Sestamibi was negative fir ischemia Also a hsitory of HTN and HL   Since she was last in clinic her breathing is OK  She denies CP   COntinues to complain that she cant do much with her hands   Slowing down      No outpatient medications have been marked as taking for the 06/26/19 encounter (Office Visit) with [name], MD.     Allergies:   Patient has no known allergies.   Past Medical History:  Diagnosis Date  . CAD (coronary artery disease)    a. s/p PTCA/stent to LAD and PTCA of ramus in 2003. b. normal nuc 10/2012 with EF 87%  . Carotid arterial disease ([place])    a. 07/2014 - duplex 75-17% RICA, 0-01% LICA.  Marland Kitchen CKD (chronic kidney disease), stage III   . Cystocele   . Dyslipidemia   . Gout   . HTN (hypertension)   . Transitional cell carcinoma ([place])    a. 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  . Vaginal wall prolapse     Past Surgical History:  Procedure Laterality Date  . APPENDECTOMY    . CORONARY ANGIOPLASTY WITH STENT PLACEMENT    . NEPHRECTOMY     right     Social History:  The patient  reports that she has never smoked. She has never used smokeless tobacco. She reports that she does not drink alcohol or use drugs.   Family History:  The patient's family history includes Cancer in her unknown relative; Diabetes in her father; Heart attack in her father; Heart disease in her unknown relative.    ROS:  Please see the history of present illness. All other systems are reviewed and  Negative to the above problem except as noted.    PHYSICAL EXAM: VS:  BP 138/72   Pulse 74   Ht 5\' 1"   (1.549 m)   Wt 115 lb 3.2 oz (52.3 kg)   SpO2 95%   BMI 21.77 kg/m   GEN: Thin 86 yo  in no acute distress  HEENT: normal  Neck: JVP is not elevtated  Cardiac: RRR; no murmurs, rubs, or gallops, Trivial  edema  Respiratory:  clear to auscultation bilaterally,  GI: soft, nontender, nondistended, + BS  No hepatomegaly  MS: no deformity Moving all extremities   Skin: warm and dry, no rash Neuro:  Strength and sensation are intact Psych: euthymic mood, full affect   EKG:  EKG is  ordered today.   SR 74 bpm      Lipid Panel    Component Value Date/Time   CHOL 156 05/20/2015 1223   TRIG 131 05/20/2015 1223   HDL 61 05/20/2015 1223   CHOLHDL 2.6 05/20/2015 1223   VLDL 26 05/20/2015 1223   LDLCALC 69 05/20/2015 1223   LDLDIRECT 72.6 11/10/2013 0949      Wt Readings from Last 3 Encounters:  06/26/19 115 lb 3.2 oz (52.3 kg)  11/11/18 111 lb (50.3 kg)  12/24/17 114 lb 9.6 oz (52 kg)  ASSESSMENT AND PLAN:  1  CAD   No symptoms of angina  Follow       2  HTN   BP is OK  Keep on same meds  3    HL   Keep on statin   4   Falls   Pt denies   F/U in spring    Current medicines are reviewed at length with the patient today.  The patient does not have concerns regarding medicines.  Signed, Dorris Carnes, MD  06/26/2019 3:25 PM    [facility] [place], [place], [place]  [address] Phone: [phone]; Fax: [phone]

## 2019-06-26 NOTE — Patient Instructions (Signed)
Medication Instructions:  No changes *If you need a refill on your cardiac medications before your next appointment, please call your pharmacy*   Lab Work: None  Testing/Procedures: None   Follow-Up: At [facility], you and your health needs are our priority.  As part of our continuing mission to provide you with exceptional heart care, we have created designated Provider Care Teams.  These Care Teams include your primary Cardiologist (physician) and Advanced Practice Providers (APPs -  Physician Assistants and Nurse Practitioners) who all work together to provide you with the care you need, when you need it.  Your next appointment:   5 month(s)  The format for your next appointment:   Either In Person or Virtual  Provider:   You may see Dr. Dorris Carnes or one of the following Advanced Practice Providers on your designated Care Team:    Richardson Dopp, PA-C  [place], [place]  Daune Perch, NP    Other Instructions

## 2019-07-01 NOTE — Addendum Note (Signed)
Addended by: Rose Phi on: 07/01/2019 11:07 AM   Modules accepted: Orders

## 2019-07-24 DIAGNOSIS — F419 Anxiety disorder, unspecified: Secondary | ICD-10-CM | POA: Diagnosis not present

## 2019-07-24 DIAGNOSIS — Z0001 Encounter for general adult medical examination with abnormal findings: Secondary | ICD-10-CM | POA: Diagnosis not present

## 2019-07-24 DIAGNOSIS — M1990 Unspecified osteoarthritis, unspecified site: Secondary | ICD-10-CM | POA: Diagnosis not present

## 2019-07-24 DIAGNOSIS — I2510 Atherosclerotic heart disease of native coronary artery without angina pectoris: Secondary | ICD-10-CM | POA: Diagnosis not present

## 2019-07-24 DIAGNOSIS — R634 Abnormal weight loss: Secondary | ICD-10-CM | POA: Diagnosis not present

## 2019-07-24 DIAGNOSIS — D509 Iron deficiency anemia, unspecified: Secondary | ICD-10-CM | POA: Diagnosis not present

## 2019-07-24 DIAGNOSIS — R2240 Localized swelling, mass and lump, unspecified lower limb: Secondary | ICD-10-CM | POA: Diagnosis not present

## 2019-07-24 DIAGNOSIS — Z9181 History of falling: Secondary | ICD-10-CM | POA: Diagnosis not present

## 2019-07-24 DIAGNOSIS — M109 Gout, unspecified: Secondary | ICD-10-CM | POA: Diagnosis not present

## 2019-07-24 DIAGNOSIS — R944 Abnormal results of kidney function studies: Secondary | ICD-10-CM | POA: Diagnosis not present

## 2019-07-24 DIAGNOSIS — I10 Essential (primary) hypertension: Secondary | ICD-10-CM | POA: Diagnosis not present

## 2019-07-24 DIAGNOSIS — R1312 Dysphagia, oropharyngeal phase: Secondary | ICD-10-CM | POA: Diagnosis not present

## 2019-07-24 DIAGNOSIS — E46 Unspecified protein-calorie malnutrition: Secondary | ICD-10-CM | POA: Diagnosis not present

## 2019-10-28 DIAGNOSIS — R634 Abnormal weight loss: Secondary | ICD-10-CM | POA: Diagnosis not present

## 2019-10-28 DIAGNOSIS — I10 Essential (primary) hypertension: Secondary | ICD-10-CM | POA: Diagnosis not present

## 2019-10-28 DIAGNOSIS — Z9181 History of falling: Secondary | ICD-10-CM | POA: Diagnosis not present

## 2019-10-28 DIAGNOSIS — M1990 Unspecified osteoarthritis, unspecified site: Secondary | ICD-10-CM | POA: Diagnosis not present

## 2019-10-28 DIAGNOSIS — R1312 Dysphagia, oropharyngeal phase: Secondary | ICD-10-CM | POA: Diagnosis not present

## 2019-10-28 DIAGNOSIS — I2510 Atherosclerotic heart disease of native coronary artery without angina pectoris: Secondary | ICD-10-CM | POA: Diagnosis not present

## 2019-10-28 DIAGNOSIS — R944 Abnormal results of kidney function studies: Secondary | ICD-10-CM | POA: Diagnosis not present

## 2019-10-28 DIAGNOSIS — M109 Gout, unspecified: Secondary | ICD-10-CM | POA: Diagnosis not present

## 2019-10-28 DIAGNOSIS — F419 Anxiety disorder, unspecified: Secondary | ICD-10-CM | POA: Diagnosis not present

## 2019-10-28 DIAGNOSIS — E46 Unspecified protein-calorie malnutrition: Secondary | ICD-10-CM | POA: Diagnosis not present

## 2019-10-28 DIAGNOSIS — D509 Iron deficiency anemia, unspecified: Secondary | ICD-10-CM | POA: Diagnosis not present

## 2019-10-28 DIAGNOSIS — R2240 Localized swelling, mass and lump, unspecified lower limb: Secondary | ICD-10-CM | POA: Diagnosis not present

## 2019-11-17 DIAGNOSIS — C44729 Squamous cell carcinoma of skin of left lower limb, including hip: Secondary | ICD-10-CM | POA: Diagnosis not present

## 2019-12-19 ENCOUNTER — Ambulatory Visit: Payer: Medicare Other | Admitting: Internal Medicine

## 2020-01-05 DIAGNOSIS — Z08 Encounter for follow-up examination after completed treatment for malignant neoplasm: Secondary | ICD-10-CM | POA: Diagnosis not present

## 2020-01-05 DIAGNOSIS — Z85828 Personal history of other malignant neoplasm of skin: Secondary | ICD-10-CM | POA: Diagnosis not present

## 2020-01-21 DIAGNOSIS — Z23 Encounter for immunization: Secondary | ICD-10-CM | POA: Diagnosis not present

## 2020-02-09 ENCOUNTER — Ambulatory Visit (INDEPENDENT_AMBULATORY_CARE_PROVIDER_SITE_OTHER): Payer: Medicare Other | Admitting: Internal Medicine

## 2020-02-09 ENCOUNTER — Encounter: Payer: Self-pay | Admitting: Internal Medicine

## 2020-02-09 ENCOUNTER — Other Ambulatory Visit: Payer: Self-pay

## 2020-02-09 VITALS — BP 140/74 | HR 77 | Ht 61.5 in | Wt 118.8 lb

## 2020-02-09 DIAGNOSIS — I2510 Atherosclerotic heart disease of native coronary artery without angina pectoris: Secondary | ICD-10-CM | POA: Diagnosis not present

## 2020-02-09 DIAGNOSIS — E782 Mixed hyperlipidemia: Secondary | ICD-10-CM | POA: Diagnosis not present

## 2020-02-09 MED ORDER — CLONIDINE HCL 0.1 MG PO TABS: ORAL_TABLET | ORAL | 0 refills | Status: DC

## 2020-02-09 MED ORDER — AMLODIPINE BESY-BENAZEPRIL HCL 5-20 MG PO CAPS: ORAL_CAPSULE | ORAL | 3 refills | Status: DC

## 2020-02-09 MED ORDER — NITROGLYCERIN 0.4 MG SL SUBL: 0.4000 mg | SUBLINGUAL_TABLET | SUBLINGUAL | 3 refills | Status: DC | PRN

## 2020-02-09 MED ORDER — METOPROLOL SUCCINATE ER 50 MG PO TB24: 50.0000 mg | ORAL_TABLET | Freq: Every day | ORAL | 3 refills | Status: DC

## 2020-02-09 MED ORDER — SIMVASTATIN 10 MG PO TABS: 10.0000 mg | ORAL_TABLET | Freq: Every day | ORAL | 3 refills | Status: DC

## 2020-02-09 NOTE — Patient Instructions (Signed)
Medication Instructions:  SEE BELOW.   *If you need a refill on your cardiac medications before your next appointment, please call your pharmacy*   Lab Work: TODAY: CBC, BMET, TSH, LIPIDS If you have labs (blood work) drawn today and your tests are completely normal, you will receive your results only by: Marland Kitchen MyChart Message (if you have MyChart) OR . A paper copy in the mail If you have any lab test that is abnormal or we need to change your treatment, we will call you to review the results.   Testing/Procedures: NONE   Follow-Up: At [facility], you and your health needs are our priority.  As part of our continuing mission to provide you with exceptional heart care, we have created designated Provider Care Teams.  These Care Teams include your primary Cardiologist (physician) and Advanced Practice Providers (APPs -  Physician Assistants and Nurse Practitioners) who all work together to provide you with the care you need, when you need it.  Your next appointment:   9 month(s)  july  The format for your next appointment:   In Person  Provider:   You may see Dorris Carnes, MD or one of the following Advanced Practice Providers on your designated Care Team:    Richardson Dopp, PA-C  [place], [place]    Other Instructions Clonidine 0.1 mg --- if you get TWO blood pressure readings in a row (couple hours apart) of >180/  --take one half tablet.

## 2020-02-09 NOTE — Progress Notes (Signed)
Cardiology Office Note   Date:  02/09/2020   ID:  Carly [name] 28-Jan-1919, MRN [id number]  PCP:  Celene Squibb, MD  Cardiologist:   Dorris Carnes, MD    F/u of CAD     History of Present Illness: Carly [name] is a 86 y.o. female with a history ofCAD (s/p PTCA/Stent to Carly LAD and PTCA to ramus in 2003). Sestamibi was negative fir ischemia Also a hsitory of HTN and HL   I saw Carly [name] in March 2021 Carly [name] denies dizziness   She denies CP  Breathing is OK     Current Meds  Medication Sig  . acetaminophen (TYLENOL) 500 MG tablet Take 500 mg by mouth every 6 (six) hours as needed for mild pain.  Marland Kitchen amLODipine-benazepril (LOTREL) 5-20 MG capsule TAKE (1) CAPSULE BY MOUTH ONCE DAILY.  Marland Kitchen aspirin 81 MG tablet Take 81 mg by mouth daily.    . Bioflavonoid Products (VITAMIN C) CHEW Chew 1 each by mouth as directed.  . metoprolol succinate (TOPROL-XL) 50 MG 24 hr tablet Take 1 tablet (50 mg total) by mouth daily. Take with or immediately following a meal.  . nitroGLYCERIN (NITROSTAT) 0.4 MG SL tablet Place 1 tablet (0.4 mg total) under Carly tongue every 5 (five) minutes as needed for chest pain.  . raloxifene (EVISTA) 60 MG tablet Take 60 mg by mouth daily.    . simvastatin (ZOCOR) 10 MG tablet Take 1 tablet (10 mg total) by mouth daily at 6 PM.  . [DISCONTINUED] amLODipine-benazepril (LOTREL) 5-20 MG capsule TAKE (1) CAPSULE BY MOUTH ONCE DAILY.  . [DISCONTINUED] metoprolol succinate (TOPROL-XL) 50 MG 24 hr tablet TAKE 1 TABLET BY MOUTH ONCE DAILY.  . [DISCONTINUED] nitroGLYCERIN (NITROSTAT) 0.4 MG SL tablet Place 1 tablet (0.4 mg total) under Carly tongue every 5 (five) minutes as needed for chest pain.  . [DISCONTINUED] simvastatin (ZOCOR) 10 MG tablet TAKE (1) TABLET BY MOUTH AT BEDTIME.     Allergies:   [name] has no known allergies.   Past Medical History:  Diagnosis Date  . CAD (coronary artery disease)    a. s/p PTCA/stent to LAD and PTCA of ramus in 2003. b. normal nuc  10/2012 with EF 87%  . Carotid arterial disease ([place])    a. 07/2014 - duplex 27-03% RICA, 5-00% LICA.  Marland Kitchen CKD (chronic kidney disease), stage III ([place])   . Cystocele   . Dyslipidemia   . Gout   . HTN (hypertension)   . Transitional cell carcinoma ([place])    a. transitional cell carcinoma of Carly right pelvic ureter s/p right nephroureterectomy 08/2005.  . Vaginal wall prolapse     Past Surgical History:  Procedure Laterality Date  . APPENDECTOMY    . CORONARY ANGIOPLASTY WITH STENT PLACEMENT    . NEPHRECTOMY     right     Social History:  Carly [name]  reports that she has never smoked. She has never used smokeless tobacco. She reports that she does not drink alcohol and does not use drugs.   Family History:  Carly [name]'s family history includes Cancer in her unknown relative; Diabetes in her father; Heart attack in her father; Heart disease in her unknown relative.    ROS:  Please see Carly history of present illness. All other systems are reviewed and  Negative to Carly above problem except as noted.    PHYSICAL EXAM: VS:  BP 140/74   Pulse 77   Ht 5' 1.5" (  1.562 m)   Wt 118 lb 12.8 oz (53.9 kg)   SpO2 98%   BMI 22.08 kg/m   GEN: Thin 86 yo  in no acute distress  HEENT: normal  Neck: JVP is not elevtated  Cardiac: RRR; no murmurs Trivial LE edema  Respiratory:  clear to auscultation bilaterally,  GI: soft, nontender, nondistended, + BS  No hepatomegaly  MS: no deformity Moving all extremities   Skin: warm and dry, no rasht   EKG:  EKG is not ordered today    Lipid Panel    Component Value Date/Time   CHOL 156 05/20/2015 1223   TRIG 131 05/20/2015 1223   HDL 61 05/20/2015 1223   CHOLHDL 2.6 05/20/2015 1223   VLDL 26 05/20/2015 1223   LDLCALC 69 05/20/2015 1223   LDLDIRECT 72.6 11/10/2013 0949      Wt Readings from Last 3 Encounters:  02/09/20 118 lb 12.8 oz (53.9 kg)  06/26/19 115 lb 3.2 oz (52.3 kg)  11/11/18 111 lb (50.3 kg)      ASSESSMENT AND  PLAN:  1  CAD [name] remains free of angina  Follow     2  HTN   BP is OK  Keep on same meds  3    HL   Keep on statin  Get lipids today   CHeck CBC, BMET, TSH and lipids  F/U in July   Current medicines are reviewed at length with Carly [name] today.  Carly [name] does not have concerns regarding medicines.  Signed, Dorris Carnes, MD  02/09/2020 8:18 PM    [facility], [place], [place]  [address] Phone: [phone]; Fax: [phone]

## 2020-02-10 LAB — BASIC METABOLIC PANEL
BUN/Creatinine Ratio: 18 (ref 12–28)
BUN: 39 mg/dL — ABNORMAL HIGH (ref 10–36)
CO2: 17 mmol/L — ABNORMAL LOW (ref 20–29)
Calcium: 8.6 mg/dL — ABNORMAL LOW (ref 8.7–10.3)
Chloride: 109 mmol/L — ABNORMAL HIGH (ref 96–106)
Creatinine, Ser: 2.16 mg/dL — ABNORMAL HIGH (ref 0.57–1.00)
GFR calc Af Amer: 21 mL/min/{1.73_m2} — ABNORMAL LOW (ref >59)
GFR calc non Af Amer: 18 mL/min/{1.73_m2} — ABNORMAL LOW (ref >59)
Glucose: 114 mg/dL — ABNORMAL HIGH (ref 65–99)
Potassium: 4.9 mmol/L (ref 3.5–5.2)
Sodium: 141 mmol/L (ref 134–144)

## 2020-02-10 LAB — LIPID PANEL
Chol/HDL Ratio: 2.2 ratio (ref 0.0–4.4)
Cholesterol, Total: 160 mg/dL (ref 100–199)
HDL: 72 mg/dL (ref >39)
LDL Chol Calc (NIH): 68 mg/dL (ref 0–99)
Triglycerides: 111 mg/dL (ref 0–149)
VLDL Cholesterol Cal: 20 mg/dL (ref 5–40)

## 2020-02-10 LAB — CBC
Hematocrit: 36.5 % (ref 34.0–46.6)
Hemoglobin: 12.1 g/dL (ref 11.1–15.9)
MCH: 30.9 pg (ref 26.6–33.0)
MCHC: 33.2 g/dL (ref 31.5–35.7)
MCV: 93 fL (ref 79–97)
Platelets: 185 10*3/uL (ref 150–450)
RBC: 3.91 x10E6/uL (ref 3.77–5.28)
RDW: 13 % (ref 11.7–15.4)
WBC: 10.7 10*3/uL (ref 3.4–10.8)

## 2020-02-10 LAB — TSH: TSH: 3.5 u[IU]/mL (ref 0.450–4.500)

## 2020-02-12 ENCOUNTER — Telehealth: Payer: Self-pay | Admitting: *Deleted

## 2020-02-12 DIAGNOSIS — I10 Essential (primary) hypertension: Secondary | ICD-10-CM

## 2020-02-12 NOTE — Telephone Encounter (Signed)
-----   Message from Dorris Carnes V, MD sent at 02/11/2020  9:16 PM EDT ----- CBC is normal Thyroid function is normal Lipids are excellent Cr is a little higher 2.16 I would switch to 5 amlodipine and 10 mg benazapril    Repeat BMET next Thursday

## 2020-02-13 ENCOUNTER — Telehealth: Payer: Self-pay | Admitting: Internal Medicine

## 2020-02-13 MED ORDER — AMLODIPINE BESYLATE 5 MG PO TABS: 5.0000 mg | ORAL_TABLET | Freq: Every day | ORAL | 3 refills | Status: DC

## 2020-02-13 NOTE — Telephone Encounter (Signed)
Reviewed with Melissa, RPH. She recommends stopping benazepril and keeping amlodipine 5 mg or increasing it to 10 mg daily. Will route to Dr. Harrington Challenger for further review.

## 2020-02-13 NOTE — Telephone Encounter (Signed)
I spoke w patient's daughter and let her know to start amlodipine 5 mg only.  New prescription sent.  She will monitor BP closely.  She really hopes to not mess up BP management but understands that it is important to protect kidney function.   The patient has only one kidney.  She will write in on MyChart w BP readings and repeat labs next Thursday at [place], [place].

## 2020-02-13 NOTE — Telephone Encounter (Signed)
*  STAT* If patient is at the pharmacy, call can be transferred to refill team.   1. Which medications need to be refilled? (please list name of each medication and dose if known) new prescription for Amlodipine  5/1  2. Which pharmacy/location (including street and city if local pharmacy) is medication to be sent to? [facility] [place]  3. Do they need a 30 day or 90 day supply? #10- to see if pt will continue on this dose

## 2020-02-13 NOTE — Telephone Encounter (Signed)
Keep on amlodipine 5   Follow BP for now

## 2020-02-13 NOTE — Telephone Encounter (Signed)
Pt is requesting a refill on amlodipine-benazepril. This medication was D/C and was supposed to be decreased per Dr. Harrington Challenger, but was not reordered. Please address

## 2020-02-14 DIAGNOSIS — Z23 Encounter for immunization: Secondary | ICD-10-CM | POA: Diagnosis not present

## 2020-02-19 DIAGNOSIS — I10 Essential (primary) hypertension: Secondary | ICD-10-CM | POA: Diagnosis not present

## 2020-02-20 ENCOUNTER — Other Ambulatory Visit: Payer: Self-pay | Admitting: *Deleted

## 2020-02-20 DIAGNOSIS — I10 Essential (primary) hypertension: Secondary | ICD-10-CM

## 2020-02-20 LAB — BASIC METABOLIC PANEL
BUN/Creatinine Ratio: 23 (ref 12–28)
BUN: 51 mg/dL — ABNORMAL HIGH (ref 10–36)
CO2: 18 mmol/L — ABNORMAL LOW (ref 20–29)
Calcium: 9.1 mg/dL (ref 8.7–10.3)
Chloride: 108 mmol/L — ABNORMAL HIGH (ref 96–106)
Creatinine, Ser: 2.22 mg/dL — ABNORMAL HIGH (ref 0.57–1.00)
GFR calc Af Amer: 21 mL/min/{1.73_m2} — ABNORMAL LOW (ref >59)
GFR calc non Af Amer: 18 mL/min/{1.73_m2} — ABNORMAL LOW (ref >59)
Glucose: 129 mg/dL — ABNORMAL HIGH (ref 65–99)
Potassium: 5.1 mmol/L (ref 3.5–5.2)
Sodium: 141 mmol/L (ref 134–144)

## 2020-02-20 NOTE — Progress Notes (Signed)
Order for BMET per lab results/notes from Dr. Harrington Challenger.

## 2020-02-23 ENCOUNTER — Telehealth: Payer: Self-pay | Admitting: Internal Medicine

## 2020-02-23 DIAGNOSIS — E782 Mixed hyperlipidemia: Secondary | ICD-10-CM

## 2020-02-23 DIAGNOSIS — I2510 Atherosclerotic heart disease of native coronary artery without angina pectoris: Secondary | ICD-10-CM

## 2020-02-23 DIAGNOSIS — R300 Dysuria: Secondary | ICD-10-CM | POA: Diagnosis not present

## 2020-02-23 DIAGNOSIS — I10 Essential (primary) hypertension: Secondary | ICD-10-CM

## 2020-02-23 DIAGNOSIS — R197 Diarrhea, unspecified: Secondary | ICD-10-CM | POA: Diagnosis not present

## 2020-02-23 DIAGNOSIS — Z79899 Other long term (current) drug therapy: Secondary | ICD-10-CM

## 2020-02-23 NOTE — Telephone Encounter (Signed)
Althea is calling stating she was advised to call in with the pt's BP readings and give them to the nurse. She states she does not currently have them with her, but will have them when the nurse calls back. She also states Saturday and Sunday Rylan was not feeling well. Althea immeditaly thought it may have been UTI so she took a urine sample to the Doctor and is waiting for results.

## 2020-03-02 DIAGNOSIS — I10 Essential (primary) hypertension: Secondary | ICD-10-CM | POA: Diagnosis not present

## 2020-03-03 LAB — BASIC METABOLIC PANEL
BUN/Creatinine Ratio: 18 (ref 12–28)
BUN: 36 mg/dL (ref 10–36)
CO2: 17 mmol/L — ABNORMAL LOW (ref 20–29)
Calcium: 9.2 mg/dL (ref 8.7–10.3)
Chloride: 105 mmol/L (ref 96–106)
Creatinine, Ser: 1.95 mg/dL — ABNORMAL HIGH (ref 0.57–1.00)
GFR calc Af Amer: 24 mL/min/{1.73_m2} — ABNORMAL LOW (ref >59)
GFR calc non Af Amer: 21 mL/min/{1.73_m2} — ABNORMAL LOW (ref >59)
Glucose: 109 mg/dL — ABNORMAL HIGH (ref 65–99)
Potassium: 5 mmol/L (ref 3.5–5.2)
Sodium: 140 mmol/L (ref 134–144)

## 2020-03-08 NOTE — Telephone Encounter (Signed)
Follow Up Message  Carly [name] is calling back due to never receiving a callback in regards to this. Please advise.

## 2020-03-08 NOTE — Telephone Encounter (Signed)
BP readings after medication adjustment which was done because of change in renal function. Takes Toprol XL 50 daily at noon and amlodipine around 9 am.  Will call daughter back after Dr. Harrington Challenger reviews with any medication changes.  Daughter aware of most recent lab results. 11/13: 12:45 pm  121/97 11/13 6 pm  125/68 11/14 9:30 am 164/76 11/15 9:30 am 141/73 11/15 7 pm  160/75 11/16 9:30 am 153/71 11/16 1:04 pm 156/78 11/16  6 pm  161/90 11/17  9:30 am  153/82 11/17  1 pm  152/78 11/18 8:13 am 156/83 11/19 9:30 am  139/82 11/20 9:00 (before med) 179/74 11/20  1 pm  164/76 11/21 8:26 am 162/84 11/21  1 pm  174/90 11/22 8:00   155/76 11/22 1pm  158/82 11/23 12:30  128/74 11/24 7:51 am  152/84 11/25 8:15 am 141/77 11/25  5 pm  178/86 11/26 9:30am  168/72 11/26 1 pm  157/72 11/27 9:45 am 152/81 11/28 1:25  140/70 11/29 9:30 am 184/89

## 2020-03-09 MED ORDER — AMLODIPINE BESYLATE 5 MG PO TABS: 7.5000 mg | ORAL_TABLET | Freq: Every day | ORAL | 3 refills | Status: DC

## 2020-03-09 NOTE — Telephone Encounter (Signed)
Per Dr. Harrington Challenger' recommendations... new RX sent to profile at the Pharmacy for the Amlodipine and order placed for [facility] in [place] for labs tomorrow.

## 2020-03-09 NOTE — Telephone Encounter (Signed)
SPoke to daughter. I recomm she have her mom take 1.5 tabs amlodipine (7.5 mg)  Continue toprol XL Told her to keep recording and forwarding BP readings She should have a BMET through the [facility]  office tomorrow (Tuesday)   Check BNP as well

## 2020-03-10 DIAGNOSIS — I10 Essential (primary) hypertension: Secondary | ICD-10-CM | POA: Diagnosis not present

## 2020-03-10 DIAGNOSIS — E782 Mixed hyperlipidemia: Secondary | ICD-10-CM | POA: Diagnosis not present

## 2020-03-10 DIAGNOSIS — I2510 Atherosclerotic heart disease of native coronary artery without angina pectoris: Secondary | ICD-10-CM | POA: Diagnosis not present

## 2020-03-10 DIAGNOSIS — Z79899 Other long term (current) drug therapy: Secondary | ICD-10-CM | POA: Diagnosis not present

## 2020-03-10 MED ORDER — AMLODIPINE BESYLATE 5 MG PO TABS: 5.0000 mg | ORAL_TABLET | Freq: Every day | ORAL | 3 refills | Status: DC

## 2020-03-10 MED ORDER — AMLODIPINE BESYLATE 2.5 MG PO TABS: 2.5000 mg | ORAL_TABLET | Freq: Every day | ORAL | 3 refills | Status: DC

## 2020-03-10 NOTE — Telephone Encounter (Signed)
Sent separate amlodipine prescription to pharmacy for 2.5 mg.  She can take this and 5 mg together for the recommended dose of 7.5 mg daily.

## 2020-03-11 LAB — BASIC METABOLIC PANEL
BUN/Creatinine Ratio: 17 (ref 12–28)
BUN: 39 mg/dL — ABNORMAL HIGH (ref 10–36)
CO2: 18 mmol/L — ABNORMAL LOW (ref 20–29)
Calcium: 9.3 mg/dL (ref 8.7–10.3)
Chloride: 109 mmol/L — ABNORMAL HIGH (ref 96–106)
Creatinine, Ser: 2.23 mg/dL — ABNORMAL HIGH (ref 0.57–1.00)
GFR calc Af Amer: 20 mL/min/{1.73_m2} — ABNORMAL LOW (ref >59)
GFR calc non Af Amer: 18 mL/min/{1.73_m2} — ABNORMAL LOW (ref >59)
Glucose: 104 mg/dL — ABNORMAL HIGH (ref 65–99)
Potassium: 5.3 mmol/L — ABNORMAL HIGH (ref 3.5–5.2)
Sodium: 143 mmol/L (ref 134–144)

## 2020-03-11 LAB — PRO B NATRIURETIC PEPTIDE: NT-Pro BNP: 1635 pg/mL — ABNORMAL HIGH (ref 0–738)

## 2020-03-17 ENCOUNTER — Telehealth: Payer: Self-pay | Admitting: *Deleted

## 2020-03-17 DIAGNOSIS — I10 Essential (primary) hypertension: Secondary | ICD-10-CM

## 2020-03-17 DIAGNOSIS — R609 Edema, unspecified: Secondary | ICD-10-CM

## 2020-03-17 MED ORDER — METOPROLOL SUCCINATE ER 50 MG PO TB24: ORAL_TABLET | ORAL | 3 refills | Status: DC

## 2020-03-17 MED ORDER — AMLODIPINE BESYLATE 5 MG PO TABS: 5.0000 mg | ORAL_TABLET | Freq: Every day | ORAL | 3 refills | Status: DC

## 2020-03-17 NOTE — Telephone Encounter (Signed)
-----   Message from [name], MD sent at 03/12/2020  5:30 PM EST ----- Spoke with daughter REviewed labs  Recomm;  Watch salt intake    Get BMET and BNP in [place] week after next Go back to 5 mg amlodipine    (had been on this until today)  Increase toprol XL to 50 mid day and 25 later PM   Follow BP and HR  Trying to avoid BP in 170s

## 2020-03-17 NOTE — Telephone Encounter (Signed)
Lab orders placed for [facility] in [place] and released. Medication list updated.

## 2020-04-12 DIAGNOSIS — I10 Essential (primary) hypertension: Secondary | ICD-10-CM

## 2020-04-13 NOTE — Telephone Encounter (Signed)
Called patient's daughter to receive blood pressures.  Since most recent med change to amlodipine 5 mg and metoprolol 50 am and 25 pm  12/9 156/72  159/78  141/81 12/10 156/84  166/77 12/11 148/79 12/12 153/82  153/78 12/13 151/78  154/85 12/14 156/83  137/75 12/15 154/75  148/88 12/16 156/78  140/85 12/17 118/71  160/86 12/18 140/68 12/19 152/75  148/71 12/20 154/67 12/21 178/80   12/22 147/82   12/23 148/86 12/24 144/74  163/76 12/25 169/84 12/26 156/78 12/27 158/77 12/28 188/86    166/87 (fifteen min apart)  150/81 12/29 153/70  135/63 12/30 156/73  144/74 12/31 152/82  1/1 175/78  153/86 1/2 140/56 1/3 164/88  163/91 1/4 185/78     164/ few min later  Heart rates have been ranging between 83-74  Pt's daughter aware we are sending to Dr. Harrington Challenger to review and will call if there are new recommendations.

## 2020-04-13 NOTE — Telephone Encounter (Signed)
I would increase amlodipine to 5 bid  (how are ankles) and metoprolol to 50 bid   Keep track of BP and HR Should have BMET at convenience

## 2020-04-14 MED ORDER — METOPROLOL SUCCINATE ER 50 MG PO TB24: 50.0000 mg | ORAL_TABLET | Freq: Two times a day (BID) | ORAL | 3 refills | Status: DC

## 2020-04-14 MED ORDER — AMLODIPINE BESYLATE 5 MG PO TABS: 5.0000 mg | ORAL_TABLET | Freq: Two times a day (BID) | ORAL | 3 refills | Status: DC

## 2020-04-14 NOTE — Telephone Encounter (Signed)
Spoke with patient's daughter.  She verbalizes understanding of med changes.  Pt has appointment with Dr. [place] Crane on 1/17.  Will get lab work that day.  Prescription mailed to her home to take w her so results can be faxed to Dr. Harrington Challenger. Also put order in Epic and released in case they decide to go to [facility] in [place] as they usually do.  Will continue to monitor BPs/HR.

## 2020-04-15 NOTE — Telephone Encounter (Signed)
Spoke to daughter    She says her mom is anxious re changes in meds  Will work to set up a virtual visit on Monday

## 2020-04-19 ENCOUNTER — Telehealth (INDEPENDENT_AMBULATORY_CARE_PROVIDER_SITE_OTHER): Payer: Medicare Other | Admitting: Internal Medicine

## 2020-04-19 ENCOUNTER — Other Ambulatory Visit: Payer: Self-pay

## 2020-04-19 ENCOUNTER — Encounter: Payer: Self-pay | Admitting: Internal Medicine

## 2020-04-19 DIAGNOSIS — I10 Essential (primary) hypertension: Secondary | ICD-10-CM | POA: Diagnosis not present

## 2020-04-19 MED ORDER — METOPROLOL SUCCINATE ER 50 MG PO TB24: 50.0000 mg | ORAL_TABLET | Freq: Two times a day (BID) | ORAL | 3 refills | Status: DC

## 2020-04-19 NOTE — Progress Notes (Signed)
Virtual Visit via Telephone Note   This visit type was conducted due to national recommendations for restrictions regarding the COVID-19 Pandemic (e.g. social distancing) in an effort to limit this patient's exposure and mitigate transmission in our community.  Due to her co-morbid illnesses, this patient is at least at moderate risk for complications without adequate follow up.  This format is felt to be most appropriate for this patient at this time.  The patient did not have access to video technology/had technical difficulties with video requiring transitioning to audio format only (telephone).  All issues noted in this document were discussed and addressed.  No physical exam could be performed with this format.  Please refer to the patient's chart for her  consent to telehealth for [facility].    Date:  04/19/2020   ID:  Carly [name], DOB [birth year], MRN [id number] The patient was identified using 2 identifiers.  Patient Location: Home Provider Location: Office/Clinic  PCP:  Celene Squibb, MD  Cardiologist:  Dorris Carnes, MD  Electrophysiologist:  None   Evaluation Performed:  Follow-Up Visit  Chief Complaint:  Televisit to f/u HTN   History of Present Illness:     Carly [name] is a 87 y.o. female with a history ofCAD (s/p PTCA/Stent to the LAD and PTCA to ramus in 2003). Sestamibi was negative fir ischemia Also a hsitory of HTN and HL   I saw the pt earlier this fall   Labs drawn   Showed bump in Cr   I recomm stopping benazepril and continuing amlodipine  Pt also on Toprol XL   These doses have been adjusted several times over phone   Today the pt and her daughter are on the phoe   Her BP is in the 160s     The pt says she never feels really good   Breathing is stable   No CP   The patient does not have symptoms concerning for COVID-19 infection (fever, chills, cough, or new shortness of breath).    Past Medical History:  Diagnosis Date  . CAD (coronary artery  disease)    a. s/p PTCA/stent to LAD and PTCA of ramus in 2003. b. normal nuc 10/2012 with EF 87%  . Carotid arterial disease ([place])    a. 07/2014 - duplex 23-55% RICA, 7-32% LICA.  Marland Kitchen CKD (chronic kidney disease), stage III ([place])   . Cystocele   . Dyslipidemia   . Gout   . HTN (hypertension)   . Transitional cell carcinoma ([place])    a. 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  . Vaginal wall prolapse    Past Surgical History:  Procedure Laterality Date  . APPENDECTOMY    . CORONARY ANGIOPLASTY WITH STENT PLACEMENT    . NEPHRECTOMY     right     Current Meds  Medication Sig  . acetaminophen (TYLENOL) 500 MG tablet Take 500 mg by mouth every 6 (six) hours as needed for mild pain.  Marland Kitchen amLODipine (NORVASC) 5 MG tablet Take 1 tablet (5 mg total) by mouth in the morning and at bedtime.  Marland Kitchen aspirin 81 MG tablet Take 81 mg by mouth daily.  . Bioflavonoid Products (VITAMIN C) CHEW Chew 1 each by mouth as directed.  . cloNIDine (CATAPRES) 0.1 MG tablet Take by mouth daily. Take 1/2 tab as needed  . metoprolol succinate (TOPROL-XL) 50 MG 24 hr tablet Take 1 tablet (50 mg total) by mouth 2 (two)  times daily. Take with or immediately following a meal.  . nitroGLYCERIN (NITROSTAT) 0.4 MG SL tablet Place 1 tablet (0.4 mg total) under the tongue every 5 (five) minutes as needed for chest pain.  . raloxifene (EVISTA) 60 MG tablet Take 60 mg by mouth daily.  . simvastatin (ZOCOR) 10 MG tablet Take 1 tablet (10 mg total) by mouth daily at 6 PM.  . [DISCONTINUED] cloNIDine (CATAPRES) 0.1 MG tablet Take half tablet as directed. (Patient taking differently: as needed. Take half tablet as directed.)  . [DISCONTINUED] metoprolol succinate (TOPROL-XL) 50 MG 24 hr tablet Take ONE tablet mid day and HALF tablet in PM. (Patient taking differently: Take by mouth 2 (two) times daily.)     Allergies:   Patient has no known allergies.   Social History   Tobacco  Use  . Smoking status: Never Smoker  . Smokeless tobacco: Never Used  Vaping Use  . Vaping Use: Never used  Substance Use Topics  . Alcohol use: No  . Drug use: No     Family Hx: The patient's family history includes Cancer in her unknown relative; Diabetes in her father; Heart attack in her father; Heart disease in her unknown relative.  ROS:   Please see the history of present illness.     All other systems reviewed and are negative.   Prior CV studies:   The following studies were reviewed today:    Labs/Other Tests and Data Reviewed:    EKG:  No ECG reviewed.  Recent Labs: 02/09/2020: Hemoglobin 12.1; Platelets 185; TSH 3.500 03/10/2020: BUN 39; Creatinine, Ser 2.23; NT-Pro BNP 1,635; Potassium 5.3; Sodium 143   Recent Lipid Panel Lab Results  Component Value Date/Time   CHOL 160 02/09/2020 04:54 PM   TRIG 111 02/09/2020 04:54 PM   HDL 72 02/09/2020 04:54 PM   CHOLHDL 2.2 02/09/2020 04:54 PM   CHOLHDL 2.6 05/20/2015 12:23 PM   LDLCALC 68 02/09/2020 04:54 PM   LDLDIRECT 72.6 11/10/2013 09:49 AM    Wt Readings from Last 3 Encounters:  04/19/20 118 lb (53.5 kg)  02/09/20 118 lb 12.8 oz (53.9 kg)  06/26/19 115 lb 3.2 oz (52.3 kg)     Risk Assessment/Calculations:      Objective:    Vital Signs:  BP (!) 163/88   Pulse 68   Ht 5' (1.524 m)   Wt 118 lb (53.5 kg)   BMI 23.05 kg/m    VITAL SIGNS:  reviewed GEN:  No exam  ASSESSMENT & PLAN:    1. HTN   Difficult   I do not want to lower too abruptly   I had recomm clonidine to take as needed for high BP but they did not start it  Worried For now I would recomm increaseing amlodipine to 5 bid and also Toprol XL to 50 bid   Call in 1.5 wks with response    May need to consider low dose hydralazine though I worry it may be too mcuh  2   CAD  Remote intervention  No anginal symptoms  3  HL  Keep on statin  4  Renal  Pt is s/ R nephectomy   Last BMET 1 month ago Cr 2.23  BNP 1635   Will need to follow  closely      Time:   Today, I have spent 20 minutes with the patient with telehealth technology discussing the above problems.     Medication Adjustments/Labs and Tests Ordered: Current medicines are reviewed  at length with the patient today.  Concerns regarding medicines are outlined above.   Tests Ordered: No orders of the defined types were placed in this encounter.   Medication Changes: Meds ordered this encounter  Medications  . metoprolol succinate (TOPROL-XL) 50 MG 24 hr tablet    Sig: Take 1 tablet (50 mg total) by mouth 2 (two) times daily. Take with or immediately following a meal.    Dispense:  180 tablet    Refill:  3    Please keep on file until next refill needed.  Updated dose/directions only.    Follow Up:    Signed, Dorris Carnes, MD  04/19/2020 10:31 PM    [facility]   Cardiology Office Note   Date:  04/19/2020   ID:  Carly [name], Carly 1919-04-03, MRN [id number]  PCP:  Celene Squibb, MD  Cardiologist:   Dorris Carnes, MD    F/u of CAD     History of Present Illness: FUSAYE [name] is a 87 y.o. female with a history ofCAD (s/p PTCA/Stent to the LAD and PTCA to ramus in 2003). Sestamibi was negative fir ischemia Also a hsitory of HTN and HL   I saw the pt in in  Current Meds  Medication Sig  . acetaminophen (TYLENOL) 500 MG tablet Take 500 mg by mouth every 6 (six) hours as needed for mild pain.  Marland Kitchen amLODipine (NORVASC) 5 MG tablet Take 1 tablet (5 mg total) by mouth in the morning and at bedtime.  Marland Kitchen aspirin 81 MG tablet Take 81 mg by mouth daily.  . Bioflavonoid Products (VITAMIN C) CHEW Chew 1 each by mouth as directed.  . cloNIDine (CATAPRES) 0.1 MG tablet Take by mouth daily. Take 1/2 tab as needed  . metoprolol succinate (TOPROL-XL) 50 MG 24 hr tablet Take ONE tablet mid day and HALF tablet in PM. (Patient taking differently: 75 mg. Take ONE tablet mid day and HALF tablet in PM.)  . nitroGLYCERIN (NITROSTAT) 0.4 MG  SL tablet Place 1 tablet (0.4 mg total) under the tongue every 5 (five) minutes as needed for chest pain.  . raloxifene (EVISTA) 60 MG tablet Take 60 mg by mouth daily.  . simvastatin (ZOCOR) 10 MG tablet Take 1 tablet (10 mg total) by mouth daily at 6 PM.  . [DISCONTINUED] cloNIDine (CATAPRES) 0.1 MG tablet Take half tablet as directed. (Patient taking differently: as needed. Take half tablet as directed.)     Allergies:   Patient has no known allergies.   Past Medical History:  Diagnosis Date  . CAD (coronary artery disease)    a. s/p PTCA/stent to LAD and PTCA of ramus in 2003. b. normal nuc 10/2012 with EF 87%  . Carotid arterial disease ([place])    a. 07/2014 - duplex 40-08% RICA, 6-76% LICA.  Marland Kitchen CKD (chronic kidney disease), stage III ([place])   . Cystocele   . Dyslipidemia   . Gout   . HTN (hypertension)   . Transitional cell carcinoma ([place])    a. 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  . Vaginal wall prolapse     Past Surgical History:  Procedure Laterality Date  . APPENDECTOMY    . CORONARY ANGIOPLASTY WITH STENT PLACEMENT    . NEPHRECTOMY     right     Social History:  The patient  reports that she has never smoked. She has never used smokeless tobacco. She reports that she does  not drink alcohol and does not use drugs.   Family History:  The patient's family history includes Cancer in her unknown relative; Diabetes in her father; Heart attack in her father; Heart disease in her unknown relative.    ROS:  Please see the history of present illness. All other systems are reviewed and  Negative to the above problem except as noted.    PHYSICAL EXAM: VS:  BP (!) 163/88   Pulse 68   Ht 5' (1.524 m)   Wt 118 lb (53.5 kg)   BMI 23.05 kg/m   GEN: Thin 87 yo  in no acute distress  HEENT: normal  Neck: JVP is not elevtated  Cardiac: RRR; no murmurs Trivial LE edema  Respiratory:  clear to auscultation bilaterally,  GI:  soft, nontender, nondistended, + BS  No hepatomegaly  MS: no deformity Moving all extremities   Skin: warm and dry, no rasht   EKG:  EKG is not ordered today    Lipid Panel    Component Value Date/Time   CHOL 160 02/09/2020 1654   TRIG 111 02/09/2020 1654   HDL 72 02/09/2020 1654   CHOLHDL 2.2 02/09/2020 1654   CHOLHDL 2.6 05/20/2015 1223   VLDL 26 05/20/2015 1223   LDLCALC 68 02/09/2020 1654   LDLDIRECT 72.6 11/10/2013 0949      Wt Readings from Last 3 Encounters:  04/19/20 118 lb (53.5 kg)  02/09/20 118 lb 12.8 oz (53.9 kg)  06/26/19 115 lb 3.2 oz (52.3 kg)      ASSESSMENT AND PLAN:  1  CAD Pt remains free of angina  Follow     2  HTN   BP is OK  Keep on same meds  3    HL   Keep on statin  Get lipids today   CHeck CBC, BMET, TSH and lipids  F/U in July   Current medicines are reviewed at length with the patient today.  The patient does not have concerns regarding medicines.  Signed, Dorris Carnes, MD  04/19/2020 3:50 PM    [facility] [address], [place], [place]  [address] Phone: [phone]; Fax: [phone]

## 2020-04-19 NOTE — Patient Instructions (Signed)
Medication Instructions:  Your physician has recommended you make the following change in your medication:  1.) amlodipine 5 mg twice a day 2.) Toprol XL 50 mg twice a day   *If you need a refill on your cardiac medications before your next appointment, please call your pharmacy*   Lab Work: None   Testing/Procedures: none  Please send through MyChart updated blood pressures, or call and report them to our office.

## 2020-04-30 ENCOUNTER — Telehealth: Payer: Self-pay | Admitting: Internal Medicine

## 2020-04-30 DIAGNOSIS — I10 Essential (primary) hypertension: Secondary | ICD-10-CM

## 2020-04-30 DIAGNOSIS — R609 Edema, unspecified: Secondary | ICD-10-CM

## 2020-04-30 NOTE — Telephone Encounter (Signed)
Daghter called earlier    BP running 130 to 170s   Mostly in 140s Has more ankle swelling    Will get labs from [facility] on Monday (BMET, BNP) Told daughter to decrease amlodipine to 5 mg daily

## 2020-05-03 MED ORDER — AMLODIPINE BESYLATE 5 MG PO TABS: 5.0000 mg | ORAL_TABLET | Freq: Every day | ORAL | 3 refills | Status: DC

## 2020-05-03 NOTE — Telephone Encounter (Signed)
Lab orders entered and released so that pt can go to [facility]. Medication list updated.

## 2020-05-03 NOTE — Addendum Note (Signed)
Addended by: Rodman Key on: 05/03/2020 08:51 AM   Modules accepted: Orders

## 2020-05-04 DIAGNOSIS — R609 Edema, unspecified: Secondary | ICD-10-CM | POA: Diagnosis not present

## 2020-05-04 DIAGNOSIS — I10 Essential (primary) hypertension: Secondary | ICD-10-CM | POA: Diagnosis not present

## 2020-05-05 LAB — BASIC METABOLIC PANEL
BUN/Creatinine Ratio: 15 (ref 12–28)
BUN: 36 mg/dL (ref 10–36)
CO2: 18 mmol/L — ABNORMAL LOW (ref 20–29)
Calcium: 8.5 mg/dL — ABNORMAL LOW (ref 8.7–10.3)
Chloride: 108 mmol/L — ABNORMAL HIGH (ref 96–106)
Creatinine, Ser: 2.47 mg/dL — ABNORMAL HIGH (ref 0.57–1.00)
GFR calc Af Amer: 18 mL/min/{1.73_m2} — ABNORMAL LOW (ref >59)
GFR calc non Af Amer: 16 mL/min/{1.73_m2} — ABNORMAL LOW (ref >59)
Glucose: 165 mg/dL — ABNORMAL HIGH (ref 65–99)
Potassium: 4.5 mmol/L (ref 3.5–5.2)
Sodium: 141 mmol/L (ref 134–144)

## 2020-05-05 LAB — PRO B NATRIURETIC PEPTIDE: NT-Pro BNP: 2884 pg/mL — ABNORMAL HIGH (ref 0–738)

## 2020-05-11 ENCOUNTER — Telehealth: Payer: Self-pay | Admitting: Internal Medicine

## 2020-05-11 DIAGNOSIS — N183 Chronic kidney disease, stage 3 unspecified: Secondary | ICD-10-CM

## 2020-05-11 DIAGNOSIS — I10 Essential (primary) hypertension: Secondary | ICD-10-CM

## 2020-05-11 NOTE — Telephone Encounter (Signed)
Can refer pt to nephrology clinic    They have a clinic in [place]

## 2020-05-12 NOTE — Telephone Encounter (Signed)
Referral placed for [place], [place] location.  Informed the patient's daughter in [place] message reply.

## 2020-05-12 NOTE — Addendum Note (Signed)
Addended by: Rodman Key on: 05/12/2020 09:04 AM   Modules accepted: Orders

## 2020-05-25 ENCOUNTER — Telehealth: Payer: Self-pay | Admitting: Internal Medicine

## 2020-05-25 NOTE — Telephone Encounter (Signed)
Patient's daughter wrote in   BP is high I would recomm  1/  REpeat BMET at her convenience 2  Increae metorpol

## 2020-05-26 ENCOUNTER — Telehealth: Payer: Self-pay | Admitting: Internal Medicine

## 2020-05-26 DIAGNOSIS — Z79899 Other long term (current) drug therapy: Secondary | ICD-10-CM

## 2020-05-26 NOTE — Telephone Encounter (Signed)
Spoke to daughter   10 mg tablet   Cut 1/4  Take 1/4 bid to start  Close follow up of BP  Would recomm BMET and BNP   At [facility]  Call pt/daughter when orders in

## 2020-05-26 NOTE — Telephone Encounter (Signed)
BMET and BNP ordered per Dr. Harrington Challenger.  Called the patients daughter to let her know.  She states they will get labs drawn at [facility] in [place].

## 2020-05-26 NOTE — Addendum Note (Signed)
Addended by: Cheral Marker R on: 05/26/2020 10:48 AM   Modules accepted: Orders

## 2020-05-28 ENCOUNTER — Telehealth: Payer: Self-pay | Admitting: Internal Medicine

## 2020-05-28 DIAGNOSIS — I10 Essential (primary) hypertension: Secondary | ICD-10-CM | POA: Diagnosis not present

## 2020-05-28 MED ORDER — HYDRALAZINE HCL 10 MG PO TABS: ORAL_TABLET | ORAL | 5 refills | Status: DC

## 2020-05-28 NOTE — Telephone Encounter (Signed)
Hydralazine 10 mg bid Take 1/4 to 1/2 bid to start    Follow BP closely

## 2020-05-28 NOTE — Telephone Encounter (Signed)
    Pt's daughter following up pt's medication, she said there's a new medication Dr. Harrington Challenger prescribed that start with a letter H and it has not called in to the pharmacy yet

## 2020-05-28 NOTE — Telephone Encounter (Signed)
Hydralazine prescription sent to [facility].  My chart message sent to let patient/her daughter know.

## 2020-05-28 NOTE — Telephone Encounter (Signed)
See other telephone call encounter from Dr. Harrington Challenger dated today 05/28/20.

## 2020-05-28 NOTE — Addendum Note (Signed)
Addended by: Rodman Key on: 05/28/2020 12:39 PM   Modules accepted: Orders

## 2020-05-29 LAB — BASIC METABOLIC PANEL
BUN/Creatinine Ratio: 16 (ref 12–28)
BUN: 40 mg/dL — ABNORMAL HIGH (ref 10–36)
CO2: 16 mmol/L — ABNORMAL LOW (ref 20–29)
Calcium: 9 mg/dL (ref 8.7–10.3)
Chloride: 107 mmol/L — ABNORMAL HIGH (ref 96–106)
Creatinine, Ser: 2.51 mg/dL — ABNORMAL HIGH (ref 0.57–1.00)
GFR calc Af Amer: 18 mL/min/{1.73_m2} — ABNORMAL LOW (ref >59)
GFR calc non Af Amer: 15 mL/min/{1.73_m2} — ABNORMAL LOW (ref >59)
Glucose: 96 mg/dL (ref 65–99)
Potassium: 4.9 mmol/L (ref 3.5–5.2)
Sodium: 141 mmol/L (ref 134–144)

## 2020-06-11 MED ORDER — HYDRALAZINE HCL 10 MG PO TABS: 10.0000 mg | ORAL_TABLET | Freq: Every day | ORAL | 5 refills | Status: DC

## 2020-06-15 NOTE — Telephone Encounter (Signed)
Glad her BP is better   I would keep at 10 2x per day (BID) If she is willing get CBC, BNP and BMET at end of week so that Z [place] Crane can have when he sees her   Or she can wait until she sees him

## 2020-06-15 NOTE — Telephone Encounter (Signed)
Most recent med change/recommendation was to take hydralazine 10 mg daily.   She has been getting 10 mg BID.  See BPs in message from her daughter.

## 2020-06-17 MED ORDER — HYDRALAZINE HCL 10 MG PO TABS: 10.0000 mg | ORAL_TABLET | Freq: Two times a day (BID) | ORAL | 3 refills | Status: DC

## 2020-06-21 DIAGNOSIS — B029 Zoster without complications: Secondary | ICD-10-CM | POA: Diagnosis not present

## 2020-06-21 DIAGNOSIS — E46 Unspecified protein-calorie malnutrition: Secondary | ICD-10-CM | POA: Diagnosis not present

## 2020-06-21 DIAGNOSIS — R0789 Other chest pain: Secondary | ICD-10-CM | POA: Diagnosis not present

## 2020-06-21 DIAGNOSIS — M109 Gout, unspecified: Secondary | ICD-10-CM | POA: Diagnosis not present

## 2020-06-21 DIAGNOSIS — M810 Age-related osteoporosis without current pathological fracture: Secondary | ICD-10-CM | POA: Diagnosis not present

## 2020-06-21 DIAGNOSIS — R1312 Dysphagia, oropharyngeal phase: Secondary | ICD-10-CM | POA: Diagnosis not present

## 2020-06-21 DIAGNOSIS — E782 Mixed hyperlipidemia: Secondary | ICD-10-CM | POA: Diagnosis not present

## 2020-06-21 DIAGNOSIS — I10 Essential (primary) hypertension: Secondary | ICD-10-CM | POA: Diagnosis not present

## 2020-06-21 DIAGNOSIS — Z9181 History of falling: Secondary | ICD-10-CM | POA: Diagnosis not present

## 2020-06-21 DIAGNOSIS — R634 Abnormal weight loss: Secondary | ICD-10-CM | POA: Diagnosis not present

## 2020-06-21 DIAGNOSIS — F419 Anxiety disorder, unspecified: Secondary | ICD-10-CM | POA: Diagnosis not present

## 2020-06-21 DIAGNOSIS — I2510 Atherosclerotic heart disease of native coronary artery without angina pectoris: Secondary | ICD-10-CM | POA: Diagnosis not present

## 2020-07-07 DIAGNOSIS — I129 Hypertensive chronic kidney disease with stage 1 through stage 4 chronic kidney disease, or unspecified chronic kidney disease: Secondary | ICD-10-CM | POA: Diagnosis not present

## 2020-07-07 DIAGNOSIS — R600 Localized edema: Secondary | ICD-10-CM | POA: Diagnosis not present

## 2020-07-07 DIAGNOSIS — I10 Essential (primary) hypertension: Secondary | ICD-10-CM | POA: Diagnosis not present

## 2020-07-07 DIAGNOSIS — N184 Chronic kidney disease, stage 4 (severe): Secondary | ICD-10-CM | POA: Diagnosis not present

## 2020-07-07 DIAGNOSIS — E872 Acidosis: Secondary | ICD-10-CM | POA: Diagnosis not present

## 2020-07-07 DIAGNOSIS — G20 Parkinson's disease: Secondary | ICD-10-CM | POA: Diagnosis not present

## 2020-07-07 DIAGNOSIS — Z5181 Encounter for therapeutic drug level monitoring: Secondary | ICD-10-CM | POA: Diagnosis not present

## 2020-07-07 DIAGNOSIS — Z79899 Other long term (current) drug therapy: Secondary | ICD-10-CM | POA: Diagnosis not present

## 2020-07-08 ENCOUNTER — Other Ambulatory Visit: Payer: Self-pay | Admitting: Nephrology

## 2020-07-08 ENCOUNTER — Other Ambulatory Visit (HOSPITAL_COMMUNITY): Payer: Self-pay | Admitting: Nephrology

## 2020-07-08 DIAGNOSIS — N184 Chronic kidney disease, stage 4 (severe): Secondary | ICD-10-CM

## 2020-07-08 DIAGNOSIS — R809 Proteinuria, unspecified: Secondary | ICD-10-CM

## 2020-07-08 DIAGNOSIS — Z85528 Personal history of other malignant neoplasm of kidney: Secondary | ICD-10-CM

## 2020-07-16 ENCOUNTER — Ambulatory Visit (HOSPITAL_COMMUNITY)
Admission: RE | Admit: 2020-07-16 | Discharge: 2020-07-16 | Disposition: A | Discharge status: Home / Self Care | Payer: Medicare Other | Source: Ambulatory Visit | Attending: Nephrology | Admitting: Nephrology

## 2020-07-16 DIAGNOSIS — Z905 Acquired absence of kidney: Secondary | ICD-10-CM | POA: Diagnosis not present

## 2020-07-16 DIAGNOSIS — I129 Hypertensive chronic kidney disease with stage 1 through stage 4 chronic kidney disease, or unspecified chronic kidney disease: Secondary | ICD-10-CM | POA: Diagnosis not present

## 2020-07-16 DIAGNOSIS — Z85528 Personal history of other malignant neoplasm of kidney: Secondary | ICD-10-CM | POA: Diagnosis not present

## 2020-07-16 DIAGNOSIS — R809 Proteinuria, unspecified: Secondary | ICD-10-CM | POA: Diagnosis not present

## 2020-07-16 DIAGNOSIS — N189 Chronic kidney disease, unspecified: Secondary | ICD-10-CM | POA: Diagnosis not present

## 2020-07-16 DIAGNOSIS — Z79899 Other long term (current) drug therapy: Secondary | ICD-10-CM | POA: Diagnosis not present

## 2020-07-16 DIAGNOSIS — Z5181 Encounter for therapeutic drug level monitoring: Secondary | ICD-10-CM | POA: Diagnosis not present

## 2020-07-16 DIAGNOSIS — R600 Localized edema: Secondary | ICD-10-CM | POA: Diagnosis not present

## 2020-07-16 DIAGNOSIS — N184 Chronic kidney disease, stage 4 (severe): Secondary | ICD-10-CM | POA: Diagnosis not present

## 2020-07-16 DIAGNOSIS — E872 Acidosis: Secondary | ICD-10-CM | POA: Diagnosis not present

## 2020-07-22 DIAGNOSIS — E559 Vitamin D deficiency, unspecified: Secondary | ICD-10-CM | POA: Diagnosis not present

## 2020-07-22 DIAGNOSIS — I129 Hypertensive chronic kidney disease with stage 1 through stage 4 chronic kidney disease, or unspecified chronic kidney disease: Secondary | ICD-10-CM | POA: Diagnosis not present

## 2020-07-22 DIAGNOSIS — N185 Chronic kidney disease, stage 5: Secondary | ICD-10-CM | POA: Diagnosis not present

## 2020-07-22 DIAGNOSIS — R600 Localized edema: Secondary | ICD-10-CM | POA: Diagnosis not present

## 2020-07-22 DIAGNOSIS — R808 Other proteinuria: Secondary | ICD-10-CM | POA: Diagnosis not present

## 2020-07-22 DIAGNOSIS — E211 Secondary hyperparathyroidism, not elsewhere classified: Secondary | ICD-10-CM | POA: Diagnosis not present

## 2020-08-10 IMAGING — DX DG CHEST 2V
2 series · 2 of 2 positions shown · non-contrast
Comparison: PA and lateral chest 10/24/2014.  CT chest 11/03/2012.

CLINICAL DATA: Cough.

EXAM:
CHEST - 2 VIEW

[chest lat]
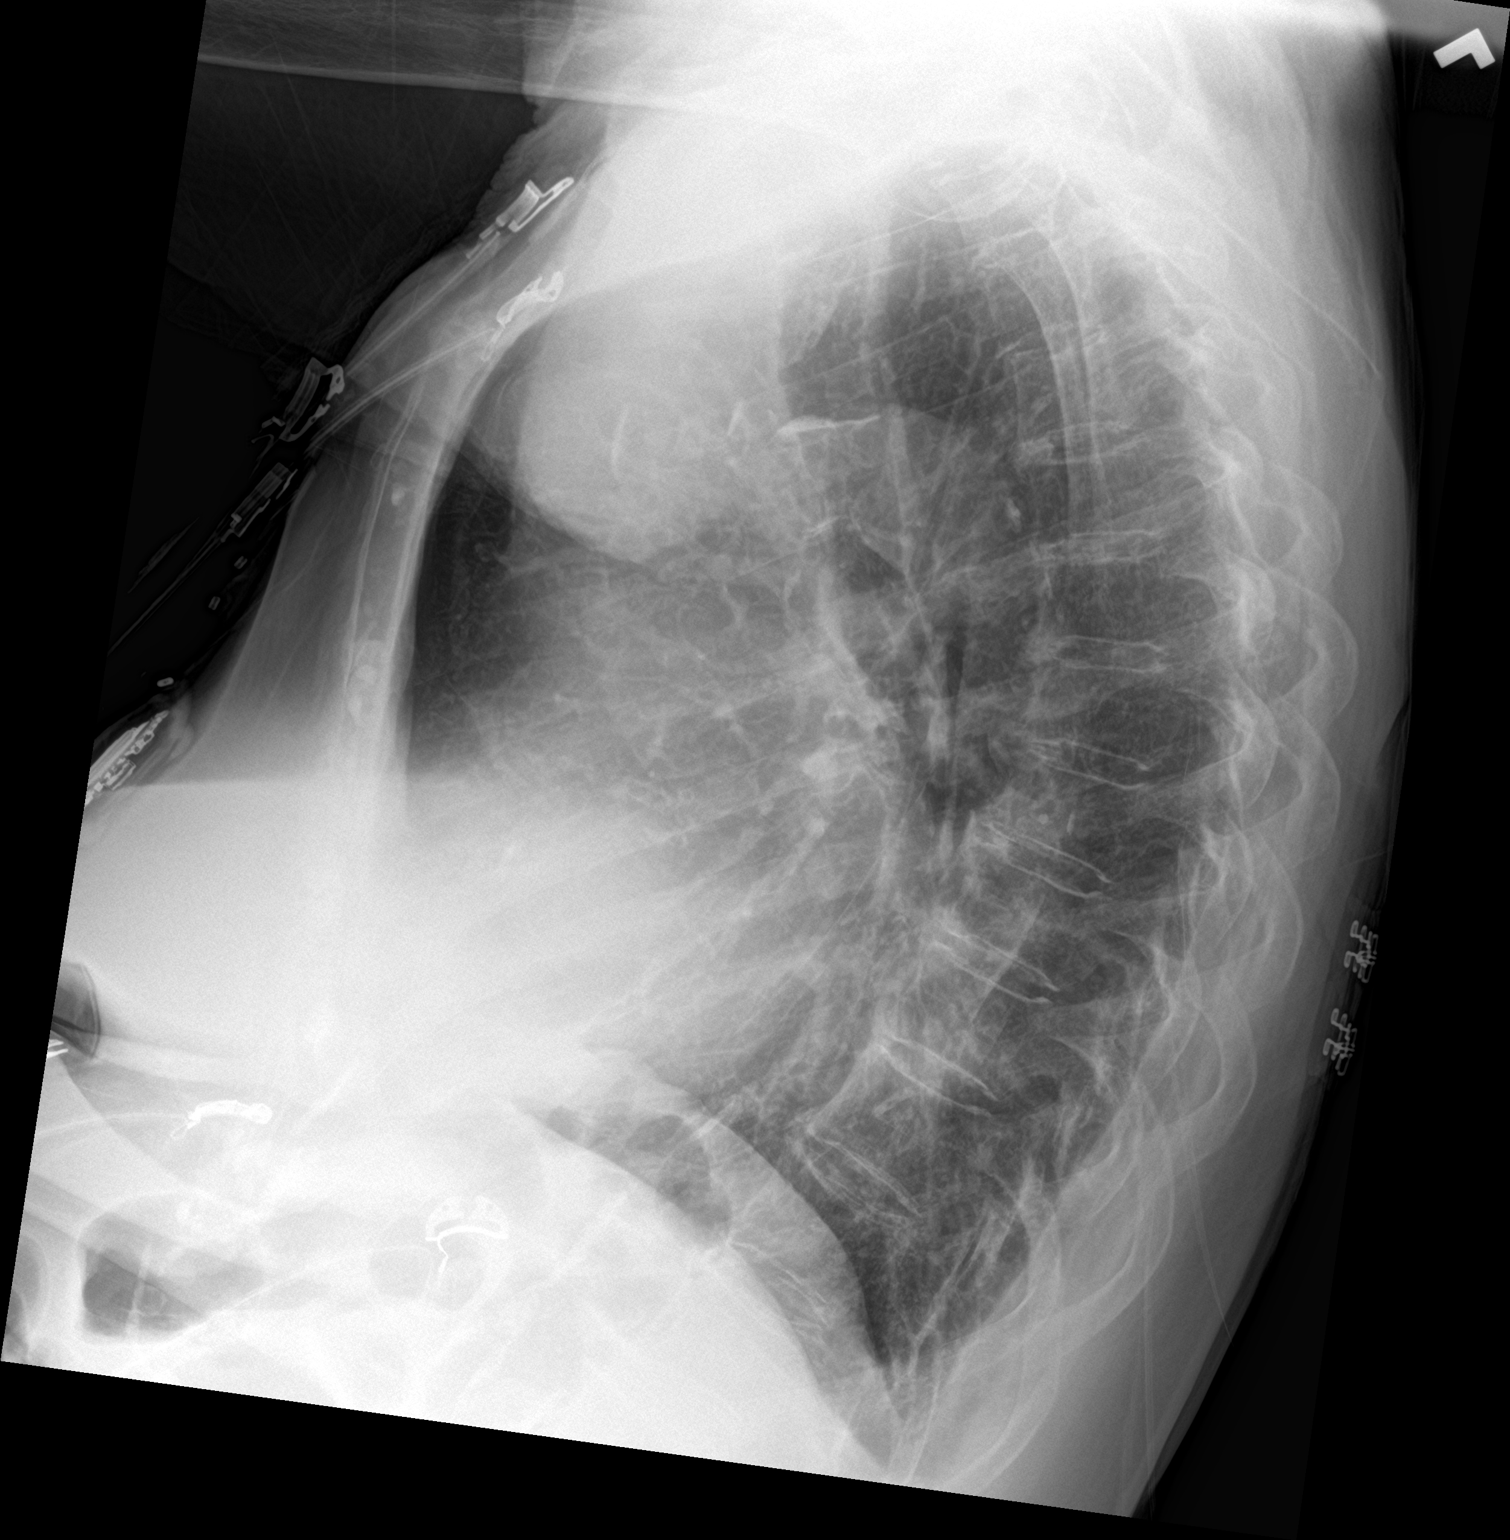

[chest ap]
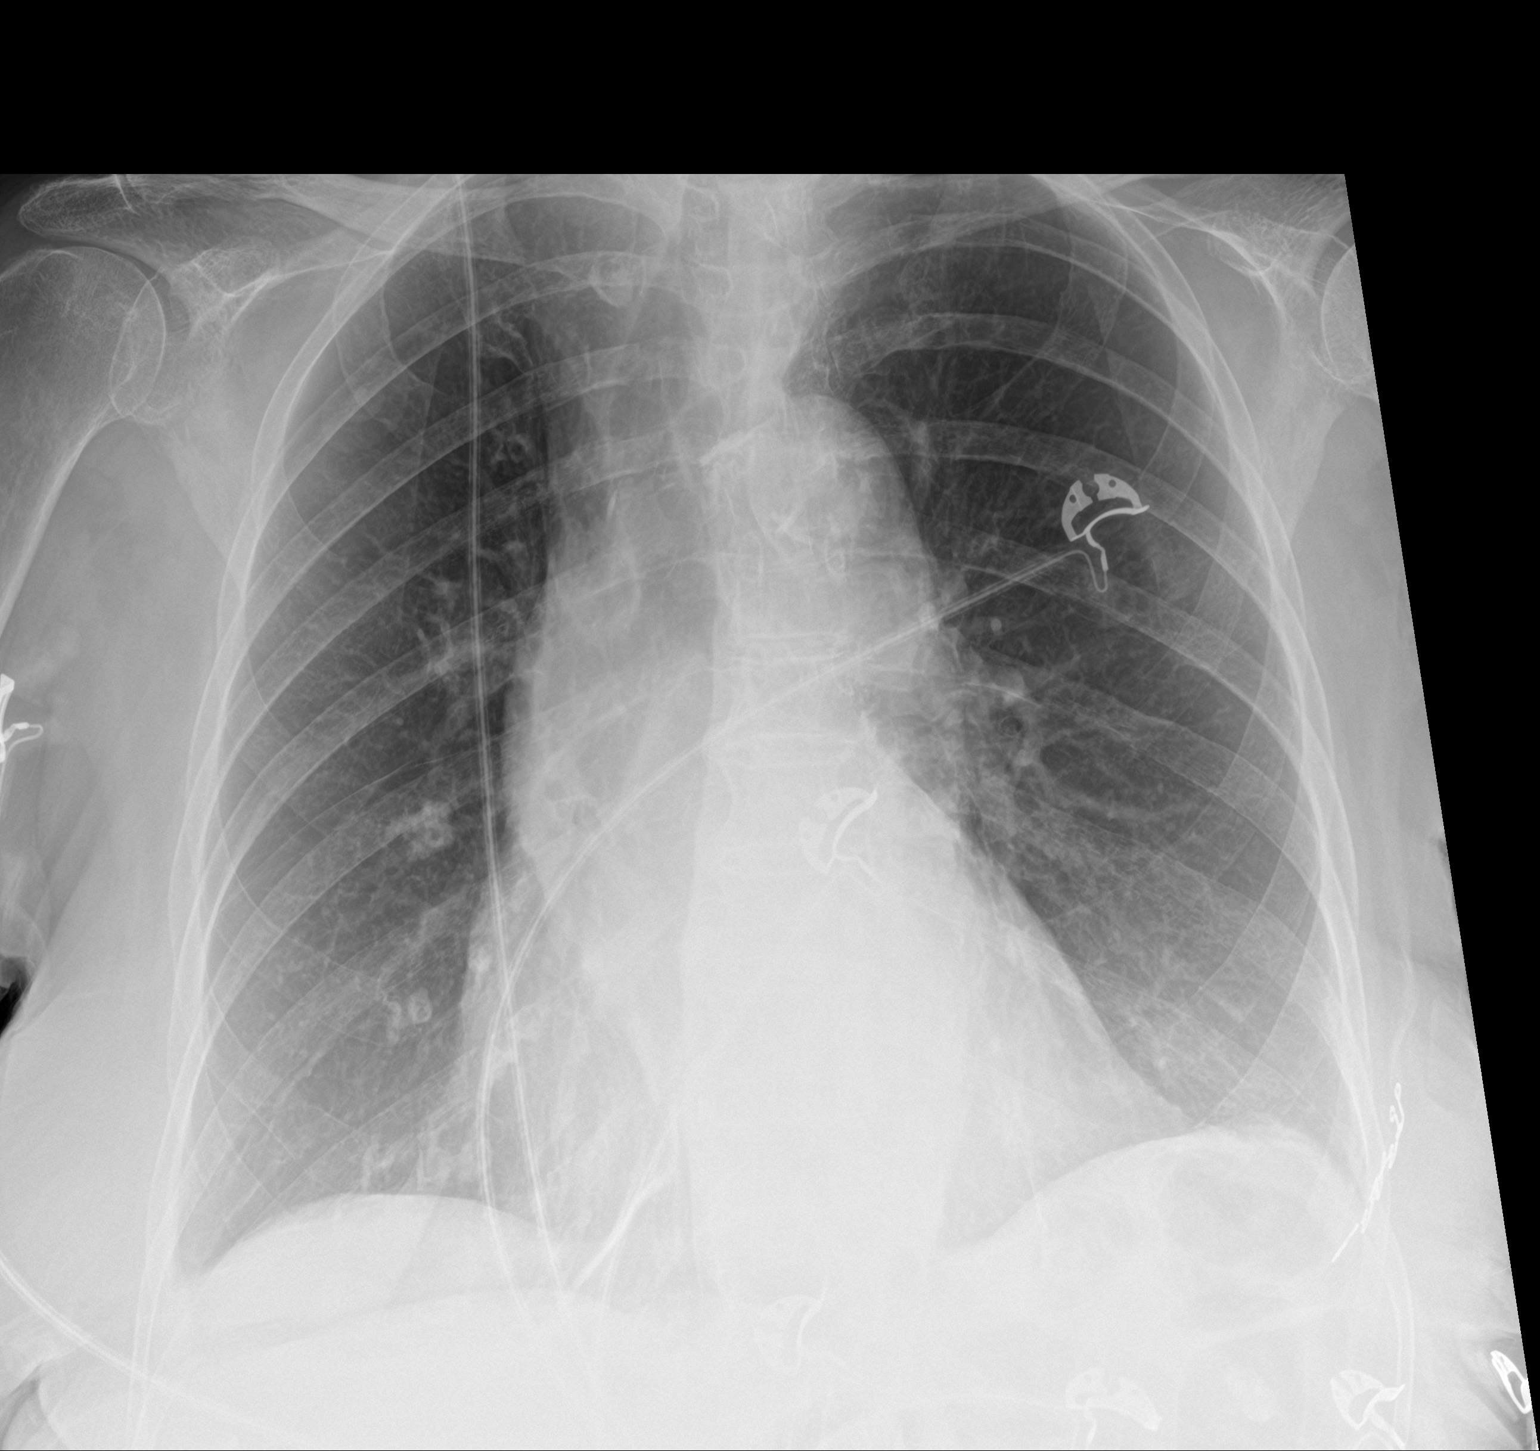

[2 of 2 positions shown; findings below may reference images not displayed]

FINDINGS: The lungs are clear. Heart size is normal. No pneumothorax or
pleural effusion. Aortic atherosclerosis is noted. No acute or focal
bony abnormality.
IMPRESSION: No acute disease.

Atherosclerosis.

## 2020-08-20 DIAGNOSIS — R808 Other proteinuria: Secondary | ICD-10-CM | POA: Diagnosis not present

## 2020-08-20 DIAGNOSIS — R600 Localized edema: Secondary | ICD-10-CM | POA: Diagnosis not present

## 2020-08-20 DIAGNOSIS — I129 Hypertensive chronic kidney disease with stage 1 through stage 4 chronic kidney disease, or unspecified chronic kidney disease: Secondary | ICD-10-CM | POA: Diagnosis not present

## 2020-08-20 DIAGNOSIS — E559 Vitamin D deficiency, unspecified: Secondary | ICD-10-CM | POA: Diagnosis not present

## 2020-08-20 DIAGNOSIS — E211 Secondary hyperparathyroidism, not elsewhere classified: Secondary | ICD-10-CM | POA: Diagnosis not present

## 2020-08-20 DIAGNOSIS — N185 Chronic kidney disease, stage 5: Secondary | ICD-10-CM | POA: Diagnosis not present

## 2020-08-25 DIAGNOSIS — D638 Anemia in other chronic diseases classified elsewhere: Secondary | ICD-10-CM | POA: Diagnosis not present

## 2020-08-25 DIAGNOSIS — N185 Chronic kidney disease, stage 5: Secondary | ICD-10-CM | POA: Diagnosis not present

## 2020-08-25 DIAGNOSIS — E559 Vitamin D deficiency, unspecified: Secondary | ICD-10-CM | POA: Diagnosis not present

## 2020-08-25 DIAGNOSIS — R808 Other proteinuria: Secondary | ICD-10-CM | POA: Diagnosis not present

## 2020-08-25 DIAGNOSIS — E211 Secondary hyperparathyroidism, not elsewhere classified: Secondary | ICD-10-CM | POA: Diagnosis not present

## 2020-08-25 DIAGNOSIS — I129 Hypertensive chronic kidney disease with stage 1 through stage 4 chronic kidney disease, or unspecified chronic kidney disease: Secondary | ICD-10-CM | POA: Diagnosis not present

## 2020-09-29 DIAGNOSIS — N185 Chronic kidney disease, stage 5: Secondary | ICD-10-CM | POA: Diagnosis not present

## 2020-09-29 DIAGNOSIS — I129 Hypertensive chronic kidney disease with stage 1 through stage 4 chronic kidney disease, or unspecified chronic kidney disease: Secondary | ICD-10-CM | POA: Diagnosis not present

## 2020-09-29 DIAGNOSIS — R808 Other proteinuria: Secondary | ICD-10-CM | POA: Diagnosis not present

## 2020-09-29 DIAGNOSIS — E211 Secondary hyperparathyroidism, not elsewhere classified: Secondary | ICD-10-CM | POA: Diagnosis not present

## 2020-09-29 DIAGNOSIS — E559 Vitamin D deficiency, unspecified: Secondary | ICD-10-CM | POA: Diagnosis not present

## 2020-09-29 DIAGNOSIS — D638 Anemia in other chronic diseases classified elsewhere: Secondary | ICD-10-CM | POA: Diagnosis not present

## 2020-10-06 DIAGNOSIS — E559 Vitamin D deficiency, unspecified: Secondary | ICD-10-CM | POA: Diagnosis not present

## 2020-10-06 DIAGNOSIS — D638 Anemia in other chronic diseases classified elsewhere: Secondary | ICD-10-CM | POA: Diagnosis not present

## 2020-10-06 DIAGNOSIS — R600 Localized edema: Secondary | ICD-10-CM | POA: Diagnosis not present

## 2020-10-06 DIAGNOSIS — R808 Other proteinuria: Secondary | ICD-10-CM | POA: Diagnosis not present

## 2020-10-06 DIAGNOSIS — E211 Secondary hyperparathyroidism, not elsewhere classified: Secondary | ICD-10-CM | POA: Diagnosis not present

## 2020-10-06 DIAGNOSIS — N185 Chronic kidney disease, stage 5: Secondary | ICD-10-CM | POA: Diagnosis not present

## 2020-10-06 DIAGNOSIS — I9589 Other hypotension: Secondary | ICD-10-CM | POA: Diagnosis not present

## 2020-11-10 DIAGNOSIS — E211 Secondary hyperparathyroidism, not elsewhere classified: Secondary | ICD-10-CM | POA: Diagnosis not present

## 2020-11-10 DIAGNOSIS — I129 Hypertensive chronic kidney disease with stage 1 through stage 4 chronic kidney disease, or unspecified chronic kidney disease: Secondary | ICD-10-CM | POA: Diagnosis not present

## 2020-11-10 DIAGNOSIS — E559 Vitamin D deficiency, unspecified: Secondary | ICD-10-CM | POA: Diagnosis not present

## 2020-11-10 DIAGNOSIS — D638 Anemia in other chronic diseases classified elsewhere: Secondary | ICD-10-CM | POA: Diagnosis not present

## 2020-11-10 DIAGNOSIS — N185 Chronic kidney disease, stage 5: Secondary | ICD-10-CM | POA: Diagnosis not present

## 2020-11-10 DIAGNOSIS — R808 Other proteinuria: Secondary | ICD-10-CM | POA: Diagnosis not present

## 2020-11-17 DIAGNOSIS — R808 Other proteinuria: Secondary | ICD-10-CM | POA: Diagnosis not present

## 2020-11-17 DIAGNOSIS — I129 Hypertensive chronic kidney disease with stage 1 through stage 4 chronic kidney disease, or unspecified chronic kidney disease: Secondary | ICD-10-CM | POA: Diagnosis not present

## 2020-11-17 DIAGNOSIS — N185 Chronic kidney disease, stage 5: Secondary | ICD-10-CM | POA: Diagnosis not present

## 2020-11-17 DIAGNOSIS — E211 Secondary hyperparathyroidism, not elsewhere classified: Secondary | ICD-10-CM | POA: Diagnosis not present

## 2020-11-17 DIAGNOSIS — E559 Vitamin D deficiency, unspecified: Secondary | ICD-10-CM | POA: Diagnosis not present

## 2020-11-23 ENCOUNTER — Encounter: Payer: Self-pay | Admitting: Internal Medicine

## 2020-11-23 ENCOUNTER — Other Ambulatory Visit: Payer: Self-pay

## 2020-11-23 ENCOUNTER — Ambulatory Visit (INDEPENDENT_AMBULATORY_CARE_PROVIDER_SITE_OTHER): Payer: Medicare Other | Admitting: Internal Medicine

## 2020-11-23 VITALS — BP 128/74 | HR 61 | Ht 61.5 in | Wt 117.2 lb

## 2020-11-23 DIAGNOSIS — I10 Essential (primary) hypertension: Secondary | ICD-10-CM

## 2020-11-23 NOTE — Progress Notes (Signed)
Cardiology Office Note   Date:  11/23/2020   ID:  Tracie Harrier, DOB [birth year], MRN [id number]  PCP:  Celene Squibb, MD  Cardiologist:   Dorris Carnes, MD       History of Present Illness: Carly [name] is a 87 y.o. female with a history of CAD  (s/p PTCA/Stet to the LAD and PTCA to ramus in 2003)   Sestamibi in past was negative for ischemia   Pt also with Hx of HTN, HL and CKD     I last saw the pt as a televisit in Jan 2022   She had difficulty with BP Since I saw her she has been seen several times by Dr Graylon Gunning (nephrology) in [place]    Furosemide was added to her regimen and her BP has improved Her daughter brings in log of BP.  OVerall great control 110s to 130s  The pt denies dizziness   Breathing is OK  NO CP    Does not do much    Appetite is OK  Current Meds  Medication Sig   acetaminophen (TYLENOL) 500 MG tablet Take 500 mg by mouth every 6 (six) hours as needed for mild pain.   amLODipine (NORVASC) 5 MG tablet Take 1 tablet (5 mg total) by mouth daily.   aspirin 81 MG tablet Take 81 mg by mouth daily.   furosemide (LASIX) 20 MG tablet Take by mouth.   metoprolol succinate (TOPROL-XL) 50 MG 24 hr tablet Take 1 tablet (50 mg total) by mouth 2 (two) times daily. Take with or immediately following a meal.   nitroGLYCERIN (NITROSTAT) 0.4 MG SL tablet Place 1 tablet (0.4 mg total) under the tongue every 5 (five) minutes as needed for chest pain.   raloxifene (EVISTA) 60 MG tablet Take 60 mg by mouth daily.   simvastatin (ZOCOR) 10 MG tablet Take 1 tablet (10 mg total) by mouth daily at 6 PM.   sodium bicarbonate 650 MG tablet Take by mouth.     Allergies:   Patient has no known allergies.   Past Medical History:  Diagnosis Date   CAD (coronary artery disease)    a. s/p PTCA/stent to LAD and PTCA of ramus in 2003. b. normal nuc 10/2012 with EF 87%   Carotid arterial disease ([place])    a. 07/2014 - duplex 57-84% RICA, 6-96% LICA.   CKD (chronic kidney disease),  stage III (HCC)    Cystocele    Dyslipidemia    Gout    HTN (hypertension)    Transitional cell carcinoma ([place])    a. 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   Vaginal wall prolapse     Past Surgical History:  Procedure Laterality Date   APPENDECTOMY     CORONARY ANGIOPLASTY WITH STENT PLACEMENT     NEPHRECTOMY     right     Social History:  The patient  reports that she has never smoked. She has never used smokeless tobacco. She reports that she does not drink alcohol and does not use drugs.   Family History:  The patient's family history includes Cancer in her unknown relative; Diabetes in her father; Heart attack in her father; Heart disease in her unknown relative.    ROS:  Please see the history of present illness. All other systems are reviewed and  Negative to the above problem except as noted.    PHYSICAL EXAM: VS:  BP 128/74   Pulse 61  Ht 5' 1.5" (1.562 m)   Wt 117 lb 3.2 oz (53.2 kg)   BMI 21.79 kg/m   GEN: Well nourished, well developed, in no acute distress   Examined in chair HEENT: normal  Neck: no JVD, carotid bruits Cardiac: RRR; no murmurs,   [place] LE edema  Respiratory:  clear to auscultation bilaterally  Mild rhonchi GI: soft, nontender, nondistended, + BS  No hepatomegaly  MS: no deformity Moving all extremities   Skin: warm and dry, no rash Neuro:  Strength and sensation are intact Psych: euthymic mood, full affect   EKG:  EKG is ordered today.  SR 61 bpm   LAFB     Lipid Panel    Component Value Date/Time   CHOL 160 02/09/2020 1654   TRIG 111 02/09/2020 1654   HDL 72 02/09/2020 1654   CHOLHDL 2.2 02/09/2020 1654   CHOLHDL 2.6 05/20/2015 1223   VLDL 26 05/20/2015 1223   LDLCALC 68 02/09/2020 1654   LDLDIRECT 72.6 11/10/2013 0949      Wt Readings from Last 3 Encounters:  11/23/20 117 lb 3.2 oz (53.2 kg)  04/19/20 118 lb (53.5 kg)  02/09/20 118 lb 12.8 oz (53.9 kg)       ASSESSMENT AND PLAN:  1  BP  Controlled on current regine   I would cotninue   I think continued f/u in renal clinic is important to follow up renal function if changes made  2  CAD   No symptoms to sugg angina    Follow   3  HL  Keep on simvistatin    LDL in Nov 2021 was 68  HDL 72     Will be available as needed for CP, SOB   It is easier for her to follow up in [place]  She will have her BP and renal function followed there      Current medicines are reviewed at length with the patient today.  The patient does not have concerns regarding medicines.  Signed, Dorris Carnes, MD  11/23/2020 7:29 PM    [address], [place], [place]  [address] Phone: [phone]; Fax: [phone]

## 2020-11-23 NOTE — Patient Instructions (Signed)
Medication Instructions:  No changes *If you need a refill on your cardiac medications before your next appointment, please call your pharmacy*   Lab Work: none If you have labs (blood work) drawn today and your tests are completely normal, you will receive your results only by: [place] (if you have MyChart) OR A paper copy in the mail If you have any lab test that is abnormal or we need to change your treatment, we will call you to review the results.   Testing/Procedures: none   Other Instructions Please call if any concerns.  No follow up arranged at this time.

## 2021-01-06 DIAGNOSIS — Z23 Encounter for immunization: Secondary | ICD-10-CM | POA: Diagnosis not present

## 2021-01-12 DIAGNOSIS — E211 Secondary hyperparathyroidism, not elsewhere classified: Secondary | ICD-10-CM | POA: Diagnosis not present

## 2021-01-12 DIAGNOSIS — N185 Chronic kidney disease, stage 5: Secondary | ICD-10-CM | POA: Diagnosis not present

## 2021-01-12 DIAGNOSIS — I129 Hypertensive chronic kidney disease with stage 1 through stage 4 chronic kidney disease, or unspecified chronic kidney disease: Secondary | ICD-10-CM | POA: Diagnosis not present

## 2021-01-12 DIAGNOSIS — T452X1A Poisoning by vitamins, accidental (unintentional), initial encounter: Secondary | ICD-10-CM | POA: Diagnosis not present

## 2021-01-12 DIAGNOSIS — R808 Other proteinuria: Secondary | ICD-10-CM | POA: Diagnosis not present

## 2021-01-19 DIAGNOSIS — R808 Other proteinuria: Secondary | ICD-10-CM | POA: Diagnosis not present

## 2021-01-19 DIAGNOSIS — N185 Chronic kidney disease, stage 5: Secondary | ICD-10-CM | POA: Diagnosis not present

## 2021-01-19 DIAGNOSIS — I129 Hypertensive chronic kidney disease with stage 1 through stage 4 chronic kidney disease, or unspecified chronic kidney disease: Secondary | ICD-10-CM | POA: Diagnosis not present

## 2021-01-19 DIAGNOSIS — Z23 Encounter for immunization: Secondary | ICD-10-CM | POA: Diagnosis not present

## 2021-01-19 DIAGNOSIS — E211 Secondary hyperparathyroidism, not elsewhere classified: Secondary | ICD-10-CM | POA: Diagnosis not present

## 2021-01-19 DIAGNOSIS — E559 Vitamin D deficiency, unspecified: Secondary | ICD-10-CM | POA: Diagnosis not present

## 2021-03-23 DIAGNOSIS — S0093XA Contusion of unspecified part of head, initial encounter: Secondary | ICD-10-CM | POA: Diagnosis not present

## 2021-03-23 DIAGNOSIS — W19XXXA Unspecified fall, initial encounter: Secondary | ICD-10-CM | POA: Diagnosis not present

## 2021-03-23 DIAGNOSIS — S8010XA Contusion of unspecified lower leg, initial encounter: Secondary | ICD-10-CM | POA: Diagnosis not present

## 2021-03-23 DIAGNOSIS — Z0001 Encounter for general adult medical examination with abnormal findings: Secondary | ICD-10-CM | POA: Diagnosis not present

## 2021-04-18 ENCOUNTER — Other Ambulatory Visit: Payer: Self-pay | Admitting: Internal Medicine

## 2021-04-30 DIAGNOSIS — I2510 Atherosclerotic heart disease of native coronary artery without angina pectoris: Secondary | ICD-10-CM | POA: Diagnosis not present

## 2021-04-30 DIAGNOSIS — N185 Chronic kidney disease, stage 5: Secondary | ICD-10-CM | POA: Diagnosis not present

## 2021-04-30 DIAGNOSIS — N2581 Secondary hyperparathyroidism of renal origin: Secondary | ICD-10-CM | POA: Diagnosis not present

## 2021-04-30 DIAGNOSIS — R296 Repeated falls: Secondary | ICD-10-CM | POA: Diagnosis not present

## 2021-04-30 DIAGNOSIS — N393 Stress incontinence (female) (male): Secondary | ICD-10-CM | POA: Diagnosis not present

## 2021-04-30 DIAGNOSIS — I120 Hypertensive chronic kidney disease with stage 5 chronic kidney disease or end stage renal disease: Secondary | ICD-10-CM | POA: Diagnosis not present

## 2021-04-30 DIAGNOSIS — R2689 Other abnormalities of gait and mobility: Secondary | ICD-10-CM | POA: Diagnosis not present

## 2021-04-30 DIAGNOSIS — M1030 Gout due to renal impairment, unspecified site: Secondary | ICD-10-CM | POA: Diagnosis not present

## 2021-04-30 DIAGNOSIS — E7849 Other hyperlipidemia: Secondary | ICD-10-CM | POA: Diagnosis not present

## 2021-04-30 DIAGNOSIS — E559 Vitamin D deficiency, unspecified: Secondary | ICD-10-CM | POA: Diagnosis not present

## 2021-04-30 DIAGNOSIS — Z7982 Long term (current) use of aspirin: Secondary | ICD-10-CM | POA: Diagnosis not present

## 2021-04-30 DIAGNOSIS — Z9181 History of falling: Secondary | ICD-10-CM | POA: Diagnosis not present

## 2021-04-30 DIAGNOSIS — M6281 Muscle weakness (generalized): Secondary | ICD-10-CM | POA: Diagnosis not present

## 2021-04-30 DIAGNOSIS — M159 Polyosteoarthritis, unspecified: Secondary | ICD-10-CM | POA: Diagnosis not present

## 2021-04-30 DIAGNOSIS — M898X9 Other specified disorders of bone, unspecified site: Secondary | ICD-10-CM | POA: Diagnosis not present

## 2021-05-03 ENCOUNTER — Other Ambulatory Visit (HOSPITAL_COMMUNITY)
Admission: RE | Admit: 2021-05-03 | Discharge: 2021-05-03 | Disposition: A | Discharge status: Home / Self Care | Payer: Medicare Other | Source: Other Acute Inpatient Hospital | Attending: Nephrology | Admitting: Nephrology

## 2021-05-03 DIAGNOSIS — Z79899 Other long term (current) drug therapy: Secondary | ICD-10-CM | POA: Diagnosis not present

## 2021-05-03 DIAGNOSIS — N185 Chronic kidney disease, stage 5: Secondary | ICD-10-CM | POA: Diagnosis not present

## 2021-05-03 DIAGNOSIS — I2510 Atherosclerotic heart disease of native coronary artery without angina pectoris: Secondary | ICD-10-CM | POA: Diagnosis not present

## 2021-05-03 DIAGNOSIS — R809 Proteinuria, unspecified: Secondary | ICD-10-CM | POA: Diagnosis not present

## 2021-05-03 DIAGNOSIS — I120 Hypertensive chronic kidney disease with stage 5 chronic kidney disease or end stage renal disease: Secondary | ICD-10-CM | POA: Diagnosis not present

## 2021-05-03 DIAGNOSIS — M159 Polyosteoarthritis, unspecified: Secondary | ICD-10-CM | POA: Diagnosis not present

## 2021-05-03 DIAGNOSIS — R2689 Other abnormalities of gait and mobility: Secondary | ICD-10-CM | POA: Diagnosis not present

## 2021-05-03 DIAGNOSIS — D638 Anemia in other chronic diseases classified elsewhere: Secondary | ICD-10-CM | POA: Diagnosis not present

## 2021-05-03 DIAGNOSIS — N189 Chronic kidney disease, unspecified: Secondary | ICD-10-CM | POA: Insufficient documentation

## 2021-05-03 DIAGNOSIS — R296 Repeated falls: Secondary | ICD-10-CM | POA: Diagnosis not present

## 2021-05-03 LAB — CBC WITH DIFFERENTIAL/PLATELET
Abs Immature Granulocytes: 0.04 10*3/uL (ref 0.00–0.07)
Basophils Absolute: 0.1 10*3/uL (ref 0.0–0.1)
Basophils Relative: 1 %
Eosinophils Absolute: 0.8 10*3/uL — ABNORMAL HIGH (ref 0.0–0.5)
Eosinophils Relative: 8 %
HCT: 34.5 % — ABNORMAL LOW (ref 36.0–46.0)
Hemoglobin: 10.9 g/dL — ABNORMAL LOW (ref 12.0–15.0)
Immature Granulocytes: 0 %
Lymphocytes Relative: 27 %
Lymphs Abs: 2.7 10*3/uL (ref 0.7–4.0)
MCH: 30.5 pg (ref 26.0–34.0)
MCHC: 31.6 g/dL (ref 30.0–36.0)
MCV: 96.6 fL (ref 80.0–100.0)
Monocytes Absolute: 0.8 10*3/uL (ref 0.1–1.0)
Monocytes Relative: 8 %
Neutro Abs: 5.5 10*3/uL (ref 1.7–7.7)
Neutrophils Relative %: 56 %
Platelets: 190 10*3/uL (ref 150–400)
RBC: 3.57 MIL/uL — ABNORMAL LOW (ref 3.87–5.11)
RDW: 13.6 % (ref 11.5–15.5)
WBC: 9.8 10*3/uL (ref 4.0–10.5)
nRBC: 0 % (ref 0.0–0.2)

## 2021-05-03 LAB — PROTEIN / CREATININE RATIO, URINE
Creatinine, Urine: 103 mg/dL
Protein Creatinine Ratio: 0.06 mg/mg{Cre} (ref 0.00–0.15)
Total Protein, Urine: 6 mg/dL

## 2021-05-03 LAB — IRON AND TIBC
Iron: 77 ug/dL (ref 28–170)
Saturation Ratios: 22 % (ref 10.4–31.8)
TIBC: 343 ug/dL (ref 250–450)
UIBC: 266 ug/dL

## 2021-05-03 LAB — VITAMIN D 25 HYDROXY (VIT D DEFICIENCY, FRACTURES): Vit D, 25-Hydroxy: 28.34 ng/mL — ABNORMAL LOW (ref 30–100)

## 2021-05-03 LAB — FERRITIN: Ferritin: 46 ng/mL (ref 11–307)

## 2021-05-04 LAB — PTH, INTACT AND CALCIUM
Calcium, Total (PTH): 8.5 mg/dL — ABNORMAL LOW (ref 8.7–10.3)
PTH: 181 pg/mL — ABNORMAL HIGH (ref 15–65)

## 2021-05-06 DIAGNOSIS — I120 Hypertensive chronic kidney disease with stage 5 chronic kidney disease or end stage renal disease: Secondary | ICD-10-CM | POA: Diagnosis not present

## 2021-05-06 DIAGNOSIS — I2510 Atherosclerotic heart disease of native coronary artery without angina pectoris: Secondary | ICD-10-CM | POA: Diagnosis not present

## 2021-05-06 DIAGNOSIS — N185 Chronic kidney disease, stage 5: Secondary | ICD-10-CM | POA: Diagnosis not present

## 2021-05-06 DIAGNOSIS — R296 Repeated falls: Secondary | ICD-10-CM | POA: Diagnosis not present

## 2021-05-06 DIAGNOSIS — M159 Polyosteoarthritis, unspecified: Secondary | ICD-10-CM | POA: Diagnosis not present

## 2021-05-06 DIAGNOSIS — R2689 Other abnormalities of gait and mobility: Secondary | ICD-10-CM | POA: Diagnosis not present

## 2021-05-10 DIAGNOSIS — M159 Polyosteoarthritis, unspecified: Secondary | ICD-10-CM | POA: Diagnosis not present

## 2021-05-10 DIAGNOSIS — M6281 Muscle weakness (generalized): Secondary | ICD-10-CM | POA: Insufficient documentation

## 2021-05-10 DIAGNOSIS — R296 Repeated falls: Secondary | ICD-10-CM | POA: Diagnosis not present

## 2021-05-10 DIAGNOSIS — I120 Hypertensive chronic kidney disease with stage 5 chronic kidney disease or end stage renal disease: Secondary | ICD-10-CM | POA: Diagnosis not present

## 2021-05-10 DIAGNOSIS — R2689 Other abnormalities of gait and mobility: Secondary | ICD-10-CM | POA: Diagnosis not present

## 2021-05-10 DIAGNOSIS — I2510 Atherosclerotic heart disease of native coronary artery without angina pectoris: Secondary | ICD-10-CM | POA: Diagnosis not present

## 2021-05-10 DIAGNOSIS — N185 Chronic kidney disease, stage 5: Secondary | ICD-10-CM | POA: Diagnosis not present

## 2021-05-13 ENCOUNTER — Other Ambulatory Visit: Payer: Self-pay | Admitting: Internal Medicine

## 2021-05-13 DIAGNOSIS — E8722 Chronic metabolic acidosis: Secondary | ICD-10-CM | POA: Diagnosis not present

## 2021-05-13 DIAGNOSIS — R296 Repeated falls: Secondary | ICD-10-CM | POA: Diagnosis not present

## 2021-05-13 DIAGNOSIS — I129 Hypertensive chronic kidney disease with stage 1 through stage 4 chronic kidney disease, or unspecified chronic kidney disease: Secondary | ICD-10-CM | POA: Diagnosis not present

## 2021-05-13 DIAGNOSIS — I2510 Atherosclerotic heart disease of native coronary artery without angina pectoris: Secondary | ICD-10-CM | POA: Diagnosis not present

## 2021-05-13 DIAGNOSIS — I120 Hypertensive chronic kidney disease with stage 5 chronic kidney disease or end stage renal disease: Secondary | ICD-10-CM | POA: Diagnosis not present

## 2021-05-13 DIAGNOSIS — M159 Polyosteoarthritis, unspecified: Secondary | ICD-10-CM | POA: Diagnosis not present

## 2021-05-13 DIAGNOSIS — R2689 Other abnormalities of gait and mobility: Secondary | ICD-10-CM | POA: Diagnosis not present

## 2021-05-13 DIAGNOSIS — R808 Other proteinuria: Secondary | ICD-10-CM | POA: Diagnosis not present

## 2021-05-13 DIAGNOSIS — N185 Chronic kidney disease, stage 5: Secondary | ICD-10-CM | POA: Diagnosis not present

## 2021-05-13 DIAGNOSIS — E211 Secondary hyperparathyroidism, not elsewhere classified: Secondary | ICD-10-CM | POA: Diagnosis not present

## 2021-05-18 DIAGNOSIS — I2510 Atherosclerotic heart disease of native coronary artery without angina pectoris: Secondary | ICD-10-CM | POA: Diagnosis not present

## 2021-05-18 DIAGNOSIS — R296 Repeated falls: Secondary | ICD-10-CM | POA: Diagnosis not present

## 2021-05-18 DIAGNOSIS — M159 Polyosteoarthritis, unspecified: Secondary | ICD-10-CM | POA: Diagnosis not present

## 2021-05-18 DIAGNOSIS — R2689 Other abnormalities of gait and mobility: Secondary | ICD-10-CM | POA: Diagnosis not present

## 2021-05-18 DIAGNOSIS — I120 Hypertensive chronic kidney disease with stage 5 chronic kidney disease or end stage renal disease: Secondary | ICD-10-CM | POA: Diagnosis not present

## 2021-05-18 DIAGNOSIS — N185 Chronic kidney disease, stage 5: Secondary | ICD-10-CM | POA: Diagnosis not present

## 2021-05-19 DIAGNOSIS — R2689 Other abnormalities of gait and mobility: Secondary | ICD-10-CM | POA: Diagnosis not present

## 2021-05-19 DIAGNOSIS — N185 Chronic kidney disease, stage 5: Secondary | ICD-10-CM | POA: Diagnosis not present

## 2021-05-19 DIAGNOSIS — I120 Hypertensive chronic kidney disease with stage 5 chronic kidney disease or end stage renal disease: Secondary | ICD-10-CM | POA: Diagnosis not present

## 2021-05-19 DIAGNOSIS — I2510 Atherosclerotic heart disease of native coronary artery without angina pectoris: Secondary | ICD-10-CM | POA: Diagnosis not present

## 2021-05-19 DIAGNOSIS — M159 Polyosteoarthritis, unspecified: Secondary | ICD-10-CM | POA: Diagnosis not present

## 2021-05-19 DIAGNOSIS — R296 Repeated falls: Secondary | ICD-10-CM | POA: Diagnosis not present

## 2021-05-20 DIAGNOSIS — N185 Chronic kidney disease, stage 5: Secondary | ICD-10-CM | POA: Diagnosis not present

## 2021-05-20 DIAGNOSIS — I129 Hypertensive chronic kidney disease with stage 1 through stage 4 chronic kidney disease, or unspecified chronic kidney disease: Secondary | ICD-10-CM | POA: Diagnosis not present

## 2021-05-20 DIAGNOSIS — R808 Other proteinuria: Secondary | ICD-10-CM | POA: Diagnosis not present

## 2021-05-20 DIAGNOSIS — E8722 Chronic metabolic acidosis: Secondary | ICD-10-CM | POA: Diagnosis not present

## 2021-05-20 DIAGNOSIS — R600 Localized edema: Secondary | ICD-10-CM | POA: Diagnosis not present

## 2021-05-20 DIAGNOSIS — E211 Secondary hyperparathyroidism, not elsewhere classified: Secondary | ICD-10-CM | POA: Diagnosis not present

## 2021-05-24 ENCOUNTER — Other Ambulatory Visit: Payer: Self-pay | Admitting: Internal Medicine

## 2021-05-24 DIAGNOSIS — M159 Polyosteoarthritis, unspecified: Secondary | ICD-10-CM | POA: Insufficient documentation

## 2021-06-29 DIAGNOSIS — E559 Vitamin D deficiency, unspecified: Secondary | ICD-10-CM | POA: Diagnosis not present

## 2021-06-29 DIAGNOSIS — M159 Polyosteoarthritis, unspecified: Secondary | ICD-10-CM | POA: Diagnosis not present

## 2021-06-29 DIAGNOSIS — M898X9 Other specified disorders of bone, unspecified site: Secondary | ICD-10-CM | POA: Diagnosis not present

## 2021-06-29 DIAGNOSIS — N2581 Secondary hyperparathyroidism of renal origin: Secondary | ICD-10-CM | POA: Diagnosis not present

## 2021-06-29 DIAGNOSIS — R296 Repeated falls: Secondary | ICD-10-CM | POA: Diagnosis not present

## 2021-06-29 DIAGNOSIS — M6281 Muscle weakness (generalized): Secondary | ICD-10-CM | POA: Diagnosis not present

## 2021-06-29 DIAGNOSIS — I120 Hypertensive chronic kidney disease with stage 5 chronic kidney disease or end stage renal disease: Secondary | ICD-10-CM | POA: Diagnosis not present

## 2021-06-29 DIAGNOSIS — R2689 Other abnormalities of gait and mobility: Secondary | ICD-10-CM | POA: Diagnosis not present

## 2021-06-29 DIAGNOSIS — N185 Chronic kidney disease, stage 5: Secondary | ICD-10-CM | POA: Diagnosis not present

## 2021-06-29 DIAGNOSIS — I2510 Atherosclerotic heart disease of native coronary artery without angina pectoris: Secondary | ICD-10-CM | POA: Diagnosis not present

## 2021-07-29 DIAGNOSIS — M898X9 Other specified disorders of bone, unspecified site: Secondary | ICD-10-CM | POA: Diagnosis not present

## 2021-07-29 DIAGNOSIS — I2510 Atherosclerotic heart disease of native coronary artery without angina pectoris: Secondary | ICD-10-CM | POA: Diagnosis not present

## 2021-07-29 DIAGNOSIS — D631 Anemia in chronic kidney disease: Secondary | ICD-10-CM | POA: Diagnosis not present

## 2021-07-29 DIAGNOSIS — Z9181 History of falling: Secondary | ICD-10-CM | POA: Diagnosis not present

## 2021-07-29 DIAGNOSIS — N2581 Secondary hyperparathyroidism of renal origin: Secondary | ICD-10-CM | POA: Diagnosis not present

## 2021-07-29 DIAGNOSIS — M1030 Gout due to renal impairment, unspecified site: Secondary | ICD-10-CM | POA: Diagnosis not present

## 2021-07-29 DIAGNOSIS — I120 Hypertensive chronic kidney disease with stage 5 chronic kidney disease or end stage renal disease: Secondary | ICD-10-CM | POA: Diagnosis not present

## 2021-07-29 DIAGNOSIS — N185 Chronic kidney disease, stage 5: Secondary | ICD-10-CM | POA: Diagnosis not present

## 2021-07-29 DIAGNOSIS — E559 Vitamin D deficiency, unspecified: Secondary | ICD-10-CM | POA: Diagnosis not present

## 2021-07-29 DIAGNOSIS — E7849 Other hyperlipidemia: Secondary | ICD-10-CM | POA: Diagnosis not present

## 2021-08-05 DIAGNOSIS — D631 Anemia in chronic kidney disease: Secondary | ICD-10-CM | POA: Diagnosis not present

## 2021-08-05 DIAGNOSIS — N185 Chronic kidney disease, stage 5: Secondary | ICD-10-CM | POA: Diagnosis not present

## 2021-08-05 DIAGNOSIS — N2581 Secondary hyperparathyroidism of renal origin: Secondary | ICD-10-CM | POA: Diagnosis not present

## 2021-08-05 DIAGNOSIS — I120 Hypertensive chronic kidney disease with stage 5 chronic kidney disease or end stage renal disease: Secondary | ICD-10-CM | POA: Diagnosis not present

## 2021-08-11 DIAGNOSIS — D631 Anemia in chronic kidney disease: Secondary | ICD-10-CM | POA: Diagnosis not present

## 2021-08-11 DIAGNOSIS — N2581 Secondary hyperparathyroidism of renal origin: Secondary | ICD-10-CM | POA: Diagnosis not present

## 2021-08-11 DIAGNOSIS — I120 Hypertensive chronic kidney disease with stage 5 chronic kidney disease or end stage renal disease: Secondary | ICD-10-CM | POA: Diagnosis not present

## 2021-08-11 DIAGNOSIS — N185 Chronic kidney disease, stage 5: Secondary | ICD-10-CM | POA: Diagnosis not present

## 2021-08-16 ENCOUNTER — Other Ambulatory Visit (HOSPITAL_COMMUNITY)
Admission: RE | Admit: 2021-08-16 | Discharge: 2021-08-16 | Disposition: A | Discharge status: Home / Self Care | Payer: Medicare Other | Source: Other Acute Inpatient Hospital | Attending: Nephrology | Admitting: Nephrology

## 2021-08-16 DIAGNOSIS — D631 Anemia in chronic kidney disease: Secondary | ICD-10-CM | POA: Diagnosis not present

## 2021-08-16 DIAGNOSIS — I120 Hypertensive chronic kidney disease with stage 5 chronic kidney disease or end stage renal disease: Secondary | ICD-10-CM | POA: Insufficient documentation

## 2021-08-16 DIAGNOSIS — N2581 Secondary hyperparathyroidism of renal origin: Secondary | ICD-10-CM | POA: Diagnosis not present

## 2021-08-16 DIAGNOSIS — N185 Chronic kidney disease, stage 5: Secondary | ICD-10-CM | POA: Diagnosis not present

## 2021-08-16 LAB — CBC WITH DIFFERENTIAL/PLATELET
Abs Immature Granulocytes: 0.04 10*3/uL (ref 0.00–0.07)
Basophils Absolute: 0.1 10*3/uL (ref 0.0–0.1)
Basophils Relative: 1 %
Eosinophils Absolute: 0.7 10*3/uL — ABNORMAL HIGH (ref 0.0–0.5)
Eosinophils Relative: 6 %
HCT: 33.8 % — ABNORMAL LOW (ref 36.0–46.0)
Hemoglobin: 11 g/dL — ABNORMAL LOW (ref 12.0–15.0)
Immature Granulocytes: 0 %
Lymphocytes Relative: 25 %
Lymphs Abs: 2.7 10*3/uL (ref 0.7–4.0)
MCH: 31.6 pg (ref 26.0–34.0)
MCHC: 32.5 g/dL (ref 30.0–36.0)
MCV: 97.1 fL (ref 80.0–100.0)
Monocytes Absolute: 0.9 10*3/uL (ref 0.1–1.0)
Monocytes Relative: 8 %
Neutro Abs: 6.6 10*3/uL (ref 1.7–7.7)
Neutrophils Relative %: 60 %
Platelets: 184 10*3/uL (ref 150–400)
RBC: 3.48 MIL/uL — ABNORMAL LOW (ref 3.87–5.11)
RDW: 13.7 % (ref 11.5–15.5)
WBC: 10.9 10*3/uL — ABNORMAL HIGH (ref 4.0–10.5)
nRBC: 0 % (ref 0.0–0.2)

## 2021-08-16 LAB — IRON AND TIBC
Iron: 57 ug/dL (ref 28–170)
Saturation Ratios: 17 % (ref 10.4–31.8)
TIBC: 334 ug/dL (ref 250–450)
UIBC: 277 ug/dL

## 2021-08-16 LAB — PROTEIN / CREATININE RATIO, URINE
Creatinine, Urine: 37.56 mg/dL
Protein Creatinine Ratio: 2.58 mg/mg{Cre} — ABNORMAL HIGH (ref 0.00–0.15)
Total Protein, Urine: 97 mg/dL

## 2021-08-16 LAB — VITAMIN D 25 HYDROXY (VIT D DEFICIENCY, FRACTURES): Vit D, 25-Hydroxy: 34.53 ng/mL (ref 30–100)

## 2021-08-16 LAB — RENAL FUNCTION PANEL
Albumin: 3.5 g/dL (ref 3.5–5.0)
Anion gap: 12 (ref 5–15)
BUN: 87 mg/dL — ABNORMAL HIGH (ref 8–23)
CO2: 21 mmol/L — ABNORMAL LOW (ref 22–32)
Calcium: 8.3 mg/dL — ABNORMAL LOW (ref 8.9–10.3)
Chloride: 104 mmol/L (ref 98–111)
Creatinine, Ser: 3.43 mg/dL — ABNORMAL HIGH (ref 0.44–1.00)
GFR, Estimated: 11 mL/min — ABNORMAL LOW (ref >60)
Glucose, Bld: 159 mg/dL — ABNORMAL HIGH (ref 70–99)
Phosphorus: 5 mg/dL — ABNORMAL HIGH (ref 2.5–4.6)
Potassium: 4.1 mmol/L (ref 3.5–5.1)
Sodium: 137 mmol/L (ref 135–145)

## 2021-08-16 LAB — FERRITIN: Ferritin: 44 ng/mL (ref 11–307)

## 2021-08-17 LAB — PTH, INTACT AND CALCIUM
Calcium, Total (PTH): 8.4 mg/dL — ABNORMAL LOW (ref 8.7–10.3)
PTH: 226 pg/mL — ABNORMAL HIGH (ref 15–65)

## 2021-08-25 DIAGNOSIS — D638 Anemia in other chronic diseases classified elsewhere: Secondary | ICD-10-CM | POA: Diagnosis not present

## 2021-08-25 DIAGNOSIS — R808 Other proteinuria: Secondary | ICD-10-CM | POA: Diagnosis not present

## 2021-08-25 DIAGNOSIS — E8722 Chronic metabolic acidosis: Secondary | ICD-10-CM | POA: Diagnosis not present

## 2021-08-25 DIAGNOSIS — N185 Chronic kidney disease, stage 5: Secondary | ICD-10-CM | POA: Diagnosis not present

## 2021-08-25 DIAGNOSIS — I129 Hypertensive chronic kidney disease with stage 1 through stage 4 chronic kidney disease, or unspecified chronic kidney disease: Secondary | ICD-10-CM | POA: Diagnosis not present

## 2021-08-25 DIAGNOSIS — E211 Secondary hyperparathyroidism, not elsewhere classified: Secondary | ICD-10-CM | POA: Diagnosis not present

## 2021-08-26 DIAGNOSIS — N2581 Secondary hyperparathyroidism of renal origin: Secondary | ICD-10-CM | POA: Diagnosis not present

## 2021-08-26 DIAGNOSIS — D631 Anemia in chronic kidney disease: Secondary | ICD-10-CM | POA: Diagnosis not present

## 2021-08-26 DIAGNOSIS — I120 Hypertensive chronic kidney disease with stage 5 chronic kidney disease or end stage renal disease: Secondary | ICD-10-CM | POA: Diagnosis not present

## 2021-08-26 DIAGNOSIS — N185 Chronic kidney disease, stage 5: Secondary | ICD-10-CM | POA: Diagnosis not present

## 2021-08-28 DIAGNOSIS — N2581 Secondary hyperparathyroidism of renal origin: Secondary | ICD-10-CM | POA: Diagnosis not present

## 2021-08-28 DIAGNOSIS — E559 Vitamin D deficiency, unspecified: Secondary | ICD-10-CM | POA: Diagnosis not present

## 2021-08-28 DIAGNOSIS — E7849 Other hyperlipidemia: Secondary | ICD-10-CM | POA: Diagnosis not present

## 2021-08-28 DIAGNOSIS — M1030 Gout due to renal impairment, unspecified site: Secondary | ICD-10-CM | POA: Diagnosis not present

## 2021-08-28 DIAGNOSIS — N185 Chronic kidney disease, stage 5: Secondary | ICD-10-CM | POA: Diagnosis not present

## 2021-08-28 DIAGNOSIS — I120 Hypertensive chronic kidney disease with stage 5 chronic kidney disease or end stage renal disease: Secondary | ICD-10-CM | POA: Diagnosis not present

## 2021-08-28 DIAGNOSIS — Z9181 History of falling: Secondary | ICD-10-CM | POA: Diagnosis not present

## 2021-08-28 DIAGNOSIS — M898X9 Other specified disorders of bone, unspecified site: Secondary | ICD-10-CM | POA: Diagnosis not present

## 2021-08-28 DIAGNOSIS — D631 Anemia in chronic kidney disease: Secondary | ICD-10-CM | POA: Diagnosis not present

## 2021-08-28 DIAGNOSIS — I2510 Atherosclerotic heart disease of native coronary artery without angina pectoris: Secondary | ICD-10-CM | POA: Diagnosis not present

## 2021-08-31 DIAGNOSIS — D631 Anemia in chronic kidney disease: Secondary | ICD-10-CM | POA: Diagnosis not present

## 2021-08-31 DIAGNOSIS — I120 Hypertensive chronic kidney disease with stage 5 chronic kidney disease or end stage renal disease: Secondary | ICD-10-CM | POA: Diagnosis not present

## 2021-08-31 DIAGNOSIS — N185 Chronic kidney disease, stage 5: Secondary | ICD-10-CM | POA: Diagnosis not present

## 2021-08-31 DIAGNOSIS — N2581 Secondary hyperparathyroidism of renal origin: Secondary | ICD-10-CM | POA: Diagnosis not present

## 2021-09-08 DIAGNOSIS — N185 Chronic kidney disease, stage 5: Secondary | ICD-10-CM | POA: Diagnosis not present

## 2021-09-08 DIAGNOSIS — D631 Anemia in chronic kidney disease: Secondary | ICD-10-CM | POA: Diagnosis not present

## 2021-09-08 DIAGNOSIS — N2581 Secondary hyperparathyroidism of renal origin: Secondary | ICD-10-CM | POA: Diagnosis not present

## 2021-09-08 DIAGNOSIS — I120 Hypertensive chronic kidney disease with stage 5 chronic kidney disease or end stage renal disease: Secondary | ICD-10-CM | POA: Diagnosis not present

## 2021-09-13 DIAGNOSIS — I120 Hypertensive chronic kidney disease with stage 5 chronic kidney disease or end stage renal disease: Secondary | ICD-10-CM | POA: Diagnosis not present

## 2021-09-13 DIAGNOSIS — N185 Chronic kidney disease, stage 5: Secondary | ICD-10-CM | POA: Diagnosis not present

## 2021-09-13 DIAGNOSIS — N2581 Secondary hyperparathyroidism of renal origin: Secondary | ICD-10-CM | POA: Diagnosis not present

## 2021-09-13 DIAGNOSIS — D631 Anemia in chronic kidney disease: Secondary | ICD-10-CM | POA: Diagnosis not present

## 2021-09-22 DIAGNOSIS — N2581 Secondary hyperparathyroidism of renal origin: Secondary | ICD-10-CM | POA: Diagnosis not present

## 2021-09-22 DIAGNOSIS — I120 Hypertensive chronic kidney disease with stage 5 chronic kidney disease or end stage renal disease: Secondary | ICD-10-CM | POA: Diagnosis not present

## 2021-09-22 DIAGNOSIS — N185 Chronic kidney disease, stage 5: Secondary | ICD-10-CM | POA: Diagnosis not present

## 2021-09-22 DIAGNOSIS — D631 Anemia in chronic kidney disease: Secondary | ICD-10-CM | POA: Diagnosis not present

## 2021-11-23 DIAGNOSIS — I2510 Atherosclerotic heart disease of native coronary artery without angina pectoris: Secondary | ICD-10-CM | POA: Diagnosis not present

## 2021-11-23 DIAGNOSIS — R809 Proteinuria, unspecified: Secondary | ICD-10-CM | POA: Diagnosis not present

## 2021-11-23 DIAGNOSIS — N2581 Secondary hyperparathyroidism of renal origin: Secondary | ICD-10-CM | POA: Diagnosis not present

## 2021-11-23 DIAGNOSIS — E8722 Chronic metabolic acidosis: Secondary | ICD-10-CM | POA: Diagnosis not present

## 2021-11-23 DIAGNOSIS — E7849 Other hyperlipidemia: Secondary | ICD-10-CM | POA: Diagnosis not present

## 2021-11-23 DIAGNOSIS — D638 Anemia in other chronic diseases classified elsewhere: Secondary | ICD-10-CM | POA: Diagnosis not present

## 2021-11-23 DIAGNOSIS — Z905 Acquired absence of kidney: Secondary | ICD-10-CM | POA: Diagnosis not present

## 2021-11-23 DIAGNOSIS — N183 Chronic kidney disease, stage 3 unspecified: Secondary | ICD-10-CM | POA: Diagnosis not present

## 2021-11-23 DIAGNOSIS — M1030 Gout due to renal impairment, unspecified site: Secondary | ICD-10-CM | POA: Diagnosis not present

## 2021-11-23 DIAGNOSIS — I120 Hypertensive chronic kidney disease with stage 5 chronic kidney disease or end stage renal disease: Secondary | ICD-10-CM | POA: Diagnosis not present

## 2021-11-23 DIAGNOSIS — D631 Anemia in chronic kidney disease: Secondary | ICD-10-CM | POA: Diagnosis not present

## 2021-11-23 DIAGNOSIS — N185 Chronic kidney disease, stage 5: Secondary | ICD-10-CM | POA: Diagnosis not present

## 2021-11-24 ENCOUNTER — Other Ambulatory Visit: Payer: Self-pay | Admitting: Internal Medicine

## 2021-11-24 ENCOUNTER — Other Ambulatory Visit: Payer: Self-pay

## 2021-11-25 ENCOUNTER — Telehealth: Payer: Self-pay | Admitting: Internal Medicine

## 2021-11-25 MED ORDER — METOPROLOL SUCCINATE ER 50 MG PO TB24: 50.0000 mg | ORAL_TABLET | Freq: Two times a day (BID) | ORAL | 0 refills | Status: DC

## 2021-11-25 NOTE — Telephone Encounter (Signed)
Pt's medication was sent to pt's pharmacy as requested. Confirmation received.  °

## 2021-11-25 NOTE — Telephone Encounter (Signed)
It is OK to refill

## 2021-11-25 NOTE — Telephone Encounter (Signed)
Patient's daughter, Jackquline Denmark, came in today concerned that Ms. Porada was only able to get a one month refill on her Metoprolol.  She usually gets 90 days.  The pharmacist said that she was over due for a visit with Dr. Harrington Challenger.  Ms. Tobie Poet would like to know if she has to bring her mother in for an office visit or will Dr. Harrington Challenger approve the 90 day prescription.

## 2021-11-25 NOTE — Telephone Encounter (Signed)
Pt is overdue for an appt with Dr. Harrington Challenger. Pt medication was sent to pharmacy for a 30 day supply asking pt to make overdue appt. Pt's daughter was concern, because pt is 88 years old, would Dr. Harrington Challenger refill medication for a 90 day supply or does pt need to make an appt first, whether come in the office or virtual appt or telephone? Please address

## 2021-11-30 DIAGNOSIS — D638 Anemia in other chronic diseases classified elsewhere: Secondary | ICD-10-CM | POA: Diagnosis not present

## 2021-11-30 DIAGNOSIS — N185 Chronic kidney disease, stage 5: Secondary | ICD-10-CM | POA: Diagnosis not present

## 2021-11-30 DIAGNOSIS — I129 Hypertensive chronic kidney disease with stage 1 through stage 4 chronic kidney disease, or unspecified chronic kidney disease: Secondary | ICD-10-CM | POA: Diagnosis not present

## 2021-11-30 DIAGNOSIS — E8722 Chronic metabolic acidosis: Secondary | ICD-10-CM | POA: Diagnosis not present

## 2021-11-30 DIAGNOSIS — E211 Secondary hyperparathyroidism, not elsewhere classified: Secondary | ICD-10-CM | POA: Diagnosis not present

## 2021-11-30 DIAGNOSIS — R808 Other proteinuria: Secondary | ICD-10-CM | POA: Diagnosis not present

## 2021-11-30 DIAGNOSIS — N19 Unspecified kidney failure: Secondary | ICD-10-CM | POA: Diagnosis not present

## 2021-12-02 DIAGNOSIS — I2510 Atherosclerotic heart disease of native coronary artery without angina pectoris: Secondary | ICD-10-CM | POA: Diagnosis not present

## 2021-12-02 DIAGNOSIS — I120 Hypertensive chronic kidney disease with stage 5 chronic kidney disease or end stage renal disease: Secondary | ICD-10-CM | POA: Diagnosis not present

## 2021-12-02 DIAGNOSIS — N2581 Secondary hyperparathyroidism of renal origin: Secondary | ICD-10-CM | POA: Diagnosis not present

## 2021-12-02 DIAGNOSIS — D631 Anemia in chronic kidney disease: Secondary | ICD-10-CM | POA: Diagnosis not present

## 2021-12-02 DIAGNOSIS — N185 Chronic kidney disease, stage 5: Secondary | ICD-10-CM | POA: Diagnosis not present

## 2021-12-02 DIAGNOSIS — R809 Proteinuria, unspecified: Secondary | ICD-10-CM | POA: Diagnosis not present

## 2021-12-08 DIAGNOSIS — N2581 Secondary hyperparathyroidism of renal origin: Secondary | ICD-10-CM | POA: Diagnosis not present

## 2021-12-08 DIAGNOSIS — D631 Anemia in chronic kidney disease: Secondary | ICD-10-CM | POA: Diagnosis not present

## 2021-12-08 DIAGNOSIS — N185 Chronic kidney disease, stage 5: Secondary | ICD-10-CM | POA: Diagnosis not present

## 2021-12-08 DIAGNOSIS — I120 Hypertensive chronic kidney disease with stage 5 chronic kidney disease or end stage renal disease: Secondary | ICD-10-CM | POA: Diagnosis not present

## 2021-12-08 DIAGNOSIS — R809 Proteinuria, unspecified: Secondary | ICD-10-CM | POA: Diagnosis not present

## 2021-12-08 DIAGNOSIS — I2510 Atherosclerotic heart disease of native coronary artery without angina pectoris: Secondary | ICD-10-CM | POA: Diagnosis not present

## 2021-12-13 ENCOUNTER — Other Ambulatory Visit: Payer: Self-pay

## 2021-12-13 ENCOUNTER — Encounter: Payer: Self-pay | Admitting: Internal Medicine

## 2021-12-13 MED ORDER — METOPROLOL SUCCINATE ER 50 MG PO TB24: 50.0000 mg | ORAL_TABLET | Freq: Two times a day (BID) | ORAL | 0 refills | Status: DC

## 2021-12-13 MED ORDER — HYDRALAZINE HCL 10 MG PO TABS: 10.0000 mg | ORAL_TABLET | Freq: Two times a day (BID) | ORAL | 3 refills | Status: DC

## 2021-12-13 MED ORDER — SIMVASTATIN 10 MG PO TABS: 10.0000 mg | ORAL_TABLET | Freq: Every day | ORAL | 2 refills | Status: DC

## 2021-12-13 MED ORDER — AMLODIPINE BESYLATE 5 MG PO TABS: ORAL_TABLET | ORAL | 2 refills | Status: DC

## 2021-12-13 NOTE — Telephone Encounter (Signed)
OK to refill meds  Visit to kidney doctor will be helpful as they will check BP and probably labs  I can see as televisit later this fall

## 2021-12-19 NOTE — Telephone Encounter (Signed)
Carly [name], Carly [name] are scheduled for a virtual visit with your provider today.    Just as we do with appointments in the office, we must obtain your consent to participate.  Your consent will be active for this visit and any virtual visit you may have with one of our providers in the next 365 days.    If you have a MyChart account, I can also send a copy of this consent to you electronically.  All virtual visits are billed to your insurance company just like a traditional visit in the office.  As this is a virtual visit, video technology does not allow for your provider to perform a traditional examination.  This may limit your provider's ability to fully assess your condition.  If your provider identifies any concerns that need to be evaluated in person or the need to arrange testing such as labs, EKG, etc, we will make arrangements to do so.    Although advances in technology are sophisticated, we cannot ensure that it will always work on either your end or our end.  If the connection with a video visit is poor, we may have to switch to a telephone visit.  With either a video or telephone visit, we are not always able to ensure that we have a secure connection.   I need to obtain your verbal consent now.   Are you willing to proceed with your visit today? YES   Carly [name] has provided verbal consent on 12/19/2021 for a virtual visit (video or telephone).

## 2021-12-27 DIAGNOSIS — N185 Chronic kidney disease, stage 5: Secondary | ICD-10-CM | POA: Diagnosis not present

## 2021-12-27 DIAGNOSIS — D638 Anemia in other chronic diseases classified elsewhere: Secondary | ICD-10-CM | POA: Diagnosis not present

## 2021-12-27 DIAGNOSIS — M6281 Muscle weakness (generalized): Secondary | ICD-10-CM | POA: Diagnosis not present

## 2021-12-27 DIAGNOSIS — R600 Localized edema: Secondary | ICD-10-CM | POA: Diagnosis not present

## 2021-12-27 DIAGNOSIS — I10 Essential (primary) hypertension: Secondary | ICD-10-CM | POA: Diagnosis not present

## 2022-01-20 DIAGNOSIS — Z7982 Long term (current) use of aspirin: Secondary | ICD-10-CM | POA: Diagnosis not present

## 2022-01-20 DIAGNOSIS — N185 Chronic kidney disease, stage 5: Secondary | ICD-10-CM | POA: Diagnosis not present

## 2022-01-20 DIAGNOSIS — D631 Anemia in chronic kidney disease: Secondary | ICD-10-CM | POA: Diagnosis not present

## 2022-01-20 DIAGNOSIS — R2681 Unsteadiness on feet: Secondary | ICD-10-CM | POA: Diagnosis not present

## 2022-01-20 DIAGNOSIS — Z9181 History of falling: Secondary | ICD-10-CM | POA: Diagnosis not present

## 2022-01-20 DIAGNOSIS — M159 Polyosteoarthritis, unspecified: Secondary | ICD-10-CM | POA: Diagnosis not present

## 2022-01-20 DIAGNOSIS — Z79810 Long term (current) use of selective estrogen receptor modulators (SERMs): Secondary | ICD-10-CM | POA: Diagnosis not present

## 2022-01-20 DIAGNOSIS — I120 Hypertensive chronic kidney disease with stage 5 chronic kidney disease or end stage renal disease: Secondary | ICD-10-CM | POA: Diagnosis not present

## 2022-01-20 DIAGNOSIS — R600 Localized edema: Secondary | ICD-10-CM | POA: Diagnosis not present

## 2022-01-20 DIAGNOSIS — M6281 Muscle weakness (generalized): Secondary | ICD-10-CM | POA: Diagnosis not present

## 2022-01-24 DIAGNOSIS — R600 Localized edema: Secondary | ICD-10-CM | POA: Diagnosis not present

## 2022-01-24 DIAGNOSIS — I120 Hypertensive chronic kidney disease with stage 5 chronic kidney disease or end stage renal disease: Secondary | ICD-10-CM | POA: Diagnosis not present

## 2022-01-24 DIAGNOSIS — M159 Polyosteoarthritis, unspecified: Secondary | ICD-10-CM | POA: Diagnosis not present

## 2022-01-24 DIAGNOSIS — N185 Chronic kidney disease, stage 5: Secondary | ICD-10-CM | POA: Diagnosis not present

## 2022-01-24 DIAGNOSIS — M6281 Muscle weakness (generalized): Secondary | ICD-10-CM | POA: Diagnosis not present

## 2022-01-24 DIAGNOSIS — D631 Anemia in chronic kidney disease: Secondary | ICD-10-CM | POA: Diagnosis not present

## 2022-01-27 DIAGNOSIS — M6281 Muscle weakness (generalized): Secondary | ICD-10-CM | POA: Diagnosis not present

## 2022-01-27 DIAGNOSIS — I120 Hypertensive chronic kidney disease with stage 5 chronic kidney disease or end stage renal disease: Secondary | ICD-10-CM | POA: Diagnosis not present

## 2022-01-27 DIAGNOSIS — R600 Localized edema: Secondary | ICD-10-CM | POA: Diagnosis not present

## 2022-01-27 DIAGNOSIS — M159 Polyosteoarthritis, unspecified: Secondary | ICD-10-CM | POA: Diagnosis not present

## 2022-01-27 DIAGNOSIS — N185 Chronic kidney disease, stage 5: Secondary | ICD-10-CM | POA: Diagnosis not present

## 2022-01-27 DIAGNOSIS — D631 Anemia in chronic kidney disease: Secondary | ICD-10-CM | POA: Diagnosis not present

## 2022-01-28 DIAGNOSIS — Z23 Encounter for immunization: Secondary | ICD-10-CM | POA: Diagnosis not present

## 2022-02-14 ENCOUNTER — Encounter: Payer: Self-pay | Admitting: Internal Medicine

## 2022-02-14 ENCOUNTER — Ambulatory Visit: Payer: Medicare Other | Attending: Internal Medicine | Admitting: Internal Medicine

## 2022-02-14 ENCOUNTER — Telehealth: Payer: Self-pay

## 2022-02-14 VITALS — BP 126/71 | HR 63 | Wt 116.0 lb

## 2022-02-14 DIAGNOSIS — I2510 Atherosclerotic heart disease of native coronary artery without angina pectoris: Secondary | ICD-10-CM | POA: Diagnosis not present

## 2022-02-14 NOTE — Telephone Encounter (Signed)
  Patient Consent for Virtual Visit        Carly [name] has provided verbal consent on 02/14/2022 for a virtual visit (video or telephone).   CONSENT FOR VIRTUAL VISIT FOR:  Carly [name]  By participating in this virtual visit I agree to the following:  I hereby voluntarily request, consent and authorize [facility] and its employed or contracted physicians, physician assistants, nurse practitioners or other licensed health care professionals (the Practitioner), to provide me with telemedicine health care services (the "Services") as deemed necessary by the treating Practitioner. I acknowledge and consent to receive the Services by the Practitioner via telemedicine. I understand that the telemedicine visit will involve communicating with the Practitioner through live audiovisual communication technology and the disclosure of certain medical information by electronic transmission. I acknowledge that I have been given the opportunity to request an in-person assessment or other available alternative prior to the telemedicine visit and am voluntarily participating in the telemedicine visit.  I understand that I have the right to withhold or withdraw my consent to the use of telemedicine in the course of my care at any time, without affecting my right to future care or treatment, and that the Practitioner or I may terminate the telemedicine visit at any time. I understand that I have the right to inspect all information obtained and/or recorded in the course of the telemedicine visit and may receive copies of available information for a reasonable fee.  I understand that some of the potential risks of receiving the Services via telemedicine include:  Delay or interruption in medical evaluation due to technological equipment failure or disruption; Information transmitted may not be sufficient (e.g. poor resolution of images) to allow for appropriate medical decision making by the  Practitioner; and/or  In rare instances, security protocols could fail, causing a breach of personal health information.  Furthermore, I acknowledge that it is my responsibility to provide information about my medical history, conditions and care that is complete and accurate to the best of my ability. I acknowledge that Practitioner's advice, recommendations, and/or decision may be based on factors not within their control, such as incomplete or inaccurate data provided by me or distortions of diagnostic images or specimens that may result from electronic transmissions. I understand that the practice of medicine is not an exact science and that Practitioner makes no warranties or guarantees regarding treatment outcomes. I acknowledge that a copy of this consent can be made available to me via my patient portal ([facility]), or I can request a printed copy by calling the office of [facility].    I understand that my insurance will be billed for this visit.   I have read or had this consent read to me. I understand the contents of this consent, which adequately explains the benefits and risks of the Services being provided via telemedicine.  I have been provided ample opportunity to ask questions regarding this consent and the Services and have had my questions answered to my satisfaction. I give my informed consent for the services to be provided through the use of telemedicine in my medical care

## 2022-02-14 NOTE — Progress Notes (Signed)
Virtual Visit via Telephone Note   Because of Carly [name]'s co-morbid illnesses, she is at least at moderate risk for complications without adequate follow up.  This format is felt to be most appropriate for this patient at this time.  The patient did not have access to video technology/had technical difficulties with video requiring transitioning to audio format only (telephone).  All issues noted in this document were discussed and addressed.  No physical exam could be performed with this format.  Please refer to the patient's chart for her consent to telehealth for [facility].    Date:  02/28/2022   ID:  Carly [name], DOB 1918/09/18, MRN [id number] The patient was identified using 2 identifiers.  Patient Location: Home Provider Location: Office/Clinic   PCP:  Celene Squibb, MD      Evaluation Performed:  Follow-Up Visit  Chief Complaint:  Follow up of CAD   History of Present Illness:    Carly [name] is a 88 y.o. femalewith a history of CAD  (s/p PTCA/Stet to the LAD and PTCA to ramus in 2003)   Sestamibi in past was negative for ischemia   Pt also with Hx of HTN, HL and CKD      I saw the pt in clinic in Aug 2022    She lives with family  Does not get out of house much  Weak.   Uses wheelchair Denies CP  Breathing is OK   Eating   Sleeping    Denies dizziness  No palpitaitons   Past Medical History:  Diagnosis Date   CAD (coronary artery disease)    a. s/p PTCA/stent to LAD and PTCA of ramus in 2003. b. normal nuc 10/2012 with EF 87%   Carotid arterial disease ([place])    a. 07/2014 - duplex 12-87% RICA, 8-67% LICA.   CKD (chronic kidney disease), stage III (HCC)    Cystocele    Dyslipidemia    Gout    HTN (hypertension)    Transitional cell carcinoma ([place])    a. 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   Vaginal wall prolapse    Past Surgical History:  Procedure Laterality Date   APPENDECTOMY      CORONARY ANGIOPLASTY WITH STENT PLACEMENT     NEPHRECTOMY     right     Current Meds  Medication Sig   acetaminophen (TYLENOL) 500 MG tablet Take 500 mg by mouth every 6 (six) hours as needed for mild pain.   amLODipine (NORVASC) 5 MG tablet TAKE (1) TABLET BY MOUTH ONCE DAILY.   aspirin 81 MG tablet Take 81 mg by mouth daily.   calcitRIOL (ROCALTROL) 0.25 MCG capsule Take 0.25 mcg by mouth 3 (three) times a week.   furosemide (LASIX) 20 MG tablet Take 40 mg by mouth daily.   metoprolol succinate (TOPROL-XL) 50 MG 24 hr tablet Take 1 tablet (50 mg total) by mouth in the morning and at bedtime. Please call to schedule an overdue appointment with Dr. Harrington Challenger for refills, [phone], thank you. 1st attempt.   nitroGLYCERIN (NITROSTAT) 0.4 MG SL tablet Place 1 tablet (0.4 mg total) under the tongue every 5 (five) minutes as needed for chest pain.   raloxifene (EVISTA) 60 MG tablet Take 60 mg by mouth daily.   simvastatin (ZOCOR) 10 MG tablet Take 1 tablet (10 mg total) by mouth daily at 6 PM.   sodium bicarbonate 650 MG tablet Take 650 mg  by mouth 2 (two) times daily.     Allergies:   Patient has no known allergies.   Social History   Tobacco Use   Smoking status: Never   Smokeless tobacco: Never  Vaping Use   Vaping Use: Never used  Substance Use Topics   Alcohol use: No   Drug use: No     Family Hx: The patient's family history includes Cancer in her unknown relative; Diabetes in her father; Heart attack in her father; Heart disease in her unknown relative.  ROS:   Please see the history of present illness.     All other systems reviewed and are negative.     Labs/Other Tests and Data Reviewed:    EKG:  No EKG  Recent Labs: 08/16/2021: BUN 87; Creatinine, Ser 3.43; Hemoglobin 11.0; Platelets 184; Potassium 4.1; Sodium 137   Recent Lipid Panel Lab Results  Component Value Date/Time   CHOL 160 02/09/2020 04:54 PM   TRIG 111 02/09/2020 04:54 PM   HDL 72 02/09/2020  04:54 PM   CHOLHDL 2.2 02/09/2020 04:54 PM   CHOLHDL 2.6 05/20/2015 12:23 PM   LDLCALC 68 02/09/2020 04:54 PM   LDLDIRECT 72.6 11/10/2013 09:49 AM    Wt Readings from Last 3 Encounters:  02/14/22 116 lb (52.6 kg)  11/23/20 117 lb 3.2 oz (53.2 kg)  04/19/20 118 lb (53.5 kg)        Objective:    Vital Signs:  BP 126/71 (BP Location: Right Arm, Patient Position: Sitting, Cuff Size: Normal)   Pulse 63   Wt 116 lb (52.6 kg)   BMI 21.56 kg/m    Exam not done as pt at home   Too hard to get out     ASSESSMENT & PLAN:    CAD  No symptoms to suggest angina   Follow   2  HTN  BP is  well controlled     3  HL  Continue simvistatin   Will try to get labs drawn at home   Contact palliative care              Time:   Today, I have spent 15 minutes with the patient with telehealth technology discussing the above problems.     Medication Adjustments/Labs and Tests Ordered: Current medicines are reviewed at length with the patient today.  Concerns regarding medicines are outlined above.      Medication Changes: No orders of the defined types were placed in this encounter.   Follow Up:  Virtual Visit   TBD  Signed, Dorris Carnes, MD  02/28/2022 11:42 PM    [place]

## 2022-02-14 NOTE — Patient Instructions (Signed)
Medication Instructions:   *If you need a refill on your cardiac medications before your next appointment, please call your pharmacy*   Lab Work:  If you have labs (blood work) drawn today and your tests are completely normal, you will receive your results only by: MyChart Message (if you have MyChart) OR A paper copy in the mail If you have any lab test that is abnormal or we need to change your treatment, we will call you to review the results.   Testing/Procedures:    Follow-Up: At [facility], you and your health needs are our priority.  As part of our continuing mission to provide you with exceptional heart care, we have created designated Provider Care Teams.  These Care Teams include your primary Cardiologist (physician) and Advanced Practice Providers (APPs -  Physician Assistants and Nurse Practitioners) who all work together to provide you with the care you need, when you need it.  We recommend signing up for the patient portal called "MyChart".  Sign up information is provided on this After Visit Summary.  MyChart is used to connect with patients for Virtual Visits (Telemedicine).  Patients are able to view lab/test results, encounter notes, upcoming appointments, etc.  Non-urgent messages can be sent to your provider as well.   To learn more about what you can do with MyChart, go to [email or web address].    Important Information About Sugar       

## 2022-03-08 DIAGNOSIS — J069 Acute upper respiratory infection, unspecified: Secondary | ICD-10-CM | POA: Diagnosis not present

## 2022-03-08 DIAGNOSIS — R059 Cough, unspecified: Secondary | ICD-10-CM | POA: Diagnosis not present

## 2022-03-22 ENCOUNTER — Other Ambulatory Visit: Payer: Self-pay | Admitting: Internal Medicine

## 2022-04-19 DIAGNOSIS — N185 Chronic kidney disease, stage 5: Secondary | ICD-10-CM | POA: Diagnosis not present

## 2022-04-19 DIAGNOSIS — R808 Other proteinuria: Secondary | ICD-10-CM | POA: Diagnosis not present

## 2022-04-19 DIAGNOSIS — D638 Anemia in other chronic diseases classified elsewhere: Secondary | ICD-10-CM | POA: Diagnosis not present

## 2022-04-19 DIAGNOSIS — E211 Secondary hyperparathyroidism, not elsewhere classified: Secondary | ICD-10-CM | POA: Diagnosis not present

## 2022-04-19 DIAGNOSIS — E8722 Chronic metabolic acidosis: Secondary | ICD-10-CM | POA: Diagnosis not present

## 2022-04-19 DIAGNOSIS — I129 Hypertensive chronic kidney disease with stage 1 through stage 4 chronic kidney disease, or unspecified chronic kidney disease: Secondary | ICD-10-CM | POA: Diagnosis not present

## 2022-04-26 DIAGNOSIS — M1030 Gout due to renal impairment, unspecified site: Secondary | ICD-10-CM | POA: Diagnosis not present

## 2022-04-26 DIAGNOSIS — Z9181 History of falling: Secondary | ICD-10-CM | POA: Diagnosis not present

## 2022-04-26 DIAGNOSIS — E638 Other specified nutritional deficiencies: Secondary | ICD-10-CM | POA: Diagnosis not present

## 2022-04-26 DIAGNOSIS — E211 Secondary hyperparathyroidism, not elsewhere classified: Secondary | ICD-10-CM | POA: Diagnosis not present

## 2022-04-26 DIAGNOSIS — Z905 Acquired absence of kidney: Secondary | ICD-10-CM | POA: Diagnosis not present

## 2022-04-26 DIAGNOSIS — I2510 Atherosclerotic heart disease of native coronary artery without angina pectoris: Secondary | ICD-10-CM | POA: Diagnosis not present

## 2022-04-26 DIAGNOSIS — D638 Anemia in other chronic diseases classified elsewhere: Secondary | ICD-10-CM | POA: Diagnosis not present

## 2022-04-26 DIAGNOSIS — E8722 Chronic metabolic acidosis: Secondary | ICD-10-CM | POA: Diagnosis not present

## 2022-04-26 DIAGNOSIS — E559 Vitamin D deficiency, unspecified: Secondary | ICD-10-CM | POA: Diagnosis not present

## 2022-04-26 DIAGNOSIS — E7849 Other hyperlipidemia: Secondary | ICD-10-CM | POA: Diagnosis not present

## 2022-04-26 DIAGNOSIS — D631 Anemia in chronic kidney disease: Secondary | ICD-10-CM | POA: Diagnosis not present

## 2022-04-26 DIAGNOSIS — Z7982 Long term (current) use of aspirin: Secondary | ICD-10-CM | POA: Diagnosis not present

## 2022-04-26 DIAGNOSIS — N2581 Secondary hyperparathyroidism of renal origin: Secondary | ICD-10-CM | POA: Diagnosis not present

## 2022-04-26 DIAGNOSIS — N185 Chronic kidney disease, stage 5: Secondary | ICD-10-CM | POA: Diagnosis not present

## 2022-04-26 DIAGNOSIS — R809 Proteinuria, unspecified: Secondary | ICD-10-CM | POA: Diagnosis not present

## 2022-04-26 DIAGNOSIS — R808 Other proteinuria: Secondary | ICD-10-CM | POA: Diagnosis not present

## 2022-04-26 DIAGNOSIS — N19 Unspecified kidney failure: Secondary | ICD-10-CM | POA: Diagnosis not present

## 2022-04-26 DIAGNOSIS — I129 Hypertensive chronic kidney disease with stage 1 through stage 4 chronic kidney disease, or unspecified chronic kidney disease: Secondary | ICD-10-CM | POA: Diagnosis not present

## 2022-04-26 DIAGNOSIS — I120 Hypertensive chronic kidney disease with stage 5 chronic kidney disease or end stage renal disease: Secondary | ICD-10-CM | POA: Diagnosis not present

## 2022-05-02 DIAGNOSIS — I2510 Atherosclerotic heart disease of native coronary artery without angina pectoris: Secondary | ICD-10-CM | POA: Diagnosis not present

## 2022-05-02 DIAGNOSIS — D631 Anemia in chronic kidney disease: Secondary | ICD-10-CM | POA: Diagnosis not present

## 2022-05-02 DIAGNOSIS — E211 Secondary hyperparathyroidism, not elsewhere classified: Secondary | ICD-10-CM | POA: Diagnosis not present

## 2022-05-02 DIAGNOSIS — I129 Hypertensive chronic kidney disease with stage 1 through stage 4 chronic kidney disease, or unspecified chronic kidney disease: Secondary | ICD-10-CM | POA: Diagnosis not present

## 2022-05-02 DIAGNOSIS — R808 Other proteinuria: Secondary | ICD-10-CM | POA: Diagnosis not present

## 2022-05-02 DIAGNOSIS — M1030 Gout due to renal impairment, unspecified site: Secondary | ICD-10-CM | POA: Diagnosis not present

## 2022-05-02 DIAGNOSIS — N2581 Secondary hyperparathyroidism of renal origin: Secondary | ICD-10-CM | POA: Diagnosis not present

## 2022-05-02 DIAGNOSIS — I120 Hypertensive chronic kidney disease with stage 5 chronic kidney disease or end stage renal disease: Secondary | ICD-10-CM | POA: Diagnosis not present

## 2022-05-02 DIAGNOSIS — N185 Chronic kidney disease, stage 5: Secondary | ICD-10-CM | POA: Diagnosis not present

## 2022-05-02 DIAGNOSIS — E8722 Chronic metabolic acidosis: Secondary | ICD-10-CM | POA: Diagnosis not present

## 2022-05-10 DIAGNOSIS — I2510 Atherosclerotic heart disease of native coronary artery without angina pectoris: Secondary | ICD-10-CM | POA: Diagnosis not present

## 2022-05-10 DIAGNOSIS — N2581 Secondary hyperparathyroidism of renal origin: Secondary | ICD-10-CM | POA: Diagnosis not present

## 2022-05-10 DIAGNOSIS — I120 Hypertensive chronic kidney disease with stage 5 chronic kidney disease or end stage renal disease: Secondary | ICD-10-CM | POA: Diagnosis not present

## 2022-05-10 DIAGNOSIS — N185 Chronic kidney disease, stage 5: Secondary | ICD-10-CM | POA: Diagnosis not present

## 2022-05-10 DIAGNOSIS — D631 Anemia in chronic kidney disease: Secondary | ICD-10-CM | POA: Diagnosis not present

## 2022-05-10 DIAGNOSIS — M1030 Gout due to renal impairment, unspecified site: Secondary | ICD-10-CM | POA: Diagnosis not present

## 2022-05-23 ENCOUNTER — Encounter (HOSPITAL_COMMUNITY): Payer: Self-pay

## 2022-05-23 ENCOUNTER — Observation Stay (HOSPITAL_COMMUNITY): Payer: Medicare Other

## 2022-05-23 ENCOUNTER — Emergency Department (HOSPITAL_COMMUNITY): Payer: Medicare Other

## 2022-05-23 ENCOUNTER — Inpatient Hospital Stay (HOSPITAL_COMMUNITY)
Admission: EM | Admit: 2022-05-23 | Discharge: 2022-05-28 | DRG: 291 | Disposition: A | Discharge status: Hospice | Payer: Medicare Other | Attending: Family Medicine | Admitting: Family Medicine

## 2022-05-23 ENCOUNTER — Other Ambulatory Visit: Payer: Self-pay

## 2022-05-23 DIAGNOSIS — R1310 Dysphagia, unspecified: Secondary | ICD-10-CM

## 2022-05-23 DIAGNOSIS — Z1152 Encounter for screening for COVID-19: Secondary | ICD-10-CM | POA: Diagnosis not present

## 2022-05-23 DIAGNOSIS — N179 Acute kidney failure, unspecified: Secondary | ICD-10-CM | POA: Diagnosis not present

## 2022-05-23 DIAGNOSIS — Z85528 Personal history of other malignant neoplasm of kidney: Secondary | ICD-10-CM

## 2022-05-23 DIAGNOSIS — Z833 Family history of diabetes mellitus: Secondary | ICD-10-CM

## 2022-05-23 DIAGNOSIS — J90 Pleural effusion, not elsewhere classified: Secondary | ICD-10-CM | POA: Diagnosis not present

## 2022-05-23 DIAGNOSIS — N189 Chronic kidney disease, unspecified: Secondary | ICD-10-CM

## 2022-05-23 DIAGNOSIS — N2581 Secondary hyperparathyroidism of renal origin: Secondary | ICD-10-CM | POA: Diagnosis not present

## 2022-05-23 DIAGNOSIS — Z8249 Family history of ischemic heart disease and other diseases of the circulatory system: Secondary | ICD-10-CM

## 2022-05-23 DIAGNOSIS — R531 Weakness: Secondary | ICD-10-CM | POA: Diagnosis not present

## 2022-05-23 DIAGNOSIS — M109 Gout, unspecified: Secondary | ICD-10-CM | POA: Diagnosis present

## 2022-05-23 DIAGNOSIS — M6281 Muscle weakness (generalized): Secondary | ICD-10-CM | POA: Diagnosis not present

## 2022-05-23 DIAGNOSIS — R609 Edema, unspecified: Secondary | ICD-10-CM | POA: Diagnosis not present

## 2022-05-23 DIAGNOSIS — E785 Hyperlipidemia, unspecified: Secondary | ICD-10-CM | POA: Diagnosis present

## 2022-05-23 DIAGNOSIS — I2510 Atherosclerotic heart disease of native coronary artery without angina pectoris: Secondary | ICD-10-CM | POA: Diagnosis not present

## 2022-05-23 DIAGNOSIS — M159 Polyosteoarthritis, unspecified: Secondary | ICD-10-CM | POA: Diagnosis not present

## 2022-05-23 DIAGNOSIS — Z7189 Other specified counseling: Secondary | ICD-10-CM | POA: Diagnosis not present

## 2022-05-23 DIAGNOSIS — Z905 Acquired absence of kidney: Secondary | ICD-10-CM

## 2022-05-23 DIAGNOSIS — N185 Chronic kidney disease, stage 5: Secondary | ICD-10-CM | POA: Diagnosis present

## 2022-05-23 DIAGNOSIS — E872 Acidosis, unspecified: Secondary | ICD-10-CM | POA: Diagnosis not present

## 2022-05-23 DIAGNOSIS — Z66 Do not resuscitate: Secondary | ICD-10-CM | POA: Diagnosis present

## 2022-05-23 DIAGNOSIS — I340 Nonrheumatic mitral (valve) insufficiency: Secondary | ICD-10-CM | POA: Diagnosis present

## 2022-05-23 DIAGNOSIS — Z955 Presence of coronary angioplasty implant and graft: Secondary | ICD-10-CM

## 2022-05-23 DIAGNOSIS — I779 Disorder of arteries and arterioles, unspecified: Secondary | ICD-10-CM | POA: Diagnosis present

## 2022-05-23 DIAGNOSIS — I132 Hypertensive heart and chronic kidney disease with heart failure and with stage 5 chronic kidney disease, or end stage renal disease: Principal | ICD-10-CM | POA: Diagnosis present

## 2022-05-23 DIAGNOSIS — I10 Essential (primary) hypertension: Secondary | ICD-10-CM | POA: Diagnosis not present

## 2022-05-23 DIAGNOSIS — R0902 Hypoxemia: Secondary | ICD-10-CM | POA: Diagnosis not present

## 2022-05-23 DIAGNOSIS — E8779 Other fluid overload: Secondary | ICD-10-CM | POA: Diagnosis not present

## 2022-05-23 DIAGNOSIS — Z79899 Other long term (current) drug therapy: Secondary | ICD-10-CM | POA: Diagnosis not present

## 2022-05-23 DIAGNOSIS — R0602 Shortness of breath: Secondary | ICD-10-CM | POA: Diagnosis not present

## 2022-05-23 DIAGNOSIS — Z515 Encounter for palliative care: Secondary | ICD-10-CM | POA: Diagnosis not present

## 2022-05-23 DIAGNOSIS — I129 Hypertensive chronic kidney disease with stage 1 through stage 4 chronic kidney disease, or unspecified chronic kidney disease: Secondary | ICD-10-CM | POA: Diagnosis not present

## 2022-05-23 DIAGNOSIS — I519 Heart disease, unspecified: Secondary | ICD-10-CM | POA: Diagnosis not present

## 2022-05-23 DIAGNOSIS — D509 Iron deficiency anemia, unspecified: Secondary | ICD-10-CM | POA: Diagnosis present

## 2022-05-23 DIAGNOSIS — Z7982 Long term (current) use of aspirin: Secondary | ICD-10-CM

## 2022-05-23 DIAGNOSIS — D631 Anemia in chronic kidney disease: Secondary | ICD-10-CM | POA: Diagnosis not present

## 2022-05-23 DIAGNOSIS — I5033 Acute on chronic diastolic (congestive) heart failure: Secondary | ICD-10-CM | POA: Diagnosis present

## 2022-05-23 DIAGNOSIS — J9601 Acute respiratory failure with hypoxia: Secondary | ICD-10-CM | POA: Diagnosis not present

## 2022-05-23 DIAGNOSIS — I509 Heart failure, unspecified: Secondary | ICD-10-CM

## 2022-05-23 DIAGNOSIS — R0609 Other forms of dyspnea: Secondary | ICD-10-CM | POA: Diagnosis not present

## 2022-05-23 DIAGNOSIS — E46 Unspecified protein-calorie malnutrition: Secondary | ICD-10-CM | POA: Diagnosis not present

## 2022-05-23 DIAGNOSIS — I4891 Unspecified atrial fibrillation: Secondary | ICD-10-CM | POA: Diagnosis not present

## 2022-05-23 DIAGNOSIS — M1030 Gout due to renal impairment, unspecified site: Secondary | ICD-10-CM | POA: Diagnosis not present

## 2022-05-23 DIAGNOSIS — R079 Chest pain, unspecified: Secondary | ICD-10-CM | POA: Diagnosis not present

## 2022-05-23 DIAGNOSIS — R600 Localized edema: Secondary | ICD-10-CM | POA: Diagnosis not present

## 2022-05-23 DIAGNOSIS — I120 Hypertensive chronic kidney disease with stage 5 chronic kidney disease or end stage renal disease: Secondary | ICD-10-CM | POA: Diagnosis not present

## 2022-05-23 DIAGNOSIS — R0600 Dyspnea, unspecified: Principal | ICD-10-CM

## 2022-05-23 LAB — CBC
HCT: 32.4 % — ABNORMAL LOW (ref 36.0–46.0)
Hemoglobin: 10.2 g/dL — ABNORMAL LOW (ref 12.0–15.0)
MCH: 30.9 pg (ref 26.0–34.0)
MCHC: 31.5 g/dL (ref 30.0–36.0)
MCV: 98.2 fL (ref 80.0–100.0)
Platelets: 206 10*3/uL (ref 150–400)
RBC: 3.3 MIL/uL — ABNORMAL LOW (ref 3.87–5.11)
RDW: 15.1 % (ref 11.5–15.5)
WBC: 10.2 10*3/uL (ref 4.0–10.5)
nRBC: 0 % (ref 0.0–0.2)

## 2022-05-23 LAB — COMPREHENSIVE METABOLIC PANEL
ALT: 15 U/L (ref 0–44)
AST: 20 U/L (ref 15–41)
Albumin: 3.6 g/dL (ref 3.5–5.0)
Alkaline Phosphatase: 47 U/L (ref 38–126)
Anion gap: 12 (ref 5–15)
BUN: 96 mg/dL — ABNORMAL HIGH (ref 8–23)
CO2: 20 mmol/L — ABNORMAL LOW (ref 22–32)
Calcium: 8.5 mg/dL — ABNORMAL LOW (ref 8.9–10.3)
Chloride: 108 mmol/L (ref 98–111)
Creatinine, Ser: 3.73 mg/dL — ABNORMAL HIGH (ref 0.44–1.00)
GFR, Estimated: 10 mL/min — ABNORMAL LOW (ref >60)
Glucose, Bld: 123 mg/dL — ABNORMAL HIGH (ref 70–99)
Potassium: 4.6 mmol/L (ref 3.5–5.1)
Sodium: 140 mmol/L (ref 135–145)
Total Bilirubin: 0.6 mg/dL (ref 0.3–1.2)
Total Protein: 6.7 g/dL (ref 6.5–8.1)

## 2022-05-23 LAB — TROPONIN I (HIGH SENSITIVITY)
Troponin I (High Sensitivity): 10 ng/L (ref <18)
Troponin I (High Sensitivity): 11 ng/L (ref <18)

## 2022-05-23 LAB — RESP PANEL BY RT-PCR (RSV, FLU A&B, COVID)  RVPGX2
Influenza A by PCR: NEGATIVE
Influenza B by PCR: NEGATIVE
Resp Syncytial Virus by PCR: NEGATIVE
SARS Coronavirus 2 by RT PCR: NEGATIVE

## 2022-05-23 LAB — BRAIN NATRIURETIC PEPTIDE: B Natriuretic Peptide: 877 pg/mL — ABNORMAL HIGH (ref 0.0–100.0)

## 2022-05-23 MED ORDER — SODIUM BICARBONATE 650 MG PO TABS
650.0000 mg | ORAL_TABLET | Freq: Two times a day (BID) | ORAL | Status: DC
  Administered 2022-05-23 – 2022-05-28 (×10): 650 mg via ORAL
  Filled 2022-05-23 (×10): qty 1

## 2022-05-23 MED ORDER — AMLODIPINE BESYLATE 5 MG PO TABS
10.0000 mg | ORAL_TABLET | Freq: Every day | ORAL | Status: DC
  Administered 2022-05-23: 10 mg via ORAL
  Filled 2022-05-23: qty 2

## 2022-05-23 MED ORDER — IPRATROPIUM-ALBUTEROL 0.5-2.5 (3) MG/3ML IN SOLN: 3.0000 mL | Freq: Three times a day (TID) | RESPIRATORY_TRACT | Status: DC

## 2022-05-23 MED ORDER — TRAZODONE HCL 50 MG PO TABS
50.0000 mg | ORAL_TABLET | Freq: Every evening | ORAL | Status: DC | PRN
  Administered 2022-05-23: 50 mg via ORAL
  Filled 2022-05-23 (×2): qty 1

## 2022-05-23 MED ORDER — HYDRALAZINE HCL 25 MG PO TABS
50.0000 mg | ORAL_TABLET | Freq: Three times a day (TID) | ORAL | Status: DC
  Filled 2022-05-23: qty 2

## 2022-05-23 MED ORDER — HYDRALAZINE HCL 20 MG/ML IJ SOLN: 10.0000 mg | Freq: Four times a day (QID) | INTRAMUSCULAR | Status: DC | PRN

## 2022-05-23 MED ORDER — ALBUTEROL SULFATE (2.5 MG/3ML) 0.083% IN NEBU: 2.5000 mg | INHALATION_SOLUTION | RESPIRATORY_TRACT | Status: DC | PRN

## 2022-05-23 MED ORDER — ACETAMINOPHEN 650 MG RE SUPP: 650.0000 mg | Freq: Four times a day (QID) | RECTAL | Status: DC | PRN

## 2022-05-23 MED ORDER — FUROSEMIDE 10 MG/ML IJ SOLN
60.0000 mg | Freq: Every day | INTRAMUSCULAR | Status: DC
  Administered 2022-05-24: 60 mg via INTRAVENOUS
  Filled 2022-05-23: qty 6

## 2022-05-23 MED ORDER — SODIUM CHLORIDE 0.9 % IV SOLN: INTRAVENOUS | Status: DC | PRN

## 2022-05-23 MED ORDER — BISACODYL 10 MG RE SUPP: 10.0000 mg | Freq: Every day | RECTAL | Status: DC | PRN

## 2022-05-23 MED ORDER — HYDRALAZINE HCL 25 MG PO TABS
25.0000 mg | ORAL_TABLET | ORAL | Status: AC
  Administered 2022-05-23: 25 mg via ORAL
  Filled 2022-05-23: qty 1

## 2022-05-23 MED ORDER — ONDANSETRON HCL 4 MG PO TABS: 4.0000 mg | ORAL_TABLET | Freq: Four times a day (QID) | ORAL | Status: DC | PRN

## 2022-05-23 MED ORDER — SODIUM CHLORIDE 0.9% FLUSH
3.0000 mL | Freq: Two times a day (BID) | INTRAVENOUS | Status: DC
  Administered 2022-05-25 – 2022-05-28 (×6): 3 mL via INTRAVENOUS

## 2022-05-23 MED ORDER — ACETAMINOPHEN 325 MG PO TABS
650.0000 mg | ORAL_TABLET | Freq: Four times a day (QID) | ORAL | Status: DC | PRN
  Administered 2022-05-23: 650 mg via ORAL
  Filled 2022-05-23: qty 2

## 2022-05-23 MED ORDER — POLYETHYLENE GLYCOL 3350 17 G PO PACK: 17.0000 g | PACK | Freq: Every day | ORAL | Status: DC | PRN

## 2022-05-23 MED ORDER — SODIUM CHLORIDE 0.9% FLUSH
3.0000 mL | Freq: Two times a day (BID) | INTRAVENOUS | Status: DC
  Administered 2022-05-23 – 2022-05-25 (×5): 3 mL via INTRAVENOUS

## 2022-05-23 MED ORDER — ASPIRIN 81 MG PO TBEC
81.0000 mg | DELAYED_RELEASE_TABLET | Freq: Every day | ORAL | Status: DC
  Administered 2022-05-23 – 2022-05-24 (×2): 81 mg via ORAL
  Filled 2022-05-23 (×4): qty 1

## 2022-05-23 MED ORDER — ONDANSETRON HCL 4 MG/2ML IJ SOLN
4.0000 mg | Freq: Four times a day (QID) | INTRAMUSCULAR | Status: DC | PRN
  Administered 2022-05-23: 4 mg via INTRAVENOUS
  Filled 2022-05-23: qty 2

## 2022-05-23 MED ORDER — METOPROLOL SUCCINATE ER 50 MG PO TB24
50.0000 mg | ORAL_TABLET | Freq: Two times a day (BID) | ORAL | Status: DC
  Administered 2022-05-23 – 2022-05-28 (×10): 50 mg via ORAL
  Filled 2022-05-23 (×10): qty 1

## 2022-05-23 MED ORDER — METOPROLOL SUCCINATE ER 50 MG PO TB24
50.0000 mg | ORAL_TABLET | Freq: Every day | ORAL | Status: DC
  Administered 2022-05-23: 50 mg via ORAL
  Filled 2022-05-23: qty 1

## 2022-05-23 MED ORDER — HEPARIN SODIUM (PORCINE) 5000 UNIT/ML IJ SOLN
5000.0000 [IU] | Freq: Three times a day (TID) | INTRAMUSCULAR | Status: DC
  Filled 2022-05-23 (×2): qty 1

## 2022-05-23 MED ORDER — CALCIUM CARBONATE ANTACID 500 MG PO CHEW
1.0000 | CHEWABLE_TABLET | Freq: Every day | ORAL | Status: DC
  Administered 2022-05-23 – 2022-05-28 (×6): 200 mg via ORAL
  Filled 2022-05-23 (×6): qty 1

## 2022-05-23 MED ORDER — IPRATROPIUM-ALBUTEROL 0.5-2.5 (3) MG/3ML IN SOLN
3.0000 mL | Freq: Four times a day (QID) | RESPIRATORY_TRACT | Status: DC
  Administered 2022-05-23: 3 mL via RESPIRATORY_TRACT
  Filled 2022-05-23: qty 3

## 2022-05-23 MED ORDER — SODIUM CHLORIDE 0.9% FLUSH: 3.0000 mL | INTRAVENOUS | Status: DC | PRN

## 2022-05-23 MED ORDER — FUROSEMIDE 10 MG/ML IJ SOLN
60.0000 mg | INTRAMUSCULAR | Status: AC
  Administered 2022-05-23: 60 mg via INTRAVENOUS
  Filled 2022-05-23: qty 6

## 2022-05-23 NOTE — Progress Notes (Signed)
Attempted peak flow on patient and she is unable to register it.  More technique than lack of effort.  MD notified. Patient has no history of asthma.

## 2022-05-23 NOTE — H&P (Signed)
Patient Demographics:    Carly [name], is a 89 y.o. female  MRN: [id number]   DOB - [birth year]  Admit Date - 05/23/2022  Outpatient Primary MD for the patient is Celene Squibb, MD   Assessment & Plan:   Assessment and Plan:  1) acute hypoxic respiratory failure---??  CHF related in the setting of elevated BP at presentation -Elevated BNP noted at 877 (baseline is not available) -Chest x-ray with tiny pleural effusions possible CHF findings -Chronic leg edema noted -IV Lasix as ordered -Daily weight and fluid input and output monitoring -Check venous Dopplers to rule out DVT- ---if venous Dopplers are positive patient will need a VQ scan..  Not a candidate for CTA chest given renal function -COVID, flu and RSV negative -No productive cough or leukocytosis, no fevers or chills Initial troponin is 10 repeat is 11, EKG sinus without acute changes -In the ED patient required up to 4 L of oxygen via nasal cannula --In ED blood pressure was very elevated at the beginning--- improved with interventions -Patient's family at this time are refusing anticoagulation, await venous Dopplers as above  2) CKD 5--- patient has history of transitional cell carcinoma of the right kidney and she is status post right nephro ureterectomy in May 2027 -Baseline creatinine from Care Everywhere from Dr. Toya Smothers office is 3.2-3.4 -Creatinine up to 3.73 today -Bicarb is 20 -Continue sodium bicarb supplements -Watch for possible worsening renal function with aggressive IV diuresis -As per patient and family she was previously told she is not a candidate for hemodialysis -renally adjust medications, avoid nephrotoxic agents / dehydration  / hypotension   3)Social/Ethics---Discussed with daughters Estill Batten and Windell Hummingbird and granddaughter  Arts development officer -Patient is a DNR/DNI -Please see MOST form completed  4) chronic anemia of CKD--- hemoglobin currently above 10 which is close to recent baseline -No bleeding concerns -  5)CAD (s/p PTCA/Stent to the LAD and PTCA to ramus in 2003)/H/o PAD with known carotid artery stenosis --- Currently chest pain-free. Initial troponin is 10 repeat is 11, EKG sinus without acute changes -Continue aspirin and metoprolol -Hold simvastatin  Dispo: The patient is from: Home              Anticipated d/c is to: Home              Anticipated d/c date is: 1 day              Patient currently is not medically stable to d/c. Barriers: Not Clinically Stable-    With History of - Reviewed by me  Past Medical History:  Diagnosis Date   CAD (coronary artery disease)    a. s/p PTCA/stent to LAD and PTCA of ramus in 2003. b. normal nuc 10/2012 with EF 87%   Carotid arterial disease ([place])    a. 07/2014 - duplex 94-76% RICA, 5-46% LICA.   CKD (chronic kidney disease), stage III (HCC)    Cystocele  Dyslipidemia    Gout    HTN (hypertension)    Transitional cell carcinoma ([place])    a. 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   Vaginal wall prolapse       Past Surgical History:  Procedure Laterality Date   APPENDECTOMY     CORONARY ANGIOPLASTY WITH STENT PLACEMENT     NEPHRECTOMY     right   Chief Complaint  Patient presents with   Shortness of Breath      HPI:    Carly [name]  is a 89 y.o. female past medical history relevant for CKD 5,  history of transitional cell carcinoma of the right kidney and she is status post right nephro ureterectomy in May 2027, -Baseline creatinine from Care Everywhere from Dr. Toya Smothers office is 3.2-3.4, -Creatinine up to 3.73 today,,-Bicarb is 20 , history of hypertension, chronic anemia of CKD ( hemoglobin currently above 10 which is close to recent baseline), h/o CAD (s/p PTCA/Stent to the LAD and  PTCA to ramus in 2003)/H/o PAD with known carotid artery stenosis  -Presents to the ED with sudden onset of shortness of breath this morning after getting up to use the commode---The ED she is found to be hypoxic with O2 sats in the low 80s, requiring 4 L of oxygen via nasal cannula --COVID, flu and RSV negative -No productive cough or leukocytosis, no fevers or chills -- denies frank chest pains, Initial troponin is 10 repeat is 11, EKG sinus without acute changes -Patient with chronic leg swelling from CKD 5 and CHF -Elevated BNP noted at 877 (baseline is not available) -Chest x-ray with tiny pleural effusions possible CHF findings -Check venous Dopplers to rule out DVT  Initial troponin is 10 repeat is 11, EKG sinus without acute changes No fever  Or chills  - In ED blood pressure was very elevated at the beginning--- improved with interventions  No Nausea, Vomiting or Diarrhea  Additional history obtained from daughters [place] and [place] and granddaughter Arts development officer  -   Review of systems:    In addition to the HPI above,   A full Review of  Systems was done, all other systems reviewed are negative except as noted above in HPI , .    Social History:  Reviewed by me    Social History   Tobacco Use   Smoking status: Never   Smokeless tobacco: Never  Substance Use Topics   Alcohol use: No       Family History :  Reviewed by me    Family History  Problem Relation Age of Onset   Heart attack Father    Diabetes Father    Cancer Unknown    Heart disease Unknown     Home Medications:   Prior to Admission medications   Medication Sig Start Date End Date Taking? Authorizing Provider  acetaminophen (TYLENOL) 500 MG tablet Take 500 mg by mouth every 6 (six) hours as needed for mild pain.   Yes [provider]  amLODipine (NORVASC) 5 MG tablet TAKE (1) TABLET BY MOUTH ONCE DAILY. Patient taking differently: Take 10 mg by mouth daily. 12/13/21  Yes [name], MD  aspirin 81 MG tablet Take 81 mg by mouth daily.   Yes [provider]  calcium carbonate (TUMS - DOSED IN MG ELEMENTAL CALCIUM) 500 MG chewable tablet Chew 1 tablet by mouth daily. 1200   Yes [provider]  metoprolol succinate (TOPROL-XL) 50 MG  24 hr tablet Take 1 tablet (50 mg total) by mouth in the morning and at bedtime. Please call to schedule an overdue appointment with Dr. Harrington Challenger for refills, [phone], thank you. 1st attempt. 12/13/21  Yes [name], MD  nitroGLYCERIN (NITROSTAT) 0.4 MG SL tablet Place 1 tablet (0.4 mg total) under the tongue every 5 (five) minutes as needed for chest pain. 02/09/20  Yes [name], MD  raloxifene (EVISTA) 60 MG tablet Take 60 mg by mouth daily.   Yes [provider]  simvastatin (ZOCOR) 10 MG tablet Take 1 tablet (10 mg total) by mouth daily at 6 PM. 12/13/21  Yes [name], MD  calcitRIOL (ROCALTROL) 0.25 MCG capsule Take 0.25 mcg by mouth 3 (three) times a week. 11/17/21   [provider]  cloNIDine (CATAPRES) 0.1 MG tablet Take by mouth daily. Take 1/2 tab as needed Patient not taking: Reported on 02/14/2022    [provider]  furosemide (LASIX) 20 MG tablet Take 40 mg by mouth daily. 07/22/20 02/14/22  [provider]  hydrALAZINE (APRESOLINE) 10 MG tablet Take 1 tablet (10 mg total) by mouth in the morning and at bedtime. Patient not taking: Reported on 02/14/2022 12/13/21   [name], MD     Allergies:    No Known Allergies   Physical Exam:   Vitals  Blood pressure (!) 156/58, pulse 74, temperature (!) 97.5 F (36.4 C), temperature source Oral, resp. rate 18, height 5' 1.5" (1.562 m), weight 52.6 kg, SpO2 96 %.  Physical Examination: General appearance - alert,  in no distress  Mental status - alert, oriented to person, place, and time,  Nose- [place] 2L/min Eyes - sclera anicteric Neck - supple, no JVD elevation , Chest -somewhat diminished breath sounds no significant  rales, no wheezing heart - S1 and S2 normal, regular  Abdomen - soft, nontender, nondistended, +BS Neurological - screening mental status exam normal, neck supple without rigidity, cranial nerves II through XII intact, DTR's normal and symmetric Extremities - +ve 2  pedal edema noted, intact peripheral pulses  Skin - warm, dry     Data Review:    CBC Recent Labs  Lab 05/23/22 1004  WBC 10.2  HGB 10.2*  HCT 32.4*  PLT 206  MCV 98.2  MCH 30.9  MCHC 31.5  RDW 15.1   Chemistries  Recent Labs  Lab 05/23/22 1004  NA 140  K 4.6  CL 108  CO2 20*  GLUCOSE 123*  BUN 96*  CREATININE 3.73*  CALCIUM 8.5*  AST 20  ALT 15  ALKPHOS 47  BILITOT 0.6   ------------------------------------------------------------------------------------------------------------------ estimated creatinine clearance is 6 mL/min (A) (by C-G formula based on SCr of 3.73 mg/dL (H)). ------------------------------------------------------------------------------------------------------------------ -----------------------------------------------------------------------------------------------------------------    Component Value Date/Time   BNP 877.0 (H) 05/23/2022 1004   Urinalysis    Component Value Date/Time   LABSPEC 1.008 10/25/2014 0701   PHURINE 6.0 10/25/2014 0701   GLUCOSEU NEGATIVE 10/25/2014 0701   HGBUR NEGATIVE 10/25/2014 0701   BILIRUBINUR NEGATIVE 10/25/2014 0701   KETONESUR NEGATIVE 10/25/2014 0701   PROTEINUR NEGATIVE 10/25/2014 0701   UROBILINOGEN 0.2 10/25/2014 0701   NITRITE NEGATIVE 10/25/2014 0701   LEUKOCYTESUR SMALL (A) 10/25/2014 0701     Imaging Results:    DG Chest Port 1 View  Result Date: 05/23/2022 CLINICAL DATA:  Shortness of breath. EXAM: PORTABLE CHEST 1 VIEW COMPARISON:  12/08/2017 FINDINGS: Lungs are hyperexpanded. There is some minimal atelectasis or infiltrate at the left  base with tiny bilateral pleural effusions. Interstitial markings are diffusely  coarsened with chronic features. Cardiopericardial silhouette is at upper limits of normal for size. Telemetry leads overlie the chest. IMPRESSION: Hyperexpansion with minimal atelectasis or infiltrate at the left base and tiny bilateral pleural effusions. Electronically Signed   By: Misty Stanley M.D.   On: 05/23/2022 10:20    Radiological Exams on Admission: DG Chest Port 1 View  Result Date: 05/23/2022 CLINICAL DATA:  Shortness of breath. EXAM: PORTABLE CHEST 1 VIEW COMPARISON:  12/08/2017 FINDINGS: Lungs are hyperexpanded. There is some minimal atelectasis or infiltrate at the left base with tiny bilateral pleural effusions. Interstitial markings are diffusely coarsened with chronic features. Cardiopericardial silhouette is at upper limits of normal for size. Telemetry leads overlie the chest. IMPRESSION: Hyperexpansion with minimal atelectasis or infiltrate at the left base and tiny bilateral pleural effusions. Electronically Signed   By: Misty Stanley M.D.   On: 05/23/2022 10:20    DVT Prophylaxis -SCD/Heparin AM Labs Ordered, also please review Full Orders  Family Communication: Admission, patients condition and plan of care including tests being ordered have been discussed with the patient and daughters x 2 and grand-daughter at bedside who indicate understanding and agree with the plan   Condition  -stable  Roxan Hockey M.D on 05/23/2022 at 6:24 PM Go to [email or web address] -  for contact info  [facility] - Office  [phone]

## 2022-05-23 NOTE — ED Notes (Signed)
Patient has difficulty swallowing all pills and even water.  Family reports its been going on for awhile.  Patients pills should probably should be crushed and put in applesauce.

## 2022-05-23 NOTE — Evaluation (Signed)
Clinical/Bedside Swallow Evaluation Patient Details  Name: Carly [name] MRN: [id number] Date of Birth: 25-Aug-1918  Today's Date: 05/23/2022 Time: SLP Start Time (ACUTE ONLY): 63 SLP Stop Time (ACUTE ONLY): 1422 SLP Time Calculation (min) (ACUTE ONLY): 27 min  Past Medical History:  Past Medical History:  Diagnosis Date   CAD (coronary artery disease)    a. s/p PTCA/stent to LAD and PTCA of ramus in 2003. b. normal nuc 10/2012 with EF 87%   Carotid arterial disease ([place])    a. 07/2014 - duplex 99-37% RICA, 1-69% LICA.   CKD (chronic kidney disease), stage III (HCC)    Cystocele    Dyslipidemia    Gout    HTN (hypertension)    Transitional cell carcinoma ([place])    a. 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   Vaginal wall prolapse    Past Surgical History:  Past Surgical History:  Procedure Laterality Date   APPENDECTOMY     CORONARY ANGIOPLASTY WITH STENT PLACEMENT     NEPHRECTOMY     right   HPI:  89 year old female with a history of nephrectomy, CKD not on IHD, volume overload on Lasix, CAD status post PCI, and hypertension who presents to the emergency department with shortness of breath.  History somewhat limited due to patient.  States that she feels well and is not having any pain or shortness of breath at this time.  History obtained per EMS who states that patient's family called 9 because she seemed very short of breath while walking to the restroom.  Reports that her Lasix was recently decreased due to declining renal function that her amlodipine was recently increased. Chest xray shows: Hyperexpansion with minimal atelectasis or infiltrate at the left  base and tiny bilateral pleural effusions. Family reports that Pt has recently had difficulty swallowing pills, water, and food. BSE requested.    Assessment / Plan / Recommendation  Clinical Impression  Clinical swallow evaluation completed in room with daughter  present. Pt's daughter reports that her mother has a history of esophageal dysphagia reports of globus and was previously seen by Dr. Laural Golden, but declined upper endoscopy and controls symptoms by textures modification. Oral motor examination is WNL. Pt consumed ice chips, thin water via cup/straw, puree, and regular textures. Pt without overt signs of symptoms of aspiration. She does not eat most meats, unless finely chopped and avoids raw vegetables/salads due to globus sensation with swallow. Her daughter reports that his has been customary for her for many years, no new changes. Her daughter cuts up some of her larger pills and presents them with water. Pt always sits up at the table for meals at home and is careful to take small bite and eat slowly. Recommend regular textures with chopped meats and cut up or crush pills as able and present in puree with standard aspiration and reflux precautions. No further SLP services indicated at this time. Pt/daughter are in agreement with plan of care. SLP Visit Diagnosis: Dysphagia, unspecified (R13.10)    Aspiration Risk  Mild aspiration risk    Diet Recommendation Regular;Thin liquid (chopped meats)   Liquid Administration via: Cup;Straw Medication Administration: Crushed with puree Supervision: Staff to assist with self feeding;Intermittent supervision to cue for compensatory strategies Compensations: Slow rate;Small sips/bites Postural Changes: Seated upright at 90 degrees;Remain upright for at least 30 minutes after po intake    Other  Recommendations Oral Care Recommendations: Oral care BID;Staff/trained caregiver to provide oral care  Recommendations for follow up therapy are one component of a multi-disciplinary discharge planning process, led by the attending physician.  Recommendations may be updated based on patient status, additional functional criteria and insurance authorization.  Follow up Recommendations No SLP follow up       Assistance Recommended at Discharge    Functional Status Assessment Patient has not had a recent decline in their functional status  Frequency and Duration            Prognosis Prognosis for improved oropharyngeal function: Good      Swallow Study   General Date of Onset: 05/23/22 HPI: 89 year old female with a history of nephrectomy, CKD not on IHD, volume overload on Lasix, CAD status post PCI, and hypertension who presents to the emergency department with shortness of breath.  History somewhat limited due to patient.  States that she feels well and is not having any pain or shortness of breath at this time.  History obtained per EMS who states that patient's family called 86 because she seemed very short of breath while walking to the restroom.  Reports that her Lasix was recently decreased due to declining renal function that her amlodipine was recently increased. Chest xray shows: Hyperexpansion with minimal atelectasis or infiltrate at the left  base and tiny bilateral pleural effusions. Family reports that Pt has recently had difficulty swallowing pills, water, and food. BSE requested. Type of Study: Bedside Swallow Evaluation Previous Swallow Assessment: N/A Diet Prior to this Study: Regular;Thin liquids (Level 0) Temperature Spikes Noted: No Respiratory Status: Nasal cannula History of Recent Intubation: No Behavior/Cognition: Alert;Cooperative;Pleasant mood Oral Cavity Assessment: Within Functional Limits Oral Care Completed by SLP: Yes Oral Cavity - Dentition: Adequate natural dentition;Missing dentition Vision: Functional for self-feeding Self-Feeding Abilities: Needs assist (Pt with hand tremor) Patient Positioning: Upright in bed Baseline Vocal Quality: Normal Volitional Cough: Strong Volitional Swallow: Able to elicit    Oral/Motor/Sensory Function Overall Oral Motor/Sensory Function: Within functional limits   Ice Chips Ice chips: Within functional  limits Presentation: Spoon   Thin Liquid Thin Liquid: Within functional limits Presentation: Cup;Self Fed;Straw    Nectar Thick Nectar Thick Liquid: Not tested   Honey Thick Honey Thick Liquid: Not tested   Puree Puree: Within functional limits Presentation: Spoon   Solid     Solid: Within functional limits Presentation: Self Fed     Thank you,  Genene Churn, [place]  Sita Mangen 05/23/2022,4:18 PM

## 2022-05-23 NOTE — ED Triage Notes (Signed)
Patient got short of breath walking to bathroom this morning.  Initial sats 80%RA placed on 4L [place] and increased to 99%.  Kidney MD decreased lasix due to increase creatinine.

## 2022-05-23 NOTE — ED Provider Notes (Signed)
[facility] Provider Note   CSN: [id number] Arrival date & time: 05/23/22  4431     History  Chief Complaint  Patient presents with   Shortness of Breath    Carly [name] is a 89 y.o. female.  89 year old female with a history of nephrectomy, CKD not on IHD, volume overload on Lasix, CAD status post PCI, and hypertension who presents to the emergency department with shortness of breath.  History somewhat limited due to patient.  States that she feels well and is not having any pain or shortness of breath at this time.  History obtained per EMS who states that patient's family called 34 because she seemed very short of breath while walking to the restroom.  Reports that her Lasix was recently decreased due to declining renal function that her amlodipine was recently increased.  Additional history obtained per the patient's daughter Windell Hummingbird.  Reports that her mother has had persistent cough and shortness of breath over the past month.  Her Lasix was discontinued in this timeframe due to worsening renal function.  Her blood pressure started to become severely elevated so she was placed on amlodipine twice daily but reports that the shortness of breath had been getting progressively worse.  This morning she woke from a nap and stated that she was having difficulty breathing so she called 911.  Takes metoprolol for her blood pressure but denies any other blood pressure medications.  Says that her mother has not had any fevers, sore throat or runny nose.  Has not been complaining of any chest pain.       Home Medications Prior to Admission medications   Medication Sig Start Date End Date Taking? Authorizing Provider  acetaminophen (TYLENOL) 500 MG tablet Take 500 mg by mouth every 6 (six) hours as needed for mild pain.   Yes [provider]  amLODipine (NORVASC) 5 MG tablet TAKE (1) TABLET BY MOUTH ONCE DAILY. Patient taking  differently: Take 10 mg by mouth daily. 12/13/21  Yes [name], MD  aspirin 81 MG tablet Take 81 mg by mouth daily.   Yes [provider]  calcium carbonate (TUMS - DOSED IN MG ELEMENTAL CALCIUM) 500 MG chewable tablet Chew 1 tablet by mouth daily. 1200   Yes [provider]  metoprolol succinate (TOPROL-XL) 50 MG 24 hr tablet Take 1 tablet (50 mg total) by mouth in the morning and at bedtime. Please call to schedule an overdue appointment with Dr. Harrington Challenger for refills, [phone], thank you. 1st attempt. 12/13/21  Yes [name], MD  nitroGLYCERIN (NITROSTAT) 0.4 MG SL tablet Place 1 tablet (0.4 mg total) under the tongue every 5 (five) minutes as needed for chest pain. 02/09/20  Yes [name], MD  raloxifene (EVISTA) 60 MG tablet Take 60 mg by mouth daily.   Yes [provider]  simvastatin (ZOCOR) 10 MG tablet Take 1 tablet (10 mg total) by mouth daily at 6 PM. 12/13/21  Yes [name], MD  calcitRIOL (ROCALTROL) 0.25 MCG capsule Take 0.25 mcg by mouth 3 (three) times a week. 11/17/21   [provider]  cloNIDine (CATAPRES) 0.1 MG tablet Take by mouth daily. Take 1/2 tab as needed Patient not taking: Reported on 02/14/2022    [provider]  furosemide (LASIX) 20 MG tablet Take 40 mg by mouth daily. 07/22/20 02/14/22  [provider]  hydrALAZINE (APRESOLINE) 10 MG tablet Take 1 tablet (10 mg total)  by mouth in the morning and at bedtime. Patient not taking: Reported on 02/14/2022 12/13/21   [name], MD      Allergies    Patient has no known allergies.    Review of Systems   Review of Systems  Physical Exam Updated Vital Signs BP (!) 131/59 (BP Location: Right Arm)   Pulse 97   Temp 97.8 F (36.6 C) (Oral)   Resp 20   Ht 5' 1.5" (1.562 m)   Wt 64.9 kg   SpO2 90%   BMI 26.60 kg/m  Physical Exam Vitals and nursing note reviewed.  Constitutional:      General: She is not in acute distress.    Appearance: She is  well-developed.     Comments: Alert and conversant  HENT:     Head: Normocephalic and atraumatic.     Right Ear: External ear normal.     Left Ear: External ear normal.     Nose: Nose normal.  Eyes:     Extraocular Movements: Extraocular movements intact.     Conjunctiva/sclera: Conjunctivae normal.     Pupils: Pupils are equal, round, and reactive to light.  Cardiovascular:     Rate and Rhythm: Normal rate and regular rhythm.     Heart sounds: No murmur heard. Pulmonary:     Effort: Pulmonary effort is normal. No respiratory distress.     Breath sounds: Rales (Bibasilar) present.     Comments: On 2 L nasal cannula Abdominal:     General: Abdomen is flat. There is no distension.     Palpations: Abdomen is soft. There is no mass.     Tenderness: There is no abdominal tenderness. There is no guarding.  Musculoskeletal:     Cervical back: Normal range of motion and neck supple.     Right lower leg: Edema (1+) present.     Left lower leg: Edema (1+) present.  Skin:    General: Skin is warm and dry.  Neurological:     Mental Status: She is alert and oriented to person, place, and time. Mental status is at baseline.  Psychiatric:        Mood and Affect: Mood normal.     ED Results / Procedures / Treatments   Labs (all labs ordered are listed, but only abnormal results are displayed) Labs Reviewed  COMPREHENSIVE METABOLIC PANEL - Abnormal; Notable for the following components:      Result Value   CO2 20 (*)    Glucose, Bld 123 (*)    BUN 96 (*)    Creatinine, Ser 3.73 (*)    Calcium 8.5 (*)    GFR, Estimated 10 (*)    All other components within normal limits  CBC - Abnormal; Notable for the following components:   RBC 3.30 (*)    Hemoglobin 10.2 (*)    HCT 32.4 (*)    All other components within normal limits  BRAIN NATRIURETIC PEPTIDE - Abnormal; Notable for the following components:   B Natriuretic Peptide 877.0 (*)    All other components within normal limits   COMPREHENSIVE METABOLIC PANEL - Abnormal; Notable for the following components:   CO2 20 (*)    Glucose, Bld 129 (*)    BUN 94 (*)    Creatinine, Ser 3.76 (*)    Calcium 8.0 (*)    Total Protein 5.4 (*)    Albumin 2.8 (*)    Alkaline Phosphatase 35 (*)    GFR, Estimated 10 (*)  All other components within normal limits  CBC - Abnormal; Notable for the following components:   WBC 14.5 (*)    RBC 2.79 (*)    Hemoglobin 8.7 (*)    HCT 27.2 (*)    All other components within normal limits  RESP PANEL BY RT-PCR (RSV, FLU A&B, COVID)  [id number]  TSH  TROPONIN I (HIGH SENSITIVITY)  TROPONIN I (HIGH SENSITIVITY)    EKG EKG Interpretation  Date/Time:  Tuesday May 23 2022 10:00:50 EST Ventricular Rate:  79 PR Interval:  199 QRS Duration: 86 QT Interval:  390 QTC Calculation: 448 R Axis:   -23 Text Interpretation: Sinus rhythm Borderline left axis deviation Abnormal R-wave progression, late transition Confirmed by Margaretmary Eddy [id number]) on 05/23/2022 10:01:39 AM  Radiology DG Chest Port 1 View  Result Date: 05/23/2022 CLINICAL DATA:  Shortness of breath. EXAM: PORTABLE CHEST 1 VIEW COMPARISON:  12/08/2017 FINDINGS: Lungs are hyperexpanded. There is some minimal atelectasis or infiltrate at the left base with tiny bilateral pleural effusions. Interstitial markings are diffusely coarsened with chronic features. Cardiopericardial silhouette is at upper limits of normal for size. Telemetry leads overlie the chest. IMPRESSION: Hyperexpansion with minimal atelectasis or infiltrate at the left base and tiny bilateral pleural effusions. Electronically Signed   By: Misty Stanley M.D.   On: 05/23/2022 10:20    Procedures Procedures   Medications Ordered in ED Medications  hydrALAZINE (APRESOLINE) injection 10 mg (has no administration in time range)  aspirin EC tablet 81 mg (81 mg Oral Given 05/23/22 1244)  calcium carbonate (TUMS - dosed in mg elemental calcium) chewable tablet 200  mg of elemental calcium (200 mg of elemental calcium Oral Given 05/23/22 1244)  sodium chloride flush (NS) 0.9 % injection 3 mL (0 mLs Intravenous Duplicate 08/10/52 6568)  sodium chloride flush (NS) 0.9 % injection 3 mL (3 mLs Intravenous Given 05/23/22 2045)  sodium chloride flush (NS) 0.9 % injection 3 mL (has no administration in time range)  0.9 %  sodium chloride infusion (has no administration in time range)  acetaminophen (TYLENOL) tablet 650 mg (650 mg Oral Given 05/23/22 2353)    Or  acetaminophen (TYLENOL) suppository 650 mg ( Rectal See Alternative 05/23/22 2353)  traZODone (DESYREL) tablet 50 mg (50 mg Oral Given 05/23/22 2358)  polyethylene glycol (MIRALAX / GLYCOLAX) packet 17 g (has no administration in time range)  bisacodyl (DULCOLAX) suppository 10 mg (has no administration in time range)  ondansetron (ZOFRAN) tablet 4 mg ( Oral See Alternative 05/23/22 2352)    Or  ondansetron (ZOFRAN) injection 4 mg (4 mg Intravenous Given 05/23/22 2352)  heparin injection 5,000 Units (5,000 Units Subcutaneous Patient Refused/Not Given 05/24/22 0521)  albuterol (PROVENTIL) (2.5 MG/3ML) 0.083% nebulizer solution 2.5 mg (has no administration in time range)  furosemide (LASIX) injection 60 mg (60 mg Intravenous Given 05/24/22 0521)  metoprolol succinate (TOPROL-XL) 24 hr tablet 50 mg (50 mg Oral Given 05/23/22 2045)  sodium bicarbonate tablet 650 mg (650 mg Oral Given 05/23/22 2044)  hydrALAZINE (APRESOLINE) tablet 25 mg (25 mg Oral Given 05/23/22 1020)  furosemide (LASIX) injection 60 mg (60 mg Intravenous Given 05/23/22 1203)    ED Course/ Medical Decision Making/ A&P Clinical Course as of 05/24/22 1275  Tue May 23, 2022  1012 Attempted multiple times to call patient's family but they are unavailable. [RP]    Clinical Course User Index [RP] Fransico Meadow, MD  Medical Decision Making Amount and/or Complexity of Data Reviewed Labs: ordered. Radiology:  ordered.  Risk Prescription drug management. Decision regarding hospitalization.   ARLETHIA BASSO is a 89 y.o. female with comorbidities that complicate the patient evaluation including ephrectomy, CKD not on IHD, volume overload on Lasix, CAD status post PCI, and hypertension who presents to the emergency department with shortness of breath and lower extremity edema in the setting of stopping her Lasix.  Initial Ddx:  CHF, volume overload from CKD, MI, URI, PE, COPD exacerbation  MDM:   Patient likely has volume overload state from either CHF or CKD.  Does have a history of ischemic heart disease so we will obtain troponins and EKG to assess for MI as a possible precipitating factor but feel this is less likely.  Will also obtain viral testing for COVID and flu and.  Considered PE but feel this is less likely given the above history and appears to correspond with when she stopped taking her Lasix.  No history of smoking or COPD or wheezing that would suggest a COPD exacerbation.  Plan:  Labs Troponin BMP COVID and flu Troponin EKG Chest x-ray  ED Summary/Re-evaluation:  Patient reassessed and was stable on 3 L nasal cannula.  Labs returned which showed that she did have an AKI and elevated BNP.  Chest x-ray with pleural effusions though they are small.  EKG and troponins not suggestive of MI.  Given her persistent hypoxia will admit to medicine for further management.  This patient presents to the ED for concern of complaints listed in HPI, this involves an extensive number of treatment options, and is a complaint that carries with it a high risk of complications and morbidity. Disposition including potential need for admission considered.   Dispo: Admit to Floor  Additional history obtained from daughter Records reviewed Outpatient Clinic Notes The following labs were independently interpreted: Chemistry and show AKI and CKD I independently reviewed the following imaging with  scope of interpretation limited to determining acute life threatening conditions related to emergency care: Chest x-ray and agree with the radiologist interpretation with the following exceptions: none I personally reviewed and interpreted cardiac monitoring: normal sinus rhythm  I personally reviewed and interpreted the pt's EKG: see above for interpretation  I have reviewed the patients home medications and made adjustments as needed Consults: Hospitalist Social Determinants of health:  Elderly  Final Clinical Impression(s) / ED Diagnoses Final diagnoses:  Dyspnea, unspecified type  Hypoxia  AKI (acute kidney injury) ([place])  Chronic kidney disease, unspecified CKD stage    Rx / DC Orders ED Discharge Orders     None         Fransico Meadow, MD 05/24/22 [phone]

## 2022-05-23 NOTE — ED Notes (Signed)
ED TO INPATIENT HANDOFF REPORT  ED Nurse Name and Phone #: [phone]  S Name/Age/Gender Carly [name] 89 y.o. female Room/Bed: APA01/APA01  Code Status   Code Status: DNR  Home/SNF/Other Home Patient oriented to: self, place, time, and situation Is this baseline? Yes   Triage Complete: Triage complete  Chief Complaint Acute respiratory failure with hypoxia ([place]) [J96.01]  Triage Note Patient got short of breath walking to bathroom this morning.  Initial sats 80%RA placed on 4L [place] and increased to 99%.  Kidney MD decreased lasix due to increase creatinine.     Allergies No Known Allergies  Level of Care/Admitting Diagnosis ED Disposition     ED Disposition  Admit   Condition  --   Comment  Hospital Area: [facility] [[id number]]  Level of Care: Telemetry [5]  Covid Evaluation: Asymptomatic - no recent exposure (last 10 days) testing not required  Diagnosis: Acute respiratory failure with hypoxia [facility]) [[id number]]  Admitting Physician: Morrison Old  Attending Physician: Morrison Old          B Medical/Surgery History Past Medical History:  Diagnosis Date   CAD (coronary artery disease)    a. s/p PTCA/stent to LAD and PTCA of ramus in 2003. b. normal nuc 10/2012 with EF 87%   Carotid arterial disease ([place])    a. 07/2014 - duplex 00-76% RICA, 2-26% LICA.   CKD (chronic kidney disease), stage III (HCC)    Cystocele    Dyslipidemia    Gout    HTN (hypertension)    Transitional cell carcinoma ([place])    a. 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   Vaginal wall prolapse    Past Surgical History:  Procedure Laterality Date   APPENDECTOMY     CORONARY ANGIOPLASTY WITH STENT PLACEMENT     NEPHRECTOMY     right     A IV Location/Drains/Wounds Patient Lines/Drains/Airways Status     Active Line/Drains/Airways     Name Placement date Placement time Site Days   Peripheral IV  05/23/22 20 G Left Antecubital 05/23/22  1001  Antecubital  less than 1   External Urinary Catheter 05/23/22  1204  --  less than 1            Intake/Output Last 24 hours No intake or output data in the 24 hours ending 05/23/22 1204  Labs/Imaging Results for orders placed or performed during the hospital encounter of 05/23/22 (from the past 48 hour(s))  Resp panel by RT-PCR (RSV, Flu A&B, Covid) Anterior Nasal Swab     Status: None   Collection Time: 05/23/22 10:04 AM   Specimen: Anterior Nasal Swab  Result Value Ref Range   SARS Coronavirus 2 by RT PCR NEGATIVE NEGATIVE    Comment: (NOTE) SARS-CoV-2 target nucleic acids are NOT DETECTED.  The SARS-CoV-2 RNA is generally detectable in upper respiratory specimens during the acute phase of infection. The lowest concentration of SARS-CoV-2 viral copies this assay can detect is 138 copies/mL. A negative result does not preclude SARS-Cov-2 infection and should not be used as the sole basis for treatment or other patient management decisions. A negative result may occur with  improper specimen collection/handling, submission of specimen other than nasopharyngeal swab, presence of viral mutation(s) within the areas targeted by this assay, and inadequate number of viral copies(<138 copies/mL). A negative result must be combined with clinical observations, patient history, and epidemiological information. The expected result is Negative.  Fact Sheet  for Patients:  EntrepreneurPulse.com.au  Fact Sheet for Healthcare Providers:  IncredibleEmployment.be  This test is no t yet approved or cleared by the [place] FDA and  has been authorized for detection and/or diagnosis of SARS-CoV-2 by FDA under an Emergency Use Authorization (EUA). This EUA will remain  in effect (meaning this test can be used) for the duration of the COVID-19 declaration under Section 564(b)(1) of the Act, 21 U.S.C.section  360bbb-3(b)(1), unless the authorization is terminated  or revoked sooner.       Influenza A by PCR NEGATIVE NEGATIVE   Influenza B by PCR NEGATIVE NEGATIVE    Comment: (NOTE) The Xpert Xpress SARS-CoV-2/FLU/RSV plus assay is intended as an aid in the diagnosis of influenza from Nasopharyngeal swab specimens and should not be used as a sole basis for treatment. Nasal washings and aspirates are unacceptable for Xpert Xpress SARS-CoV-2/FLU/RSV testing.  Fact Sheet for Patients: EntrepreneurPulse.com.au  Fact Sheet for Healthcare Providers: IncredibleEmployment.be  This test is not yet approved or cleared by the [place] FDA and has been authorized for detection and/or diagnosis of SARS-CoV-2 by FDA under an Emergency Use Authorization (EUA). This EUA will remain in effect (meaning this test can be used) for the duration of the COVID-19 declaration under Section 564(b)(1) of the Act, 21 U.S.C. section 360bbb-3(b)(1), unless the authorization is terminated or revoked.     Resp Syncytial Virus by PCR NEGATIVE NEGATIVE    Comment: (NOTE) Fact Sheet for Patients: EntrepreneurPulse.com.au  Fact Sheet for Healthcare Providers: IncredibleEmployment.be  This test is not yet approved or cleared by the [place] FDA and has been authorized for detection and/or diagnosis of SARS-CoV-2 by FDA under an Emergency Use Authorization (EUA). This EUA will remain in effect (meaning this test can be used) for the duration of the COVID-19 declaration under Section 564(b)(1) of the Act, 21 U.S.C. section 360bbb-3(b)(1), unless the authorization is terminated or revoked.  Performed at [facility], [address], [place], [place] [address]   Comprehensive metabolic panel     Status: Abnormal   Collection Time: 05/23/22 10:04 AM  Result Value Ref Range   Sodium 140 135 - 145 mmol/L   Potassium 4.6 3.5 - 5.1 mmol/L    Chloride 108 98 - 111 mmol/L   CO2 20 (L) 22 - 32 mmol/L   Glucose, Bld 123 (H) 70 - 99 mg/dL    Comment: Glucose reference range applies only to samples taken after fasting for at least 8 hours.   BUN 96 (H) 8 - 23 mg/dL   Creatinine, Ser 3.73 (H) 0.44 - 1.00 mg/dL   Calcium 8.5 (L) 8.9 - 10.3 mg/dL   Total Protein 6.7 6.5 - 8.1 g/dL   Albumin 3.6 3.5 - 5.0 g/dL   AST 20 15 - 41 U/L   ALT 15 0 - 44 U/L   Alkaline Phosphatase 47 38 - 126 U/L   Total Bilirubin 0.6 0.3 - 1.2 mg/dL   GFR, Estimated 10 (L) >60 mL/min    Comment: (NOTE) Calculated using the CKD-EPI Creatinine Equation (2021)    Anion gap 12 5 - 15    Comment: Performed at [facility], [address], [place], [place] [address]  CBC     Status: Abnormal   Collection Time: 05/23/22 10:04 AM  Result Value Ref Range   WBC 10.2 4.0 - 10.5 K/uL   RBC 3.30 (L) 3.87 - 5.11 MIL/uL   Hemoglobin 10.2 (L) 12.0 - 15.0 g/dL   HCT 32.4 (  L) 36.0 - 46.0 %   MCV 98.2 80.0 - 100.0 fL   MCH 30.9 26.0 - 34.0 pg   MCHC 31.5 30.0 - 36.0 g/dL   RDW 15.1 11.5 - 15.5 %   Platelets 206 150 - 400 K/uL   nRBC 0.0 0.0 - 0.2 %    Comment: Performed at [facility], [address], [place], [place] [address]  Troponin I (High Sensitivity)     Status: None   Collection Time: 05/23/22 10:04 AM  Result Value Ref Range   Troponin I (High Sensitivity) 10 <18 ng/L    Comment: (NOTE) Elevated high sensitivity troponin I (hsTnI) values and significant  changes across serial measurements may suggest ACS but many other  chronic and acute conditions are known to elevate hsTnI results.  Refer to the "Links" section for chest pain algorithms and additional  guidance. Performed at [facility], [address] [place], [place] [address]    DG Chest Port 1 View  Result Date: 05/23/2022 CLINICAL DATA:  Shortness of breath. EXAM: PORTABLE CHEST 1 VIEW COMPARISON:  12/08/2017 FINDINGS: Lungs are hyperexpanded. There is some minimal atelectasis or  infiltrate at the left base with tiny bilateral pleural effusions. Interstitial markings are diffusely coarsened with chronic features. Cardiopericardial silhouette is at upper limits of normal for size. Telemetry leads overlie the chest. IMPRESSION: Hyperexpansion with minimal atelectasis or infiltrate at the left base and tiny bilateral pleural effusions. Electronically Signed   By: Misty Stanley M.D.   On: 05/23/2022 10:20    Pending Labs Unresulted Labs (From admission, onward)     Start     Ordered   05/24/22 0500  Comprehensive metabolic panel  Tomorrow morning,   R        05/23/22 1154   05/24/22 0500  CBC  Tomorrow morning,   R        05/23/22 1154   05/23/22 1002  Brain natriuretic peptide  Once,   URGENT        05/23/22 1002            Vitals/Pain Today's Vitals   05/23/22 1000 05/23/22 1100 05/23/22 1145 05/23/22 1200  BP: (!) 196/63 (!) 182/55 (!) 163/47 (!) 163/62  Pulse: 81 70 64 67  Resp: 13 18 16 20   Temp:      TempSrc:      SpO2: 93% 94% 97% 95%  Weight:      Height:      PainSc:        Isolation Precautions No active isolations  Medications Medications  hydrALAZINE (APRESOLINE) injection 10 mg (has no administration in time range)  aspirin tablet 81 mg (has no administration in time range)  amLODipine (NORVASC) tablet 10 mg (has no administration in time range)  hydrALAZINE (APRESOLINE) tablet 50 mg (has no administration in time range)  metoprolol succinate (TOPROL-XL) 24 hr tablet 50 mg (has no administration in time range)  calcium carbonate (TUMS - dosed in mg elemental calcium) chewable tablet 200 mg of elemental calcium (has no administration in time range)  sodium chloride flush (NS) 0.9 % injection 3 mL (has no administration in time range)  sodium chloride flush (NS) 0.9 % injection 3 mL (has no administration in time range)  sodium chloride flush (NS) 0.9 % injection 3 mL (has no administration in time range)  0.9 %  sodium chloride infusion  (has no administration in time range)  acetaminophen (TYLENOL) tablet 650 mg (has no administration in time range)  Or  acetaminophen (TYLENOL) suppository 650 mg (has no administration in time range)  traZODone (DESYREL) tablet 50 mg (has no administration in time range)  polyethylene glycol (MIRALAX / GLYCOLAX) packet 17 g (has no administration in time range)  bisacodyl (DULCOLAX) suppository 10 mg (has no administration in time range)  ondansetron (ZOFRAN) tablet 4 mg (has no administration in time range)    Or  ondansetron (ZOFRAN) injection 4 mg (has no administration in time range)  heparin injection 5,000 Units (has no administration in time range)  albuterol (PROVENTIL) (2.5 MG/3ML) 0.083% nebulizer solution 2.5 mg (has no administration in time range)  ipratropium-albuterol (DUONEB) 0.5-2.5 (3) MG/3ML nebulizer solution 3 mL (has no administration in time range)  hydrALAZINE (APRESOLINE) tablet 25 mg (25 mg Oral Given 05/23/22 1020)  furosemide (LASIX) injection 60 mg (60 mg Intravenous Given 05/23/22 1203)    Mobility walks with device     Focused Assessments Pulmonary Assessment Handoff:  Lung sounds: Bilateral Breath Sounds: Rales L Breath Sounds: Rales R Breath Sounds: Rales O2 Device: Nasal Cannula O2 Flow Rate (L/min): 4 L/min    R Recommendations: See Admitting Provider Note  Report given to:   Additional Notes: requiring 4L [place] at this time but does not wear oxygen at home \

## 2022-05-23 NOTE — ED Notes (Signed)
RT notified of peak flow meter

## 2022-05-23 NOTE — ED Notes (Signed)
Placed on 2L [place] and oxygen increased to 88% Increased to 4L  oxygen increased to 94%

## 2022-05-23 NOTE — Progress Notes (Signed)
Family refuses heparin sq and patient refuses SCDs. TED hose are on

## 2022-05-23 NOTE — Plan of Care (Signed)
  Problem: Education: Goal: Knowledge of General Education information will improve Description Including pain rating scale, medication(s)/side effects and non-pharmacologic comfort measures Outcome: Progressing   Problem: Health Behavior/Discharge Planning: Goal: Ability to manage health-related needs will improve Outcome: Progressing   

## 2022-05-24 ENCOUNTER — Observation Stay (HOSPITAL_COMMUNITY): Payer: Medicare Other

## 2022-05-24 ENCOUNTER — Encounter (HOSPITAL_COMMUNITY): Payer: Self-pay | Admitting: Internal Medicine

## 2022-05-24 DIAGNOSIS — Z7189 Other specified counseling: Secondary | ICD-10-CM | POA: Diagnosis not present

## 2022-05-24 DIAGNOSIS — Z66 Do not resuscitate: Secondary | ICD-10-CM | POA: Diagnosis present

## 2022-05-24 DIAGNOSIS — Z1152 Encounter for screening for COVID-19: Secondary | ICD-10-CM | POA: Diagnosis not present

## 2022-05-24 DIAGNOSIS — I2510 Atherosclerotic heart disease of native coronary artery without angina pectoris: Secondary | ICD-10-CM | POA: Diagnosis present

## 2022-05-24 DIAGNOSIS — N185 Chronic kidney disease, stage 5: Secondary | ICD-10-CM

## 2022-05-24 DIAGNOSIS — Z833 Family history of diabetes mellitus: Secondary | ICD-10-CM | POA: Diagnosis not present

## 2022-05-24 DIAGNOSIS — I4891 Unspecified atrial fibrillation: Secondary | ICD-10-CM | POA: Diagnosis not present

## 2022-05-24 DIAGNOSIS — I509 Heart failure, unspecified: Secondary | ICD-10-CM

## 2022-05-24 DIAGNOSIS — R0609 Other forms of dyspnea: Secondary | ICD-10-CM

## 2022-05-24 DIAGNOSIS — R0602 Shortness of breath: Secondary | ICD-10-CM | POA: Diagnosis not present

## 2022-05-24 DIAGNOSIS — R0902 Hypoxemia: Secondary | ICD-10-CM | POA: Diagnosis present

## 2022-05-24 DIAGNOSIS — Z85528 Personal history of other malignant neoplasm of kidney: Secondary | ICD-10-CM | POA: Diagnosis not present

## 2022-05-24 DIAGNOSIS — M6281 Muscle weakness (generalized): Secondary | ICD-10-CM | POA: Diagnosis not present

## 2022-05-24 DIAGNOSIS — J9601 Acute respiratory failure with hypoxia: Secondary | ICD-10-CM | POA: Diagnosis not present

## 2022-05-24 DIAGNOSIS — I10 Essential (primary) hypertension: Secondary | ICD-10-CM | POA: Diagnosis not present

## 2022-05-24 DIAGNOSIS — I120 Hypertensive chronic kidney disease with stage 5 chronic kidney disease or end stage renal disease: Secondary | ICD-10-CM | POA: Diagnosis not present

## 2022-05-24 DIAGNOSIS — I132 Hypertensive heart and chronic kidney disease with heart failure and with stage 5 chronic kidney disease, or end stage renal disease: Secondary | ICD-10-CM | POA: Diagnosis present

## 2022-05-24 DIAGNOSIS — E8779 Other fluid overload: Secondary | ICD-10-CM | POA: Diagnosis not present

## 2022-05-24 DIAGNOSIS — I340 Nonrheumatic mitral (valve) insufficiency: Secondary | ICD-10-CM | POA: Diagnosis present

## 2022-05-24 DIAGNOSIS — I779 Disorder of arteries and arterioles, unspecified: Secondary | ICD-10-CM | POA: Diagnosis not present

## 2022-05-24 DIAGNOSIS — E46 Unspecified protein-calorie malnutrition: Secondary | ICD-10-CM | POA: Diagnosis not present

## 2022-05-24 DIAGNOSIS — D509 Iron deficiency anemia, unspecified: Secondary | ICD-10-CM | POA: Diagnosis present

## 2022-05-24 DIAGNOSIS — M109 Gout, unspecified: Secondary | ICD-10-CM | POA: Diagnosis present

## 2022-05-24 DIAGNOSIS — Z515 Encounter for palliative care: Secondary | ICD-10-CM | POA: Diagnosis not present

## 2022-05-24 DIAGNOSIS — R600 Localized edema: Secondary | ICD-10-CM | POA: Diagnosis not present

## 2022-05-24 DIAGNOSIS — R1310 Dysphagia, unspecified: Secondary | ICD-10-CM | POA: Diagnosis not present

## 2022-05-24 DIAGNOSIS — Z955 Presence of coronary angioplasty implant and graft: Secondary | ICD-10-CM | POA: Diagnosis not present

## 2022-05-24 DIAGNOSIS — Z79899 Other long term (current) drug therapy: Secondary | ICD-10-CM | POA: Diagnosis not present

## 2022-05-24 DIAGNOSIS — N2581 Secondary hyperparathyroidism of renal origin: Secondary | ICD-10-CM | POA: Diagnosis present

## 2022-05-24 DIAGNOSIS — I519 Heart disease, unspecified: Secondary | ICD-10-CM | POA: Diagnosis not present

## 2022-05-24 DIAGNOSIS — R079 Chest pain, unspecified: Secondary | ICD-10-CM | POA: Diagnosis not present

## 2022-05-24 DIAGNOSIS — N179 Acute kidney failure, unspecified: Secondary | ICD-10-CM | POA: Diagnosis present

## 2022-05-24 DIAGNOSIS — D631 Anemia in chronic kidney disease: Secondary | ICD-10-CM | POA: Diagnosis present

## 2022-05-24 DIAGNOSIS — E872 Acidosis, unspecified: Secondary | ICD-10-CM | POA: Diagnosis not present

## 2022-05-24 DIAGNOSIS — J90 Pleural effusion, not elsewhere classified: Secondary | ICD-10-CM | POA: Diagnosis not present

## 2022-05-24 DIAGNOSIS — Z8249 Family history of ischemic heart disease and other diseases of the circulatory system: Secondary | ICD-10-CM | POA: Diagnosis not present

## 2022-05-24 DIAGNOSIS — M159 Polyosteoarthritis, unspecified: Secondary | ICD-10-CM | POA: Diagnosis not present

## 2022-05-24 DIAGNOSIS — N189 Chronic kidney disease, unspecified: Secondary | ICD-10-CM | POA: Diagnosis not present

## 2022-05-24 DIAGNOSIS — I5033 Acute on chronic diastolic (congestive) heart failure: Secondary | ICD-10-CM | POA: Diagnosis present

## 2022-05-24 DIAGNOSIS — E785 Hyperlipidemia, unspecified: Secondary | ICD-10-CM | POA: Diagnosis present

## 2022-05-24 DIAGNOSIS — Z905 Acquired absence of kidney: Secondary | ICD-10-CM | POA: Diagnosis not present

## 2022-05-24 DIAGNOSIS — Z7982 Long term (current) use of aspirin: Secondary | ICD-10-CM | POA: Diagnosis not present

## 2022-05-24 LAB — CBC
HCT: 27.2 % — ABNORMAL LOW (ref 36.0–46.0)
Hemoglobin: 8.7 g/dL — ABNORMAL LOW (ref 12.0–15.0)
MCH: 31.2 pg (ref 26.0–34.0)
MCHC: 32 g/dL (ref 30.0–36.0)
MCV: 97.5 fL (ref 80.0–100.0)
Platelets: 174 10*3/uL (ref 150–400)
RBC: 2.79 MIL/uL — ABNORMAL LOW (ref 3.87–5.11)
RDW: 15.2 % (ref 11.5–15.5)
WBC: 14.5 10*3/uL — ABNORMAL HIGH (ref 4.0–10.5)
nRBC: 0 % (ref 0.0–0.2)

## 2022-05-24 LAB — COMPREHENSIVE METABOLIC PANEL
ALT: 12 U/L (ref 0–44)
AST: 16 U/L (ref 15–41)
Albumin: 2.8 g/dL — ABNORMAL LOW (ref 3.5–5.0)
Alkaline Phosphatase: 35 U/L — ABNORMAL LOW (ref 38–126)
Anion gap: 11 (ref 5–15)
BUN: 94 mg/dL — ABNORMAL HIGH (ref 8–23)
CO2: 20 mmol/L — ABNORMAL LOW (ref 22–32)
Calcium: 8 mg/dL — ABNORMAL LOW (ref 8.9–10.3)
Chloride: 110 mmol/L (ref 98–111)
Creatinine, Ser: 3.76 mg/dL — ABNORMAL HIGH (ref 0.44–1.00)
GFR, Estimated: 10 mL/min — ABNORMAL LOW (ref >60)
Glucose, Bld: 129 mg/dL — ABNORMAL HIGH (ref 70–99)
Potassium: 4.7 mmol/L (ref 3.5–5.1)
Sodium: 141 mmol/L (ref 135–145)
Total Bilirubin: 0.5 mg/dL (ref 0.3–1.2)
Total Protein: 5.4 g/dL — ABNORMAL LOW (ref 6.5–8.1)

## 2022-05-24 LAB — ECHOCARDIOGRAM COMPLETE
Area-P 1/2: 3.08 cm2
Height: 61.5 in
MV M vel: 5.28 m/s
MV Peak grad: 111.7 mmHg
P 1/2 time: 232 msec
S' Lateral: 2.5 cm
Weight: 2289.26 oz

## 2022-05-24 LAB — TSH: TSH: 5.667 u[IU]/mL — ABNORMAL HIGH (ref 0.350–4.500)

## 2022-05-24 MED ORDER — FUROSEMIDE 10 MG/ML IJ SOLN
60.0000 mg | Freq: Two times a day (BID) | INTRAMUSCULAR | Status: DC
  Administered 2022-05-24 – 2022-05-25 (×3): 60 mg via INTRAVENOUS
  Administered 2022-05-26: 40 mg via INTRAVENOUS
  Filled 2022-05-24 (×3): qty 6

## 2022-05-24 NOTE — Progress Notes (Signed)
  Echocardiogram 2D Echocardiogram has been performed.  Carly [name] 05/24/2022, 12:07 PM

## 2022-05-24 NOTE — Progress Notes (Signed)
PROGRESS NOTE    UNNAMED HINO  WIO:[id number] DOB: Jan 24, 1919 DOA: 05/23/2022 PCP: Celene Squibb, MD   Brief Narrative:    Carly [name]  is a 89 y.o. female past medical history relevant for CKD 5,  history of transitional cell carcinoma of the right kidney and she is status post right nephro ureterectomy in May 2027, -Baseline creatinine from Care Everywhere from Dr. Toya Smothers office is 3.2-3.4, -Creatinine up to 3.73 today,,-Bicarb is 20 , history of hypertension, chronic anemia of CKD ( hemoglobin currently above 10 which is close to recent baseline), h/o CAD (s/p PTCA/Stent to the LAD and PTCA to ramus in 2003)/H/o PAD with known carotid artery stenosis.  She presented to the ED with sudden onset shortness of breath and was admitted for acute hypoxemic respiratory failure in the setting of volume overload  Assessment & Plan:   Principal Problem:   Acute respiratory failure with hypoxia ([place]) Active Problems:   Hypertensive heart and kidney disease with HF and CKD stage V (HCC)   CKD (chronic kidney disease), stage V (HCC)   HTN (hypertension)   HYPERTENSION, BENIGN   CAD, NATIVE VESSEL   Carotid arterial disease (HCC)   Dysphagia  Assessment and Plan:   1) acute hypoxic respiratory failure-likely secondary to CHF exacerbation with noted volume overload as patient has been off of Lasix -Elevated BNP noted at 877 (baseline is not available) -Chest x-ray with tiny pleural effusions possible CHF findings -Chronic leg edema noted -IV Lasix as ordered -Daily weight and fluid input and output monitoring -Check venous Dopplers to rule out DVT- ---if venous Dopplers are positive patient will need a VQ scan..  Not a candidate for CTA chest given renal function -COVID, flu and RSV negative -No productive cough or leukocytosis, no fevers or chills Initial troponin is 10 repeat is 11, EKG sinus without acute changes -In the ED patient required up to 4 L of oxygen via nasal cannula    2) CKD 5--- patient has history of transitional cell carcinoma of the right kidney and she is status post right nephro ureterectomy in May 2027 -Baseline creatinine from Care Everywhere from Dr. Toya Smothers office is 3.2-3.4 -Creatinine up to 3.76 today -Bicarb is 20 -Continue sodium bicarb supplements -Watch for possible worsening renal function with aggressive IV diuresis -As per patient and family she was previously told she is not a candidate for hemodialysis -renally adjust medications, avoid nephrotoxic agents / dehydration  / hypotension -Appreciate nephrology evaluation.  Dr. Candiss Norse contacted today who will follow-up on 2/15 and recommended IV Lasix 60 mg twice daily for now.     3)Social/Ethics---Discussed with daughters Estill Batten and Windell Hummingbird and granddaughter Arts development officer -Patient is a DNR/DNI -Please see MOST form completed -Appreciate palliative consultation   4) chronic anemia of CKD--- hemoglobin currently above 10 which is close to recent baseline -No bleeding concerns -Hemoglobin is downtrending, recheck in a.m.   5)CAD (s/p PTCA/Stent to the LAD and PTCA to ramus in 2003)/H/o PAD with known carotid artery stenosis --- Currently chest pain-free. Initial troponin is 10 repeat is 11, EKG sinus without acute changes -Continue aspirin and metoprolol -Hold simvastatin    DVT prophylaxis:Heparin Code Status: DNR Family Communication: 2 daughters at bedside 2/14 Disposition Plan:  Status is: Inpatient Remains inpatient appropriate because: Need for IV medications.   Consultants:  Nephrology Palliative  Procedures:  None  Antimicrobials:  None   Subjective: Patient seen and evaluated today with no new acute complaints or concerns. No acute  concerns or events noted overnight.  She denies any significant shortness of breath this morning.  Objective: Vitals:   05/23/22 1746 05/23/22 2147 05/24/22 0457 05/24/22 0500  BP: (!) 156/58 (!) 121/56 (!) 131/59   Pulse:  74 85 97   Resp: 18 18 20    Temp: (!) 97.5 F (36.4 C) 97.8 F (36.6 C) 97.8 F (36.6 C)   TempSrc: Oral  Oral   SpO2: 96% 91% 90%   Weight:    64.9 kg  Height:        Intake/Output Summary (Last 24 hours) at 05/24/2022 0803 Last data filed at 05/23/2022 2230 Gross per 24 hour  Intake --  Output 300 ml  Net -300 ml   Filed Weights   05/23/22 0955 05/24/22 0500  Weight: 52.6 kg 64.9 kg    Examination:  General exam: Appears calm and comfortable  Respiratory system: Clear to auscultation. Respiratory effort normal.  2 L nasal cannula Cardiovascular system: S1 & S2 heard, RRR.  Gastrointestinal system: Abdomen is soft Central nervous system: Alert and awake Extremities: No edema Skin: No significant lesions noted Psychiatry: Flat affect.    Data Reviewed: I have personally reviewed following labs and imaging studies  CBC: Recent Labs  Lab 05/23/22 1004 05/24/22 0443  WBC 10.2 14.5*  HGB 10.2* 8.7*  HCT 32.4* 27.2*  MCV 98.2 97.5  PLT 206 106   Basic Metabolic Panel: Recent Labs  Lab 05/23/22 1004 05/24/22 0443  NA 140 141  K 4.6 4.7  CL 108 110  CO2 20* 20*  GLUCOSE 123* 129*  BUN 96* 94*  CREATININE 3.73* 3.76*  CALCIUM 8.5* 8.0*   GFR: Estimated Creatinine Clearance: 6.8 mL/min (A) (by C-G formula based on SCr of 3.76 mg/dL (H)). Liver Function Tests: Recent Labs  Lab 05/23/22 1004 05/24/22 0443  AST 20 16  ALT 15 12  ALKPHOS 47 35*  BILITOT 0.6 0.5  PROT 6.7 5.4*  ALBUMIN 3.6 2.8*   No results for input(s): "LIPASE", "AMYLASE" in the last 168 hours. No results for input(s): "AMMONIA" in the last 168 hours. Coagulation Profile: No results for input(s): "INR", "PROTIME" in the last 168 hours. Cardiac Enzymes: No results for input(s): "CKTOTAL", "CKMB", "CKMBINDEX", "TROPONINI" in the last 168 hours. BNP (last 3 results) No results for input(s): "PROBNP" in the last 8760 hours. HbA1C: No results for input(s): "HGBA1C" in the last 72  hours. CBG: No results for input(s): "GLUCAP" in the last 168 hours. Lipid Profile: No results for input(s): "CHOL", "HDL", "LDLCALC", "TRIG", "CHOLHDL", "LDLDIRECT" in the last 72 hours. Thyroid Function Tests: No results for input(s): "TSH", "T4TOTAL", "FREET4", "T3FREE", "THYROIDAB" in the last 72 hours. Anemia Panel: No results for input(s): "VITAMINB12", "FOLATE", "FERRITIN", "TIBC", "IRON", "RETICCTPCT" in the last 72 hours. Sepsis Labs: No results for input(s): "PROCALCITON", "LATICACIDVEN" in the last 168 hours.  Recent Results (from the past 240 hour(s))  Resp panel by RT-PCR (RSV, Flu A&B, Covid) Anterior Nasal Swab     Status: None   Collection Time: 05/23/22 10:04 AM   Specimen: Anterior Nasal Swab  Result Value Ref Range Status   SARS Coronavirus 2 by RT PCR NEGATIVE NEGATIVE Final    Comment: (NOTE) SARS-CoV-2 target nucleic acids are NOT DETECTED.  The SARS-CoV-2 RNA is generally detectable in upper respiratory specimens during the acute phase of infection. The lowest concentration of SARS-CoV-2 viral copies this assay can detect is 138 copies/mL. A negative result does not preclude SARS-Cov-2 infection and should not be  used as the sole basis for treatment or other patient management decisions. A negative result may occur with  improper specimen collection/handling, submission of specimen other than nasopharyngeal swab, presence of viral mutation(s) within the areas targeted by this assay, and inadequate number of viral copies(<138 copies/mL). A negative result must be combined with clinical observations, patient history, and epidemiological information. The expected result is Negative.  Fact Sheet for Patients:  EntrepreneurPulse.com.au  Fact Sheet for Healthcare Providers:  IncredibleEmployment.be  This test is no t yet approved or cleared by the [place] FDA and  has been authorized for detection and/or diagnosis of  SARS-CoV-2 by FDA under an Emergency Use Authorization (EUA). This EUA will remain  in effect (meaning this test can be used) for the duration of the COVID-19 declaration under Section 564(b)(1) of the Act, 21 U.S.C.section 360bbb-3(b)(1), unless the authorization is terminated  or revoked sooner.       Influenza A by PCR NEGATIVE NEGATIVE Final   Influenza B by PCR NEGATIVE NEGATIVE Final    Comment: (NOTE) The Xpert Xpress SARS-CoV-2/FLU/RSV plus assay is intended as an aid in the diagnosis of influenza from Nasopharyngeal swab specimens and should not be used as a sole basis for treatment. Nasal washings and aspirates are unacceptable for Xpert Xpress SARS-CoV-2/FLU/RSV testing.  Fact Sheet for Patients: EntrepreneurPulse.com.au  Fact Sheet for Healthcare Providers: IncredibleEmployment.be  This test is not yet approved or cleared by the [place] FDA and has been authorized for detection and/or diagnosis of SARS-CoV-2 by FDA under an Emergency Use Authorization (EUA). This EUA will remain in effect (meaning this test can be used) for the duration of the COVID-19 declaration under Section 564(b)(1) of the Act, 21 U.S.C. section 360bbb-3(b)(1), unless the authorization is terminated or revoked.     Resp Syncytial Virus by PCR NEGATIVE NEGATIVE Final    Comment: (NOTE) Fact Sheet for Patients: EntrepreneurPulse.com.au  Fact Sheet for Healthcare Providers: IncredibleEmployment.be  This test is not yet approved or cleared by the [place] FDA and has been authorized for detection and/or diagnosis of SARS-CoV-2 by FDA under an Emergency Use Authorization (EUA). This EUA will remain in effect (meaning this test can be used) for the duration of the COVID-19 declaration under Section 564(b)(1) of the Act, 21 U.S.C. section 360bbb-3(b)(1), unless the authorization is terminated  or revoked.  Performed at [facility], [address], [place], [place] [address]          Radiology Studies: [facility] Chest [facility] 1 View  Result Date: 05/23/2022 CLINICAL DATA:  Shortness of breath. EXAM: PORTABLE CHEST 1 VIEW COMPARISON:  12/08/2017 FINDINGS: Lungs are hyperexpanded. There is some minimal atelectasis or infiltrate at the left base with tiny bilateral pleural effusions. Interstitial markings are diffusely coarsened with chronic features. Cardiopericardial silhouette is at upper limits of normal for size. Telemetry leads overlie the chest. IMPRESSION: Hyperexpansion with minimal atelectasis or infiltrate at the left base and tiny bilateral pleural effusions. Electronically Signed   By: Misty Stanley M.D.   On: 05/23/2022 10:20        Scheduled Meds:  aspirin EC  81 mg Oral Daily   calcium carbonate  1 tablet Oral Daily   furosemide  60 mg Intravenous Daily   heparin  5,000 Units Subcutaneous Q8H   metoprolol succinate  50 mg Oral BID   sodium bicarbonate  650 mg Oral BID   sodium chloride flush  3 mL Intravenous Q12H   sodium chloride flush  3 mL Intravenous  Q12H   Continuous Infusions:  sodium chloride       LOS: 0 days    Time spent: 35 minutes    Missy Baksh Darleen Crocker, DO [facility]  If 7PM-7AM, please contact night-coverage [email or web address] 05/24/2022, 8:03 AM

## 2022-05-24 NOTE — TOC Progression Note (Addendum)
  Transition of Care [facility]) Screening Note   Patient Details  Name: Carly [name] Date of Birth: 1918/11/16   Transition of Care [facility]) CM/SW Contact:    Shade Flood, LCSW Phone Number: 05/24/2022, 10:05 AM    TOC will follow for possible HH and/or home O2 needs at dc. Referral for outpatient palliative care sent to [place] at family request.  Transition of Care Department [facility]) has reviewed patient and no TOC needs have been identified at this time. We will continue to monitor patient advancement through interdisciplinary progression rounds. If new patient transition needs arise, please place a TOC consult.

## 2022-05-24 NOTE — Consult Note (Signed)
Consultation Note Date: 05/24/2022   Patient Name: Carly [name]  DOB: 29-Jan-1919  MRN: [id number]  Age / Sex: 89 y.o., female  PCP: Celene Squibb, MD Referring Physician: Rodena Goldmann, DO  Reason for Consultation: Establishing goals of care  HPI/Patient Profile: 89 y.o. female  with past medical history of coronary artery disease status post stent 2003, carotid artery disease, CKD declines HD, cystocele, HTN/HLD, transitional cell carcinoma of the right pelvic ureter status post right nephrectomy in 07 admitted on 05/23/2022 with acute respiratory failure likely secondary to heart failure exacerbation, CKD 5.   Clinical Assessment and Goals of Care: I have reviewed medical records including EPIC notes, labs and imaging, received report from RN, assessed the patient.  Carly [name] is sitting up in the bed.  She greets me, making and somewhat keeping eye contact.  She is alert and oriented x 3, able to make her needs known.  Her daughter, Windell Hummingbird, is present at bedside along with nursing student.  Nursing student assists Carly [name] into the [employer] chair..    We meet at the bedside to discuss diagnosis prognosis, GOC, EOL wishes, disposition and options. I introduced Palliative Medicine as specialized medical care for people living with serious illness. It focuses on providing relief from the symptoms and stress of a serious illness. The goal is to improve quality of life for both the patient and the family.  We discussed a brief life review of the patient.  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  She has 2 adult daughters, Windell Hummingbird and Elmo Putt.  She tells me that she has paid caregivers from 10 AM until 4 PM 6 days a week.  Her 2 daughters alternate spending the night with her.  She is independent with ADLs, uses a walker.  We then focused on their current illness.  We talk in detail about heart failure and the treatment plan.   We talked about the difficulty in managing heart failure as the heart needs to stay dry but the kidneys need to stay wet.  Overall, Carly [name] and her daughters have a trusting relationship with nephrologist, Dr. Theador Hawthorne.  The natural disease trajectory and expectations at EOL were discussed.  Advanced directives, concepts specific to code status, artifical feeding and hydration, and rehospitalization were considered and discussed.  DNR verified.  Hospice and Palliative Care services outpatient were explained and offered.  We talked about the benefits of outpatient palliative services for continued support, in particular chronic illness management.  We talked about the benefits of hospice care when appropriate.  Provider choice offered.  Provider of choice is [facility] [place].   Discussed the importance of continued conversation with family and the medical providers regarding overall plan of care and treatment options, ensuring decisions are within the context of the patient's values and GOCs.  Questions and concerns were addressed.  The family was encouraged to call with questions or concerns.  PMT will continue to support holistically.  Conference with attending, bedside nursing staff, transition of care team  related to patient condition, needs, goals of care, disposition.   HCPOA NEXT OF KIN -daughters Windell Hummingbird and Elmo Putt.    SUMMARY OF RECOMMENDATIONS   Continue to treat the treatable but no CPR or intubation.   Time for outcomes. Requesting outpatient palliative services for support.   Code Status/Advance Care Planning: DNR  Symptom Management:  Hospitalist, no additional needs at this time.  Palliative Prophylaxis:  Frequent Pain Assessment and Oral Care  Additional Recommendations (Limitations, Scope, Preferences): Treat the treatable but no CPR or intubation.  Psycho-social/Spiritual:  Desire for further Chaplaincy support:no Additional Recommendations:   Treat the treatable but no CPR or intubation.  Outpatient palliative.  Prognosis:  Unable to determine, based on outcomes.  6 months or less would not be surprising based on advanced age.   Discharge Planning: Home with Home Health      Primary Diagnoses: Present on Admission:  Acute respiratory failure with hypoxia (HCC)  CAD, NATIVE VESSEL  Carotid arterial disease (HCC)  HYPERTENSION, BENIGN  Hypertensive heart and kidney disease with HF and CKD stage V (HCC)  CKD (chronic kidney disease), stage V (HCC)  HTN (hypertension)   I have reviewed the medical record, interviewed the patient and family, and examined the patient. The following aspects are pertinent.  Past Medical History:  Diagnosis Date   CAD (coronary artery disease)    a. s/p PTCA/stent to LAD and PTCA of ramus in 2003. b. normal nuc 10/2012 with EF 87%   Carotid arterial disease ([place])    a. 07/2014 - duplex 66-44% RICA, 0-34% LICA.   CKD (chronic kidney disease), stage III (HCC)    Cystocele    Dyslipidemia    Gout    HTN (hypertension)    Transitional cell carcinoma ([place])    a. 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   Vaginal wall prolapse    Social History   Socioeconomic History   Marital status: Widowed    Spouse name: Not on file   Number of children: 2   Years of education: Not on file   Highest education level: Not on file  Occupational History   Not on file  Tobacco Use   Smoking status: Never   Smokeless tobacco: Never  Vaping Use   Vaping Use: Never used  Substance and Sexual Activity   Alcohol use: No   Drug use: No   Sexual activity: Never  Other Topics Concern   Not on file  Social History Narrative   Not on file   Social Determinants of Health   Financial Resource Strain: Not on file  Food Insecurity: No Food Insecurity (05/23/2022)   Hunger Vital Sign    Worried About Running Out of Food in the Last Year: Never true     Ran Out of Food in the Last Year: Never true  Transportation Needs: No Transportation Needs (05/23/2022)   PRAPARE - Hydrologist (Medical): No    Lack of Transportation (Non-Medical): No  Physical Activity: Not on file  Stress: Not on file  Social Connections: Not on file   Family History  Problem Relation Age of Onset   Heart attack Father    Diabetes Father    Cancer Unknown    Heart disease Unknown    Scheduled Meds:  aspirin EC  81 mg Oral Daily   calcium carbonate  1 tablet Oral Daily   furosemide  60 mg Intravenous BID   heparin  5,000 Units Subcutaneous Q8H   metoprolol succinate  50 mg Oral BID   sodium bicarbonate  650 mg Oral BID   sodium chloride flush  3 mL Intravenous Q12H   sodium chloride flush  3 mL Intravenous Q12H   Continuous Infusions:  sodium chloride     PRN Meds:.sodium chloride, acetaminophen **OR** acetaminophen, albuterol, bisacodyl, hydrALAZINE, ondansetron **OR** ondansetron (ZOFRAN) IV, polyethylene glycol, sodium chloride flush, traZODone Medications Prior to Admission:  Prior to Admission medications   Medication Sig Start Date End Date Taking? Authorizing Provider  acetaminophen (TYLENOL) 500 MG tablet Take 500 mg by mouth every 6 (six) hours as needed for mild pain.   Yes [provider]  amLODipine (NORVASC) 5 MG tablet TAKE (1) TABLET BY MOUTH ONCE DAILY. Patient taking differently: Take 10 mg by mouth daily. 12/13/21  Yes [name], MD  aspirin 81 MG tablet Take 81 mg by mouth daily.   Yes [provider]  calcium carbonate (TUMS - DOSED IN MG ELEMENTAL CALCIUM) 500 MG chewable tablet Chew 1 tablet by mouth daily. 1200   Yes [provider]  metoprolol succinate (TOPROL-XL) 50 MG 24 hr tablet Take 1 tablet (50 mg total) by mouth in the morning and at bedtime. Please call to schedule an overdue appointment with Dr. Harrington Challenger for refills, [phone], thank you. 1st attempt. 12/13/21  Yes [name], MD  nitroGLYCERIN (NITROSTAT) 0.4 MG SL tablet Place 1 tablet (0.4 mg total) under the tongue every 5 (five) minutes as needed for chest pain. 02/09/20  Yes [name], MD  raloxifene (EVISTA) 60 MG tablet Take 60 mg by mouth daily.   Yes [provider]  simvastatin (ZOCOR) 10 MG tablet Take 1 tablet (10 mg total) by mouth daily at 6 PM. 12/13/21  Yes [name], MD  calcitRIOL (ROCALTROL) 0.25 MCG capsule Take 0.25 mcg by mouth 3 (three) times a week. 11/17/21   [provider]  cloNIDine (CATAPRES) 0.1 MG tablet Take by mouth daily. Take 1/2 tab as needed Patient not taking: Reported on 02/14/2022    [provider]  furosemide (LASIX) 20 MG tablet Take 40 mg by mouth daily. 07/22/20 02/14/22  [provider]  hydrALAZINE (APRESOLINE) 10 MG tablet Take 1 tablet (10 mg total) by mouth in the morning and at bedtime. Patient not taking: Reported on 02/14/2022 12/13/21   [name], MD   No Known Allergies Review of Systems  Unable to perform ROS: Age    Physical Exam Vitals reviewed.  Constitutional:      General: She is not in acute distress.    Appearance: She is normal weight. She is ill-appearing.  Pulmonary:     Effort: Pulmonary effort is normal. No tachypnea.  Skin:    General: Skin is warm and dry.  Neurological:     Mental Status: She is alert and oriented to person, place, and time.  Psychiatric:        Mood and Affect: Mood normal.        Behavior: Behavior normal.     Vital Signs: BP (!) 152/51 (BP Location: Right Arm)   Pulse 74   Temp 97.6 F (36.4 C) (Oral)   Resp 20   Ht 5' 1.5" (1.562 m)   Wt 64.9 kg   SpO2 92%   BMI 26.60 kg/m  Pain Scale: 0-10   Pain Score: 0-No pain   SpO2: SpO2: 92 % O2 Device:SpO2: 92 % O2 Flow Rate: .  O2 Flow Rate (L/min): 2 L/min  IO: Intake/output summary:  Intake/Output Summary (Last 24 hours) at 05/24/2022 1342 Last data filed at 05/23/2022 2230 Gross per 24 hour  Intake --   Output 300 ml  Net -300 ml    LBM: Last BM Date : 05/22/22 Baseline Weight: Weight: 52.6 kg Most recent weight: Weight: 64.9 kg     Palliative Assessment/Data:     Time In: 1000 Time Out: 1115 Time Total:  75 minutes  Greater than 50%  of this time was spent counseling and coordinating care related to the above assessment and plan.  Signed by: Drue Novel, NP   Please contact Palliative Medicine Team phone at [phone] for questions and concerns.  For individual provider: See Shea Evans

## 2022-05-24 NOTE — Progress Notes (Signed)
Telemetry called to alert nurse that patient had sustaining A-fib rhythm. Nurse mad Dr. Josph Macho aware, verbal orders to obtained 12-Lead EKG. EKG results in chart, no new orders at this time.

## 2022-05-24 NOTE — Plan of Care (Signed)

## 2022-05-25 DIAGNOSIS — J9601 Acute respiratory failure with hypoxia: Secondary | ICD-10-CM | POA: Diagnosis not present

## 2022-05-25 LAB — BASIC METABOLIC PANEL
Anion gap: 8 (ref 5–15)
BUN: 93 mg/dL — ABNORMAL HIGH (ref 8–23)
CO2: 23 mmol/L (ref 22–32)
Calcium: 7.6 mg/dL — ABNORMAL LOW (ref 8.9–10.3)
Chloride: 107 mmol/L (ref 98–111)
Creatinine, Ser: 4 mg/dL — ABNORMAL HIGH (ref 0.44–1.00)
GFR, Estimated: 9 mL/min — ABNORMAL LOW (ref >60)
Glucose, Bld: 89 mg/dL (ref 70–99)
Potassium: 4.3 mmol/L (ref 3.5–5.1)
Sodium: 138 mmol/L (ref 135–145)

## 2022-05-25 LAB — IRON AND TIBC
Iron: 27 ug/dL — ABNORMAL LOW (ref 28–170)
Saturation Ratios: 9 % — ABNORMAL LOW (ref 10.4–31.8)
TIBC: 311 ug/dL (ref 250–450)
UIBC: 284 ug/dL

## 2022-05-25 LAB — URINALYSIS, ROUTINE W REFLEX MICROSCOPIC
Bilirubin Urine: NEGATIVE
Glucose, UA: NEGATIVE mg/dL
Hgb urine dipstick: NEGATIVE
Ketones, ur: NEGATIVE mg/dL
Leukocytes,Ua: NEGATIVE
Nitrite: NEGATIVE
Protein, ur: 100 mg/dL — AB
Specific Gravity, Urine: 1.01 (ref 1.005–1.030)
pH: 6 (ref 5.0–8.0)

## 2022-05-25 LAB — RETICULOCYTES
Immature Retic Fract: 22.2 % — ABNORMAL HIGH (ref 2.3–15.9)
RBC.: 2.78 MIL/uL — ABNORMAL LOW (ref 3.87–5.11)
Retic Count, Absolute: 80.3 10*3/uL (ref 19.0–186.0)
Retic Ct Pct: 2.9 % (ref 0.4–3.1)

## 2022-05-25 LAB — CBC
HCT: 24.5 % — ABNORMAL LOW (ref 36.0–46.0)
Hemoglobin: 7.8 g/dL — ABNORMAL LOW (ref 12.0–15.0)
MCH: 31.6 pg (ref 26.0–34.0)
MCHC: 31.8 g/dL (ref 30.0–36.0)
MCV: 99.2 fL (ref 80.0–100.0)
Platelets: 153 10*3/uL (ref 150–400)
RBC: 2.47 MIL/uL — ABNORMAL LOW (ref 3.87–5.11)
RDW: 15.3 % (ref 11.5–15.5)
WBC: 9.4 10*3/uL (ref 4.0–10.5)
nRBC: 0 % (ref 0.0–0.2)

## 2022-05-25 LAB — VITAMIN B12: Vitamin B-12: 325 pg/mL (ref 180–914)

## 2022-05-25 LAB — PHOSPHORUS: Phosphorus: 5.2 mg/dL — ABNORMAL HIGH (ref 2.5–4.6)

## 2022-05-25 LAB — FERRITIN: Ferritin: 28 ng/mL (ref 11–307)

## 2022-05-25 LAB — MAGNESIUM: Magnesium: 2.1 mg/dL (ref 1.7–2.4)

## 2022-05-25 LAB — FOLATE: Folate: 10.2 ng/mL (ref >5.9)

## 2022-05-25 NOTE — Progress Notes (Signed)
PROGRESS NOTE    Carly [name]  WLS:[id number] DOB: Dec 23, 1918 DOA: 05/23/2022 PCP: Celene Squibb, MD   Brief Narrative:    Carly [name]  is a 89 y.o. female past medical history relevant for CKD 5,  history of transitional cell carcinoma of the right kidney and she is status post right nephro ureterectomy in May 2027, -Baseline creatinine from Care Everywhere from Dr. Toya Smothers office is 3.2-3.4, -Creatinine up to 3.73 today,,-Bicarb is 20 , history of hypertension, chronic anemia of CKD ( hemoglobin currently above 10 which is close to recent baseline), h/o CAD (s/p PTCA/Stent to the LAD and PTCA to ramus in 2003)/H/o PAD with known carotid artery stenosis.  She presented to the ED with sudden onset shortness of breath and was admitted for acute hypoxemic respiratory failure in the setting of volume overload.Appreciate nephrology recommendations for continued IV diuresis.  Noted to have worsening anemia that will require further evaluation.  Assessment & Plan:   Principal Problem:   Acute respiratory failure with hypoxia (HCC) Active Problems:   Hypertensive heart and kidney disease with HF and CKD stage V (HCC)   CKD (chronic kidney disease), stage V (HCC)   HTN (hypertension)   HYPERTENSION, BENIGN   CAD, NATIVE VESSEL   Carotid arterial disease (HCC)   Dysphagia   Acute CHF (HCC)  Assessment and Plan:   1) acute hypoxic respiratory failure-likely secondary to CHF exacerbation with noted volume overload as patient has been off of Lasix -Elevated BNP noted at 877 (baseline is not available) -Chest x-ray with tiny pleural effusions possible CHF findings -Chronic leg edema noted -IV Lasix as ordered per nephrology recommendations -Daily weight and fluid input and output monitoring -Check venous Dopplers to rule out DVT- ---if venous Dopplers are positive patient will need a VQ scan..  Not a candidate for CTA chest given renal function -COVID, flu and RSV negative -No productive  cough or leukocytosis, no fevers or chills Initial troponin is 10 repeat is 11, EKG sinus without acute changes -In the ED patient required up to 4 L of oxygen via nasal cannula   2) CKD 5--- patient has history of transitional cell carcinoma of the right kidney and she is status post right nephro ureterectomy in May 2027 -Baseline creatinine from Care Everywhere from Dr. Toya Smothers office is 3.2-3.4 -Creatinine up to 4 today -Bicarb is 23 -Continue sodium bicarb supplements -Watch for possible worsening renal function with aggressive IV diuresis -As per patient and family she was previously told she is not a candidate for hemodialysis -renally adjust medications, avoid nephrotoxic agents / dehydration  / hypotension -Appreciate nephrology evaluation.  Dr. Candiss Norse contacted today who will follow-up on 2/15 and recommended IV Lasix 60 mg twice daily for now.     3)Social/Ethics---Discussed with daughters Estill Batten and Windell Hummingbird and granddaughter Arts development officer -Patient is a DNR/DNI -Please see MOST form completed -Appreciate palliative consultation   4) acute on chronic anemia of CKD--- hemoglobin currently above 10 which is close to recent baseline -No bleeding concerns -Hemoglobin is downtrending, recheck in a.m. -Check FOBT today as well as anemia panel and hold heparin agents as well as aspirin   5)CAD (s/p PTCA/Stent to the LAD and PTCA to ramus in 2003)/H/o PAD with known carotid artery stenosis --- Currently chest pain-free. Initial troponin is 10 repeat is 11, EKG sinus without acute changes -Continue metoprolol -Hold simvastatin    DVT prophylaxis:SCDs Code Status: DNR Family Communication: Daughter at bedside 2/15 Disposition Plan:  Status is:  Inpatient Remains inpatient appropriate because: Need for IV medications.   Consultants:  Nephrology Palliative  Procedures:  None  Antimicrobials:  None   Subjective: Patient seen and evaluated today with no new acute  complaints or concerns. No acute concerns or events noted overnight.  She denies any significant shortness of breath this morning.  Objective: Vitals:   05/24/22 1431 05/24/22 2119 05/25/22 0441 05/25/22 0500  BP: (!) 148/60 (!) 153/61 (!) 118/54   Pulse: 72 72 66   Resp: 20 16 18    Temp: 97.6 F (36.4 C) (!) 97.4 F (36.3 C) 98.2 F (36.8 C)   TempSrc: Oral Oral Oral   SpO2: 95% 92% 90%   Weight:    65 kg  Height:        Intake/Output Summary (Last 24 hours) at 05/25/2022 0854 Last data filed at 05/25/2022 0755 Gross per 24 hour  Intake --  Output 650 ml  Net -650 ml   Filed Weights   05/23/22 0955 05/24/22 0500 05/25/22 0500  Weight: 52.6 kg 64.9 kg 65 kg    Examination:  General exam: Appears calm and comfortable  Respiratory system: Clear to auscultation. Respiratory effort normal.  2 L nasal cannula Cardiovascular system: S1 & S2 heard, RRR.  Gastrointestinal system: Abdomen is soft Central nervous system: Alert and awake Extremities: No edema Skin: No significant lesions noted Psychiatry: Flat affect.    Data Reviewed: I have personally reviewed following labs and imaging studies  CBC: Recent Labs  Lab 05/23/22 1004 05/24/22 0443 05/25/22 0447  WBC 10.2 14.5* 9.4  HGB 10.2* 8.7* 7.8*  HCT 32.4* 27.2* 24.5*  MCV 98.2 97.5 99.2  PLT 206 174 858   Basic Metabolic Panel: Recent Labs  Lab 05/23/22 1004 05/24/22 0443 05/25/22 0447  NA 140 141 138  K 4.6 4.7 4.3  CL 108 110 107  CO2 20* 20* 23  GLUCOSE 123* 129* 89  BUN 96* 94* 93*  CREATININE 3.73* 3.76* 4.00*  CALCIUM 8.5* 8.0* 7.6*  MG  --   --  2.1   GFR: Estimated Creatinine Clearance: 6.4 mL/min (A) (by C-G formula based on SCr of 4 mg/dL (H)). Liver Function Tests: Recent Labs  Lab 05/23/22 1004 05/24/22 0443  AST 20 16  ALT 15 12  ALKPHOS 47 35*  BILITOT 0.6 0.5  PROT 6.7 5.4*  ALBUMIN 3.6 2.8*   No results for input(s): "LIPASE", "AMYLASE" in the last 168 hours. No results  for input(s): "AMMONIA" in the last 168 hours. Coagulation Profile: No results for input(s): "INR", "PROTIME" in the last 168 hours. Cardiac Enzymes: No results for input(s): "CKTOTAL", "CKMB", "CKMBINDEX", "TROPONINI" in the last 168 hours. BNP (last 3 results) No results for input(s): "PROBNP" in the last 8760 hours. HbA1C: No results for input(s): "HGBA1C" in the last 72 hours. CBG: No results for input(s): "GLUCAP" in the last 168 hours. Lipid Profile: No results for input(s): "CHOL", "HDL", "LDLCALC", "TRIG", "CHOLHDL", "LDLDIRECT" in the last 72 hours. Thyroid Function Tests: Recent Labs    05/23/22 0830  TSH 5.667*   Anemia Panel: No results for input(s): "VITAMINB12", "FOLATE", "FERRITIN", "TIBC", "IRON", "RETICCTPCT" in the last 72 hours. Sepsis Labs: No results for input(s): "PROCALCITON", "LATICACIDVEN" in the last 168 hours.  Recent Results (from the past 240 hour(s))  Resp panel by RT-PCR (RSV, Flu A&B, Covid) Anterior Nasal Swab     Status: None   Collection Time: 05/23/22 10:04 AM   Specimen: Anterior Nasal Swab  Result Value Ref  Range Status   SARS Coronavirus 2 by RT PCR NEGATIVE NEGATIVE Final    Comment: (NOTE) SARS-CoV-2 target nucleic acids are NOT DETECTED.  The SARS-CoV-2 RNA is generally detectable in upper respiratory specimens during the acute phase of infection. The lowest concentration of SARS-CoV-2 viral copies this assay can detect is 138 copies/mL. A negative result does not preclude SARS-Cov-2 infection and should not be used as the sole basis for treatment or other patient management decisions. A negative result may occur with  improper specimen collection/handling, submission of specimen other than nasopharyngeal swab, presence of viral mutation(s) within the areas targeted by this assay, and inadequate number of viral copies(<138 copies/mL). A negative result must be combined with clinical observations, patient history, and  epidemiological information. The expected result is Negative.  Fact Sheet for Patients:  EntrepreneurPulse.com.au  Fact Sheet for Healthcare Providers:  IncredibleEmployment.be  This test is no t yet approved or cleared by the [place] FDA and  has been authorized for detection and/or diagnosis of SARS-CoV-2 by FDA under an Emergency Use Authorization (EUA). This EUA will remain  in effect (meaning this test can be used) for the duration of the COVID-19 declaration under Section 564(b)(1) of the Act, 21 U.S.C.section 360bbb-3(b)(1), unless the authorization is terminated  or revoked sooner.       Influenza A by PCR NEGATIVE NEGATIVE Final   Influenza B by PCR NEGATIVE NEGATIVE Final    Comment: (NOTE) The Xpert Xpress SARS-CoV-2/FLU/RSV plus assay is intended as an aid in the diagnosis of influenza from Nasopharyngeal swab specimens and should not be used as a sole basis for treatment. Nasal washings and aspirates are unacceptable for Xpert Xpress SARS-CoV-2/FLU/RSV testing.  Fact Sheet for Patients: EntrepreneurPulse.com.au  Fact Sheet for Healthcare Providers: IncredibleEmployment.be  This test is not yet approved or cleared by the [place] FDA and has been authorized for detection and/or diagnosis of SARS-CoV-2 by FDA under an Emergency Use Authorization (EUA). This EUA will remain in effect (meaning this test can be used) for the duration of the COVID-19 declaration under Section 564(b)(1) of the Act, 21 U.S.C. section 360bbb-3(b)(1), unless the authorization is terminated or revoked.     Resp Syncytial Virus by PCR NEGATIVE NEGATIVE Final    Comment: (NOTE) Fact Sheet for Patients: EntrepreneurPulse.com.au  Fact Sheet for Healthcare Providers: IncredibleEmployment.be  This test is not yet approved or cleared by the [place] FDA and has been  authorized for detection and/or diagnosis of SARS-CoV-2 by FDA under an Emergency Use Authorization (EUA). This EUA will remain in effect (meaning this test can be used) for the duration of the COVID-19 declaration under Section 564(b)(1) of the Act, 21 U.S.C. section 360bbb-3(b)(1), unless the authorization is terminated or revoked.  Performed at [facility], [address] [place], [place] [address]          Radiology Studies: US Venous Img Lower Bilateral (DVT)  Result Date: 05/24/2022 CLINICAL DATA:  Bilateral lower extremity edema. EXAM: BILATERAL LOWER EXTREMITY VENOUS DOPPLER ULTRASOUND TECHNIQUE: Gray-scale sonography with graded compression, as well as color Doppler and duplex ultrasound were performed to evaluate the lower extremity deep venous systems from the level of the common femoral vein and including the common femoral, femoral, profunda femoral, popliteal and calf veins including the posterior tibial, peroneal and gastrocnemius veins when visible. The superficial great saphenous vein was also interrogated. Spectral Doppler was utilized to evaluate flow at rest and with distal augmentation maneuvers in the common femoral, femoral and popliteal  veins. COMPARISON:  None Available. FINDINGS: RIGHT LOWER EXTREMITY Common Femoral Vein: No evidence of thrombus. Normal compressibility, respiratory phasicity and response to augmentation. Saphenofemoral Junction: No evidence of thrombus. Normal compressibility and flow on color Doppler imaging. Profunda Femoral Vein: No evidence of thrombus. Normal compressibility and flow on color Doppler imaging. Femoral Vein: No evidence of thrombus. Normal compressibility, respiratory phasicity and response to augmentation. Popliteal Vein: No evidence of thrombus. Normal compressibility, respiratory phasicity and response to augmentation. Calf Veins: No evidence of thrombus. Normal compressibility and flow on color Doppler imaging. Superficial Great  Saphenous Vein: No evidence of thrombus. Normal compressibility. Venous Reflux:  None. Other Findings:  None. LEFT LOWER EXTREMITY Common Femoral Vein: No evidence of thrombus. Normal compressibility, respiratory phasicity and response to augmentation. Saphenofemoral Junction: No evidence of thrombus. Normal compressibility and flow on color Doppler imaging. Profunda Femoral Vein: No evidence of thrombus. Normal compressibility and flow on color Doppler imaging. Femoral Vein: No evidence of thrombus. Normal compressibility, respiratory phasicity and response to augmentation. Popliteal Vein: No evidence of thrombus. Normal compressibility, respiratory phasicity and response to augmentation. Calf Veins: No evidence of thrombus. Normal compressibility and flow on color Doppler imaging. Superficial Great Saphenous Vein: No evidence of thrombus. Normal compressibility. Venous Reflux:  None. Other Findings:  None. IMPRESSION: No evidence of deep venous thrombosis in either lower extremity. Electronically Signed   By: Marijo Conception M.D.   On: 05/24/2022 14:56   ECHOCARDIOGRAM COMPLETE  Result Date: 05/24/2022    ECHOCARDIOGRAM REPORT   Patient Name:   Carly [name] [facility] Date of Exam: 05/24/2022 Medical Rec #:  [id number]      Height:       61.5 in Accession #:    [id number]     Weight:       143.1 lb Date of Birth:  Sep 24, 1918      BSA:          1.648 m Patient Age:    89 years      BP:           152/51 mmHg Patient Gender: F              HR:           71 bpm. Exam Location:  [facility] Procedure: 2D Echo, 3D Echo, Color Doppler and Strain Analysis Indications:    Dyspnea R06.00  History:        Patient has no prior history of Echocardiogram examinations.                 CAD, Carotid Disease, Signs/Symptoms:Dyspnea and Shortness of                 Breath; Risk Factors:Hypertension, Dyslipidemia and Non-Smoker.  Sonographer:    Greer Pickerel Referring Phys: [id number] COURAGE EMOKPAE  Sonographer Comments: Image acquisition  challenging due to respiratory motion. Global longitudinal strain was attempted. IMPRESSIONS  1. Left ventricular ejection fraction, by estimation, is 65 to 70%. The left ventricle has normal function. The left ventricle has no regional wall motion abnormalities. Left ventricular diastolic parameters are consistent with Grade II diastolic dysfunction (pseudonormalization).  2. Right ventricular systolic function is normal. The right ventricular size is normal. The estimated right ventricular systolic pressure is 71.6 mmHg.  3. Left atrial size was mildly dilated.  4. Possible small left pleural effusion.  5. The mitral valve is grossly normal. Mild to moderate mitral valve regurgitation.  6. The aortic valve is tricuspid. Aortic valve regurgitation  is moderate. No aortic stenosis is present.  7. Aortic dilatation noted. There is mild dilatation of the ascending aorta, measuring 39 mm.  8. The inferior vena cava is normal in size with <50% respiratory variability, suggesting right atrial pressure of 8 mmHg. Comparison(s): No prior Echocardiogram. FINDINGS  Left Ventricle: Left ventricular ejection fraction, by estimation, is 65 to 70%. The left ventricle has normal function. The left ventricle has no regional wall motion abnormalities. The left ventricular internal cavity size was normal in size. There is  borderline left ventricular hypertrophy. Left ventricular diastolic parameters are consistent with Grade II diastolic dysfunction (pseudonormalization). Right Ventricle: The right ventricular size is normal. No increase in right ventricular wall thickness. Right ventricular systolic function is normal. The tricuspid regurgitant velocity is 3.15 m/s, and with an assumed right atrial pressure of 8 mmHg, the estimated right ventricular systolic pressure is 65.9 mmHg. Left Atrium: Left atrial size was mildly dilated. Right Atrium: Right atrial size was normal in size. Pericardium: Possible small left pleural effusion.  There is no evidence of pericardial effusion. Presence of epicardial fat layer. Mitral Valve: The mitral valve is grossly normal. Mild to moderate mitral valve regurgitation. Tricuspid Valve: The tricuspid valve is grossly normal. Tricuspid valve regurgitation is mild. Aortic Valve: The aortic valve is tricuspid. There is mild aortic valve annular calcification. Aortic valve regurgitation is moderate. Aortic regurgitation PHT measures 232 msec. No aortic stenosis is present. Pulmonic Valve: The pulmonic valve was grossly normal. Pulmonic valve regurgitation is trivial. Aorta: The aortic root is normal in size and structure and aortic dilatation noted. There is mild dilatation of the ascending aorta, measuring 39 mm. Venous: The inferior vena cava is normal in size with less than 50% respiratory variability, suggesting right atrial pressure of 8 mmHg. IAS/Shunts: No atrial level shunt detected by color flow Doppler.  LEFT VENTRICLE PLAX 2D LVIDd:         3.90 cm   Diastology LVIDs:         2.50 cm   LV e' medial:    5.07 cm/s LV PW:         1.00 cm   LV E/e' medial:  25.8 LV IVS:        1.00 cm   LV e' lateral:   4.51 cm/s LVOT diam:     1.60 cm   LV E/e' lateral: 29.0 LV SV:         54 LV SV Index:   33 LVOT Area:     2.01 cm                           3D Volume EF:                          3D EF:        49 %                          LV EDV:       116 ml                          LV ESV:       60 ml                          LV SV:  56 ml RIGHT VENTRICLE RV S prime:     12.30 cm/s TAPSE (M-mode): 1.7 cm LEFT ATRIUM             Index        RIGHT ATRIUM           Index LA diam:        3.00 cm 1.82 cm/m   RA Area:     12.10 cm LA Vol (A2C):   51.0 ml 30.94 ml/m  RA Volume:   26.80 ml  16.26 ml/m LA Vol (A4C):   56.6 ml 34.34 ml/m LA Biplane Vol: 54.3 ml 32.95 ml/m  AORTIC VALVE             PULMONIC VALVE LVOT Vmax:   108.00 cm/s PR End Diast Vel: 5.11 msec LVOT Vmean:  78.500 cm/s LVOT VTI:    0.270 m AI  PHT:      232 msec  AORTA Ao Root diam: 3.30 cm Ao Asc diam:  3.90 cm MITRAL VALVE                TRICUSPID VALVE MV Area (PHT): 3.08 cm     TR Peak grad:   39.7 mmHg MV Decel Time: 246 msec     TR Vmax:        315.00 cm/s MR Peak grad: 111.7 mmHg MR Vmax:      528.33 cm/s   SHUNTS MV E velocity: 131.00 cm/s  Systemic VTI:  0.27 m MV A velocity: 130.00 cm/s  Systemic Diam: 1.60 cm MV E/A ratio:  1.01 Rozann Lesches MD Electronically signed by Rozann Lesches MD Signature Date/Time: 05/24/2022/12:47:03 PM    Final    DG Chest Port 1 View  Result Date: 05/23/2022 CLINICAL DATA:  Shortness of breath. EXAM: PORTABLE CHEST 1 VIEW COMPARISON:  12/08/2017 FINDINGS: Lungs are hyperexpanded. There is some minimal atelectasis or infiltrate at the left base with tiny bilateral pleural effusions. Interstitial markings are diffusely coarsened with chronic features. Cardiopericardial silhouette is at upper limits of normal for size. Telemetry leads overlie the chest. IMPRESSION: Hyperexpansion with minimal atelectasis or infiltrate at the left base and tiny bilateral pleural effusions. Electronically Signed   By: Misty Stanley M.D.   On: 05/23/2022 10:20        Scheduled Meds:  calcium carbonate  1 tablet Oral Daily   furosemide  60 mg Intravenous BID   metoprolol succinate  50 mg Oral BID   sodium bicarbonate  650 mg Oral BID   sodium chloride flush  3 mL Intravenous Q12H   sodium chloride flush  3 mL Intravenous Q12H   Continuous Infusions:  sodium chloride       LOS: 1 day    Time spent: 35 minutes    Jermari Tamargo Darleen Crocker, DO [facility]  If 7PM-7AM, please contact night-coverage [email or web address] 05/25/2022, 8:54 AM

## 2022-05-25 NOTE — Care Management Important Message (Signed)
Important Message  Patient Details  Name: Carly [name] MRN: [id number] Date of Birth: [birth year]   Medicare Important Message Given:  N/A - LOS <3 / Initial given by admissions     Tommy Medal 05/25/2022, 10:49 AM

## 2022-05-25 NOTE — Consult Note (Signed)
Nephrology Consult   Assessment/Recommendations:   CKD5 -followed by Dr. Marlise Eves. Not a candidate for HD which is in line with patient's wishes. Palliative care following -It does seem that she has had some mild uremic symptoms for quite some time -Rise in Cr to 4 today with increased diuresis. Likely does not have much renal reserve. Although her Cr is on the rise, we still need to offload fluid given that dialysis is not in the picture. Will continue with lasix 60mg  IV BID for today -will check urine sediment -if further rise in Cr, would recommend checking a renal ultrasound -Avoid nephrotoxic medications including NSAIDs and iodinated intravenous contrast exposure unless the latter is absolutely indicated.  Preferred narcotic agents for pain control are hydromorphone, fentanyl, and methadone. Morphine should not be used. Avoid Baclofen and avoid oral sodium phosphate and magnesium citrate based laxatives / bowel preps. Continue strict Input and Output monitoring. Will monitor the patient closely with you and intervene or adjust therapy as indicated by changes in clinical status/labs   AHRF -likely secondary to CHF/volume -currently on lasix 60mg  IV BID, will currently stay the course. Watch strict I/O and daily weights. If no further improvement volume status and/or resp status then would increase her lasix to 80mg  IV BID, would recommend repeating CXR to see where we're at with pulm edema if that is the case  HTN -BP currently acceptable  AOCD -transfuse prn for hgb <7 -hgb downtrending. Would recommend ruling out bleed. Discussed with primary service  Secondary Hyperparathyroidism -check PO4  Metabolic acidosis -on PO nahco3, bicarb acceptable  Recommendations conveyed to primary service.   [facility] 05/25/2022 7:58 AM  _____________________________________________________________________________________   History of Present Illness: Carly [name] is a/an 89 y.o. female with a past medical history of CKD5 (followed by Dr. Theador Hawthorne), h/o transitional renal cell carcinoma s/p right nephrouretectomy 2007, proteinuria, CAD, HTN, gout  who presents to [facility] with sudden onset SOB, found to be volume overloaded. Discussed with primary service on 2/14, increased lasix to 60mg  IV BID. Palliative care following. Patient is DNR/DNI. She follows with Dr. Theador Hawthorne at [facility] and they have discussed dialysis in the past and they have decided not to pursue this especially given advanced age. Discussed with patient's daughter outside the room at length in regards to trajectory of disease and shared my concerns in regards to long term plan. Understandably, this is a rough conversation for her. Patient seen and examined bedside. She reports that she has had itchiness, tremors, and lack of taste (dysgeusia) for a very long time. She does report that she wants to be kept comfortable.Breathing is about the same. Was able to get into the recliner for most of the day yesterday and did not have increased SOB when transferring to the chair. Swelling seems to have improved as well. In regards to appetite, eats a little bit at a time. She otherwise denies chest pain, n/v, brain fog, intractable hiccups.   Medications:  Current Facility-Administered Medications  Medication Dose Route Frequency Provider Last Rate Last Admin   0.9 %  sodium chloride infusion   Intravenous PRN Emokpae, Courage, MD       acetaminophen (TYLENOL) tablet 650 mg  650 mg Oral Q6H PRN Emokpae, Courage, MD   650 mg at 05/23/22 2353   Or   acetaminophen (TYLENOL) suppository 650 mg  650 mg Rectal Q6H PRN Emokpae, Courage, MD       albuterol (PROVENTIL) (2.5 MG/3ML) 0.083% nebulizer  solution 2.5 mg  2.5 mg Nebulization Q2H PRN Roxan Hockey, MD       aspirin EC tablet 81 mg  81 mg Oral Daily Emokpae, Courage, MD   81 mg at 05/24/22 1009   bisacodyl (DULCOLAX) suppository 10 mg  10 mg Rectal  Daily PRN Roxan Hockey, MD       calcium carbonate (TUMS - dosed in mg elemental calcium) chewable tablet 200 mg of elemental calcium  1 tablet Oral Daily Emokpae, Courage, MD   200 mg of elemental calcium at 05/24/22 1009   furosemide (LASIX) injection 60 mg  60 mg Intravenous BID Manuella Ghazi, Pratik D, DO   60 mg at 05/24/22 1702   heparin injection 5,000 Units  5,000 Units Subcutaneous Q8H Emokpae, Courage, MD       hydrALAZINE (APRESOLINE) injection 10 mg  10 mg Intravenous Q6H PRN Emokpae, Courage, MD       metoprolol succinate (TOPROL-XL) 24 hr tablet 50 mg  50 mg Oral BID Denton Brick, Courage, MD   50 mg at 05/24/22 2120   ondansetron (ZOFRAN) tablet 4 mg  4 mg Oral Q6H PRN Emokpae, Courage, MD       Or   ondansetron (ZOFRAN) injection 4 mg  4 mg Intravenous Q6H PRN Emokpae, Courage, MD   4 mg at 05/23/22 2352   polyethylene glycol (MIRALAX / GLYCOLAX) packet 17 g  17 g Oral Daily PRN Emokpae, Courage, MD       sodium bicarbonate tablet 650 mg  650 mg Oral BID Emokpae, Courage, MD   650 mg at 05/24/22 2119   sodium chloride flush (NS) 0.9 % injection 3 mL  3 mL Intravenous Q12H Emokpae, Courage, MD       sodium chloride flush (NS) 0.9 % injection 3 mL  3 mL Intravenous Q12H Emokpae, Courage, MD   3 mL at 05/24/22 2120   sodium chloride flush (NS) 0.9 % injection 3 mL  3 mL Intravenous PRN Emokpae, Courage, MD       traZODone (DESYREL) tablet 50 mg  50 mg Oral QHS PRN Denton Brick, Courage, MD   50 mg at 05/23/22 2358     ALLERGIES Patient has no known allergies.  MEDICAL HISTORY Past Medical History:  Diagnosis Date   CAD (coronary artery disease)    a. s/p PTCA/stent to LAD and PTCA of ramus in 2003. b. normal nuc 10/2012 with EF 87%   Carotid arterial disease ([place])    a. 07/2014 - duplex 93-23% RICA, 5-57% LICA.   CKD (chronic kidney disease), stage III (HCC)    Cystocele    Dyslipidemia    Gout    HTN (hypertension)    Transitional cell carcinoma ([place])    a. transitional cell carcinoma  of the right pelvic ureter s/p right nephroureterectomy 08/2005.   Vaginal wall prolapse      SOCIAL HISTORY Social History   Socioeconomic History   Marital status: Widowed    Spouse name: Not on file   Number of children: 2   Years of education: Not on file   Highest education level: Not on file  Occupational History   Not on file  Tobacco Use   Smoking status: Never   Smokeless tobacco: Never  Vaping Use   Vaping Use: Never used  Substance and Sexual Activity   Alcohol use: No   Drug use: No   Sexual activity: Never  Other Topics Concern   Not on file  Social History Narrative   Not on  file   Social Determinants of Health   Financial Resource Strain: Not on file  Food Insecurity: No Food Insecurity (05/23/2022)   Hunger Vital Sign    Worried About Running Out of Food in the Last Year: Never true    Ran Out of Food in the Last Year: Never true  Transportation Needs: No Transportation Needs (05/23/2022)   PRAPARE - Hydrologist (Medical): No    Lack of Transportation (Non-Medical): No  Physical Activity: Not on file  Stress: Not on file  Social Connections: Not on file  Intimate Partner Violence: Not At Risk (05/23/2022)   Humiliation, Afraid, Rape, and Kick questionnaire    Fear of Current or Ex-Partner: No    Emotionally Abused: No    Physically Abused: No    Sexually Abused: No     FAMILY HISTORY Family History  Problem Relation Age of Onset   Heart attack Father    Diabetes Father    Cancer Unknown    Heart disease Unknown      Review of Systems: 12 systems reviewed Otherwise as per HPI, all other systems reviewed and negative  Physical Exam: Vitals:   05/24/22 2119 05/25/22 0441  BP: (!) 153/61 (!) 118/54  Pulse: 72 66  Resp: 16 18  Temp: (!) 97.4 F (36.3 C) 98.2 F (36.8 C)  SpO2: 92% 90%   Total I/O In: -  Out: 400 [Urine:400]  Intake/Output Summary (Last 24 hours) at 05/25/2022 0758 Last data filed at  05/25/2022 0755 Gross per 24 hour  Intake --  Output 650 ml  Net -650 ml   General: well-appearing, no acute distress HEENT: anicteric sclera, oropharynx clear without lesions CV: regular rate, normal rhythm, no murmurs, no gallops, no rubs Lungs: decreased breath sounds bibasilar, no overt w/r/r/c, on NCO2. Not in resp distress Abd: soft, non-tender, non-distended Skin: no visible lesions or rashes Musculoskeletal: trace edema b/l Les (stockings in place) Neuro: normal speech, no gross focal deficits, no asterixis   Test Results Reviewed Lab Results  Component Value Date   NA 138 05/25/2022   K 4.3 05/25/2022   CL 107 05/25/2022   CO2 23 05/25/2022   BUN 93 (H) 05/25/2022   CREATININE 4.00 (H) 05/25/2022   GFR 32.85 (L) 07/01/2014   CALCIUM 7.6 (L) 05/25/2022   ALBUMIN 2.8 (L) 05/24/2022   PHOS 5.0 (H) 08/16/2021     I have reviewed all relevant outside healthcare records related to the patient's kidney injury.

## 2022-05-25 NOTE — Progress Notes (Signed)
Patient slept through the night. New IV was obtained this shift the previous IV began to leak when flushed. Continued to monitor patient.

## 2022-05-26 ENCOUNTER — Inpatient Hospital Stay (HOSPITAL_COMMUNITY): Payer: Medicare Other

## 2022-05-26 DIAGNOSIS — J9601 Acute respiratory failure with hypoxia: Secondary | ICD-10-CM | POA: Diagnosis not present

## 2022-05-26 LAB — CBC
HCT: 25.5 % — ABNORMAL LOW (ref 36.0–46.0)
Hemoglobin: 7.8 g/dL — ABNORMAL LOW (ref 12.0–15.0)
MCH: 30.4 pg (ref 26.0–34.0)
MCHC: 30.6 g/dL (ref 30.0–36.0)
MCV: 99.2 fL (ref 80.0–100.0)
Platelets: 158 10*3/uL (ref 150–400)
RBC: 2.57 MIL/uL — ABNORMAL LOW (ref 3.87–5.11)
RDW: 15.3 % (ref 11.5–15.5)
WBC: 9.2 10*3/uL (ref 4.0–10.5)
nRBC: 0 % (ref 0.0–0.2)

## 2022-05-26 LAB — BASIC METABOLIC PANEL
Anion gap: 11 (ref 5–15)
BUN: 96 mg/dL — ABNORMAL HIGH (ref 8–23)
CO2: 25 mmol/L (ref 22–32)
Calcium: 7.7 mg/dL — ABNORMAL LOW (ref 8.9–10.3)
Chloride: 103 mmol/L (ref 98–111)
Creatinine, Ser: 4.3 mg/dL — ABNORMAL HIGH (ref 0.44–1.00)
GFR, Estimated: 9 mL/min — ABNORMAL LOW (ref >60)
Glucose, Bld: 93 mg/dL (ref 70–99)
Potassium: 4.4 mmol/L (ref 3.5–5.1)
Sodium: 139 mmol/L (ref 135–145)

## 2022-05-26 LAB — MAGNESIUM: Magnesium: 2.1 mg/dL (ref 1.7–2.4)

## 2022-05-26 MED ORDER — FUROSEMIDE 10 MG/ML IJ SOLN
40.0000 mg | Freq: Two times a day (BID) | INTRAMUSCULAR | Status: DC
  Administered 2022-05-26 – 2022-05-28 (×4): 40 mg via INTRAVENOUS
  Filled 2022-05-26 (×5): qty 4

## 2022-05-26 MED ORDER — SODIUM CHLORIDE 0.9 % IV SOLN
250.0000 mg | Freq: Every day | INTRAVENOUS | Status: AC
  Administered 2022-05-26 – 2022-05-27 (×2): 250 mg via INTRAVENOUS
  Filled 2022-05-26 (×2): qty 250

## 2022-05-26 NOTE — Progress Notes (Signed)
[facility] Progress Note    Assessment/ Plan:   CKD5 -followed by Dr. Marlise Eves. Not a candidate for HD which is in line with patient's wishes. Palliative care following -It does seem that she has had some mild uremic symptoms for quite some time -Rise in Cr to 4.3 today with diuresis. Likely does not have much renal reserve given solitary kidney. Although her Cr is on the rise, we still need to offload fluid given that dialysis is not in the picture. I do believe that her volume status is improving. Will decrease her lasix to 40mg  IV BID, can hopefully transition to PO lasix in the next day or so -urine sediment without casts -will check renal ultrasound given progressive rise in Cr -Avoid nephrotoxic medications including NSAIDs and iodinated intravenous contrast exposure unless the latter is absolutely indicated.  Preferred narcotic agents for pain control are hydromorphone, fentanyl, and methadone. Morphine should not be used. Avoid Baclofen and avoid oral sodium phosphate and magnesium citrate based laxatives / bowel preps. Continue strict Input and Output monitoring. Will monitor the patient closely with you and intervene or adjust therapy as indicated by changes in clinical status/labs    AHRF -likely secondary to CHF/volume -as above, will decrease lasix to 40mg  IV BID, will currently stay the course. Watch strict I/O and daily weights. If no further improvement volume status and/or resp status then would increase her lasix. If that is the case then would recommend repeating CXR to see what her volume status is -can hopefully transition to PO lasix in the next 24-48hrs. Would recommend lasix 40mg  PO BID   HTN -BP currently acceptable   AOCD -transfuse prn for hgb <7 -iron deficient on panel--receiving IV FE -FOBT pending   Secondary Hyperparathyroidism -PO4 5.2, reasonable. Can start renvela 1 tab [facility] if >3.6   Metabolic acidosis -on PO nahco3, bicarb  WNL  Will be monitoring chart over the weekend. Will be available. Please call covering nephrologist with any questions/concerns.  Gean Quint, MD [facility]  Subjective:   Patient seen and examined bedside. Daughter at bedside. She does report some pain in her feet. Really no appetite today. She reports that her breathing is about the same Afib overnight, rate controlled UOP charted: 1L with 2 unmeasured voids Weight is down to 62.6kg   Objective:   BP 132/73 (BP Location: Left Arm)   Pulse 66   Temp 97.6 F (36.4 C) (Oral)   Resp 18   Ht 5' 1.5" (1.562 m)   Wt 62.6 kg   SpO2 93%   BMI 25.65 kg/m   Intake/Output Summary (Last 24 hours) at 05/26/2022 1443 Last data filed at 05/26/2022 0500 Gross per 24 hour  Intake 480 ml  Output 600 ml  Net -120 ml   Weight change: -2.4 kg  Physical Exam: Gen: NAD CVS: irreg irreg Resp: diminished air entry bibasilar, normal WOB/unlabored Abd: soft Ext: no edema Neuro: awake/alert  Imaging: US Venous Img Lower Bilateral (DVT)  Result Date: 05/24/2022 CLINICAL DATA:  Bilateral lower extremity edema. EXAM: BILATERAL LOWER EXTREMITY VENOUS DOPPLER ULTRASOUND TECHNIQUE: Gray-scale sonography with graded compression, as well as color Doppler and duplex ultrasound were performed to evaluate the lower extremity deep venous systems from the level of the common femoral vein and including the common femoral, femoral, profunda femoral, popliteal and calf veins including the posterior tibial, peroneal and gastrocnemius veins when visible. The superficial great saphenous vein was also interrogated. Spectral Doppler was utilized to evaluate  flow at rest and with distal augmentation maneuvers in the common femoral, femoral and popliteal veins. COMPARISON:  None Available. FINDINGS: RIGHT LOWER EXTREMITY Common Femoral Vein: No evidence of thrombus. Normal compressibility, respiratory phasicity and response to augmentation. Saphenofemoral  Junction: No evidence of thrombus. Normal compressibility and flow on color Doppler imaging. Profunda Femoral Vein: No evidence of thrombus. Normal compressibility and flow on color Doppler imaging. Femoral Vein: No evidence of thrombus. Normal compressibility, respiratory phasicity and response to augmentation. Popliteal Vein: No evidence of thrombus. Normal compressibility, respiratory phasicity and response to augmentation. Calf Veins: No evidence of thrombus. Normal compressibility and flow on color Doppler imaging. Superficial Great Saphenous Vein: No evidence of thrombus. Normal compressibility. Venous Reflux:  None. Other Findings:  None. LEFT LOWER EXTREMITY Common Femoral Vein: No evidence of thrombus. Normal compressibility, respiratory phasicity and response to augmentation. Saphenofemoral Junction: No evidence of thrombus. Normal compressibility and flow on color Doppler imaging. Profunda Femoral Vein: No evidence of thrombus. Normal compressibility and flow on color Doppler imaging. Femoral Vein: No evidence of thrombus. Normal compressibility, respiratory phasicity and response to augmentation. Popliteal Vein: No evidence of thrombus. Normal compressibility, respiratory phasicity and response to augmentation. Calf Veins: No evidence of thrombus. Normal compressibility and flow on color Doppler imaging. Superficial Great Saphenous Vein: No evidence of thrombus. Normal compressibility. Venous Reflux:  None. Other Findings:  None. IMPRESSION: No evidence of deep venous thrombosis in either lower extremity. Electronically Signed   By: Marijo Conception M.D.   On: 05/24/2022 14:56   ECHOCARDIOGRAM COMPLETE  Result Date: 05/24/2022    ECHOCARDIOGRAM REPORT   Patient Name:   Carly [name] [facility] Date of Exam: 05/24/2022 Medical Rec #:  [id number]      Height:       61.5 in Accession #:    [id number]     Weight:       143.1 lb Date of Birth:  1918/05/03      BSA:          1.648 m Patient Age:    89 years      BP:            152/51 mmHg Patient Gender: F              HR:           71 bpm. Exam Location:  [facility] Procedure: 2D Echo, 3D Echo, Color Doppler and Strain Analysis Indications:    Dyspnea R06.00  History:        Patient has no prior history of Echocardiogram examinations.                 CAD, Carotid Disease, Signs/Symptoms:Dyspnea and Shortness of                 Breath; Risk Factors:Hypertension, Dyslipidemia and Non-Smoker.  Sonographer:    Greer Pickerel Referring Phys: [id number] COURAGE EMOKPAE  Sonographer Comments: Image acquisition challenging due to respiratory motion. Global longitudinal strain was attempted. IMPRESSIONS  1. Left ventricular ejection fraction, by estimation, is 65 to 70%. The left ventricle has normal function. The left ventricle has no regional wall motion abnormalities. Left ventricular diastolic parameters are consistent with Grade II diastolic dysfunction (pseudonormalization).  2. Right ventricular systolic function is normal. The right ventricular size is normal. The estimated right ventricular systolic pressure is 12.8 mmHg.  3. Left atrial size was mildly dilated.  4. Possible small left pleural effusion.  5. The mitral valve is grossly normal. Mild  to moderate mitral valve regurgitation.  6. The aortic valve is tricuspid. Aortic valve regurgitation is moderate. No aortic stenosis is present.  7. Aortic dilatation noted. There is mild dilatation of the ascending aorta, measuring 39 mm.  8. The inferior vena cava is normal in size with <50% respiratory variability, suggesting right atrial pressure of 8 mmHg. Comparison(s): No prior Echocardiogram. FINDINGS  Left Ventricle: Left ventricular ejection fraction, by estimation, is 65 to 70%. The left ventricle has normal function. The left ventricle has no regional wall motion abnormalities. The left ventricular internal cavity size was normal in size. There is  borderline left ventricular hypertrophy. Left ventricular diastolic parameters are  consistent with Grade II diastolic dysfunction (pseudonormalization). Right Ventricle: The right ventricular size is normal. No increase in right ventricular wall thickness. Right ventricular systolic function is normal. The tricuspid regurgitant velocity is 3.15 m/s, and with an assumed right atrial pressure of 8 mmHg, the estimated right ventricular systolic pressure is 16.1 mmHg. Left Atrium: Left atrial size was mildly dilated. Right Atrium: Right atrial size was normal in size. Pericardium: Possible small left pleural effusion. There is no evidence of pericardial effusion. Presence of epicardial fat layer. Mitral Valve: The mitral valve is grossly normal. Mild to moderate mitral valve regurgitation. Tricuspid Valve: The tricuspid valve is grossly normal. Tricuspid valve regurgitation is mild. Aortic Valve: The aortic valve is tricuspid. There is mild aortic valve annular calcification. Aortic valve regurgitation is moderate. Aortic regurgitation PHT measures 232 msec. No aortic stenosis is present. Pulmonic Valve: The pulmonic valve was grossly normal. Pulmonic valve regurgitation is trivial. Aorta: The aortic root is normal in size and structure and aortic dilatation noted. There is mild dilatation of the ascending aorta, measuring 39 mm. Venous: The inferior vena cava is normal in size with less than 50% respiratory variability, suggesting right atrial pressure of 8 mmHg. IAS/Shunts: No atrial level shunt detected by color flow Doppler.  LEFT VENTRICLE PLAX 2D LVIDd:         3.90 cm   Diastology LVIDs:         2.50 cm   LV e' medial:    5.07 cm/s LV PW:         1.00 cm   LV E/e' medial:  25.8 LV IVS:        1.00 cm   LV e' lateral:   4.51 cm/s LVOT diam:     1.60 cm   LV E/e' lateral: 29.0 LV SV:         54 LV SV Index:   33 LVOT Area:     2.01 cm                           3D Volume EF:                          3D EF:        49 %                          LV EDV:       116 ml                          LV ESV:        60 ml  LV SV:        56 ml RIGHT VENTRICLE RV S prime:     12.30 cm/s TAPSE (M-mode): 1.7 cm LEFT ATRIUM             Index        RIGHT ATRIUM           Index LA diam:        3.00 cm 1.82 cm/m   RA Area:     12.10 cm LA Vol (A2C):   51.0 ml 30.94 ml/m  RA Volume:   26.80 ml  16.26 ml/m LA Vol (A4C):   56.6 ml 34.34 ml/m LA Biplane Vol: 54.3 ml 32.95 ml/m  AORTIC VALVE             PULMONIC VALVE LVOT Vmax:   108.00 cm/s PR End Diast Vel: 5.11 msec LVOT Vmean:  78.500 cm/s LVOT VTI:    0.270 m AI PHT:      232 msec  AORTA Ao Root diam: 3.30 cm Ao Asc diam:  3.90 cm MITRAL VALVE                TRICUSPID VALVE MV Area (PHT): 3.08 cm     TR Peak grad:   39.7 mmHg MV Decel Time: 246 msec     TR Vmax:        315.00 cm/s MR Peak grad: 111.7 mmHg MR Vmax:      528.33 cm/s   SHUNTS MV E velocity: 131.00 cm/s  Systemic VTI:  0.27 m MV A velocity: 130.00 cm/s  Systemic Diam: 1.60 cm MV E/A ratio:  1.01 Rozann Lesches MD Electronically signed by Rozann Lesches MD Signature Date/Time: 05/24/2022/12:47:03 PM    Final     Labs: BMET Recent Labs  Lab 05/23/22 1004 05/24/22 0443 05/25/22 0447 05/25/22 1048 05/26/22 0416  NA 140 141 138  --  139  K 4.6 4.7 4.3  --  4.4  CL 108 110 107  --  103  CO2 20* 20* 23  --  25  GLUCOSE 123* 129* 89  --  93  BUN 96* 94* 93*  --  96*  CREATININE 3.73* 3.76* 4.00*  --  4.30*  CALCIUM 8.5* 8.0* 7.6*  --  7.7*  PHOS  --   --   --  5.2*  --    CBC Recent Labs  Lab 05/23/22 1004 05/24/22 0443 05/25/22 0447 05/26/22 0416  WBC 10.2 14.5* 9.4 9.2  HGB 10.2* 8.7* 7.8* 7.8*  HCT 32.4* 27.2* 24.5* 25.5*  MCV 98.2 97.5 99.2 99.2  PLT 206 174 153 158    Medications:     calcium carbonate  1 tablet Oral Daily   furosemide  60 mg Intravenous BID   metoprolol succinate  50 mg Oral BID   sodium bicarbonate  650 mg Oral BID   sodium chloride flush  3 mL Intravenous Q12H      Gean Quint, MD [facility] 05/26/2022,  9:11 AM

## 2022-05-26 NOTE — Progress Notes (Signed)
Patient slept this whole shift. Patient did run afib this shift. Continued to monitor patient, no complaints of pain.

## 2022-05-26 NOTE — Progress Notes (Signed)
PROGRESS NOTE    Carly [name]  NLG:[id number] DOB: November 12, 1918 DOA: 05/23/2022 PCP: Celene Squibb, MD   Brief Narrative:    Carly [name]  is a 89 y.o. female past medical history relevant for CKD 5,  history of transitional cell carcinoma of the right kidney and she is status post right nephro ureterectomy in May 2027, -Baseline creatinine from Care Everywhere from Dr. Toya Smothers office is 3.2-3.4, -Creatinine up to 3.73 today,,-Bicarb is 20 , history of hypertension, chronic anemia of CKD ( hemoglobin currently above 10 which is close to recent baseline), h/o CAD (s/p PTCA/Stent to the LAD and PTCA to ramus in 2003)/H/o PAD with known carotid artery stenosis.  She presented to the ED with sudden onset shortness of breath and was admitted for acute hypoxemic respiratory failure in the setting of volume overload.Appreciate nephrology recommendations for continued IV diuresis.  Noted to have worsening anemia that is stable and with some iron deficiency for which she will receive some IV iron.  She continues on IV Lasix nephrology direction and can be transitioned hopefully to oral Lasix in the next 1-2 days.  Discussion had with patient and family regarding home hospice on 2/16 and they are agreeable to this plan.  Assessment & Plan:   Principal Problem:   Acute respiratory failure with hypoxia (HCC) Active Problems:   Hypertensive heart and kidney disease with HF and CKD stage V (HCC)   CKD (chronic kidney disease), stage V (HCC)   HTN (hypertension)   HYPERTENSION, BENIGN   CAD, NATIVE VESSEL   Carotid arterial disease (HCC)   Dysphagia   Acute CHF (HCC)  Assessment and Plan:   1) acute hypoxic respiratory failure-likely secondary to CHF exacerbation with noted volume overload as patient has been off of Lasix -Elevated BNP noted at 877 (baseline is not available) -Chest x-ray with tiny pleural effusions possible CHF findings -Chronic leg edema noted -IV Lasix as ordered per nephrology  recommendations, likely can transition to oral Lasix in a.m. -Daily weight and fluid input and output monitoring -Venous Dopplers negative for DVT -COVID, flu and RSV negative -No productive cough or leukocytosis, no fevers or chills Initial troponin is 10 repeat is 11, EKG sinus without acute changes -In the ED patient required up to 4 L of oxygen via nasal cannula   2) CKD 5--- patient has history of transitional cell carcinoma of the right kidney and she is status post right nephro ureterectomy in May 2027 -Baseline creatinine from Care Everywhere from Dr. Toya Smothers office is 3.2-3.4 -Creatinine up to 4 today -Bicarb is 23 -Continue sodium bicarb supplements -Watch for possible worsening renal function with aggressive IV diuresis -As per patient and family she was previously told she is not a candidate for hemodialysis -renally adjust medications, avoid nephrotoxic agents / dehydration  / hypotension -Appreciate nephrology evaluation.  Dr. Candiss Norse planning to maintain on IV Lasix through today and then can transition to oral Lasix 40 mg p.o. twice daily discharge. Hopefully can DC in  the next 1-2 days with home hospice services as prognosis is days to weeks.     3)Social/Ethics---Discussed with daughters Estill Batten and Windell Hummingbird and granddaughter Arts development officer -Patient is a DNR/DNI -Please see MOST form completed -Appreciate palliative consultation pending   4) acute on chronic anemia of CKD--- hemoglobin currently above 10 which is close to recent baseline -No bleeding concerns -Hemoglobin is currently stable, monitor CBC -FOBT pending -Noted to be iron deficient and started on supplementation today   5)CAD (  s/p PTCA/Stent to the LAD and PTCA to ramus in 2003)/H/o PAD with known carotid artery stenosis --- Currently chest pain-free. Initial troponin is 10 repeat is 11, EKG sinus without acute changes -Continue metoprolol -Hold simvastatin    DVT prophylaxis:SCDs Code Status:  DNR Family Communication: Daughters at bedside 2/16 Disposition Plan:  Status is: Inpatient Remains inpatient appropriate because: Need for IV medications.   Consultants:  Nephrology Palliative  Procedures:  None  Antimicrobials:  None   Subjective: Patient seen and evaluated today with no new acute complaints or concerns. No acute concerns or events noted overnight.  She denies any significant shortness of breath this morning.  Objective: Vitals:   05/25/22 1525 05/25/22 2059 05/26/22 0442 05/26/22 0500  BP: (!) 164/53 137/64 132/73   Pulse: 76 71 66   Resp: 20 18 18    Temp: 98.5 F (36.9 C) 98.2 F (36.8 C) 97.6 F (36.4 C)   TempSrc: Oral  Oral   SpO2: 92% 95% 93%   Weight:    62.6 kg  Height:        Intake/Output Summary (Last 24 hours) at 05/26/2022 1151 Last data filed at 05/26/2022 0900 Gross per 24 hour  Intake 480 ml  Output 700 ml  Net -220 ml   Filed Weights   05/24/22 0500 05/25/22 0500 05/26/22 0500  Weight: 64.9 kg 65 kg 62.6 kg    Examination:  General exam: Appears calm and comfortable  Respiratory system: Clear to auscultation. Respiratory effort normal.  2 L nasal cannula Cardiovascular system: S1 & S2 heard, RRR.  Gastrointestinal system: Abdomen is soft Central nervous system: Alert and awake Extremities: No edema Skin: No significant lesions noted Psychiatry: Flat affect.    Data Reviewed: I have personally reviewed following labs and imaging studies  CBC: Recent Labs  Lab 05/23/22 1004 05/24/22 0443 05/25/22 0447 05/26/22 0416  WBC 10.2 14.5* 9.4 9.2  HGB 10.2* 8.7* 7.8* 7.8*  HCT 32.4* 27.2* 24.5* 25.5*  MCV 98.2 97.5 99.2 99.2  PLT 206 174 153 443   Basic Metabolic Panel: Recent Labs  Lab 05/23/22 1004 05/24/22 0443 05/25/22 0447 05/25/22 1048 05/26/22 0416  NA 140 141 138  --  139  K 4.6 4.7 4.3  --  4.4  CL 108 110 107  --  103  CO2 20* 20* 23  --  25  GLUCOSE 123* 129* 89  --  93  BUN 96* 94* 93*  --   96*  CREATININE 3.73* 3.76* 4.00*  --  4.30*  CALCIUM 8.5* 8.0* 7.6*  --  7.7*  MG  --   --  2.1  --  2.1  PHOS  --   --   --  5.2*  --    GFR: Estimated Creatinine Clearance: 5.8 mL/min (A) (by C-G formula based on SCr of 4.3 mg/dL (H)). Liver Function Tests: Recent Labs  Lab 05/23/22 1004 05/24/22 0443  AST 20 16  ALT 15 12  ALKPHOS 47 35*  BILITOT 0.6 0.5  PROT 6.7 5.4*  ALBUMIN 3.6 2.8*   No results for input(s): "LIPASE", "AMYLASE" in the last 168 hours. No results for input(s): "AMMONIA" in the last 168 hours. Coagulation Profile: No results for input(s): "INR", "PROTIME" in the last 168 hours. Cardiac Enzymes: No results for input(s): "CKTOTAL", "CKMB", "CKMBINDEX", "TROPONINI" in the last 168 hours. BNP (last 3 results) No results for input(s): "PROBNP" in the last 8760 hours. HbA1C: No results for input(s): "HGBA1C" in the last 72 hours.  CBG: No results for input(s): "GLUCAP" in the last 168 hours. Lipid Profile: No results for input(s): "CHOL", "HDL", "LDLCALC", "TRIG", "CHOLHDL", "LDLDIRECT" in the last 72 hours. Thyroid Function Tests: No results for input(s): "TSH", "T4TOTAL", "FREET4", "T3FREE", "THYROIDAB" in the last 72 hours.  Anemia Panel: Recent Labs    05/25/22 1048  VITAMINB12 325  FOLATE 10.2  FERRITIN 28  TIBC 311  IRON 27*  RETICCTPCT 2.9   Sepsis Labs: No results for input(s): "PROCALCITON", "LATICACIDVEN" in the last 168 hours.  Recent Results (from the past 240 hour(s))  Resp panel by RT-PCR (RSV, Flu A&B, Covid) Anterior Nasal Swab     Status: None   Collection Time: 05/23/22 10:04 AM   Specimen: Anterior Nasal Swab  Result Value Ref Range Status   SARS Coronavirus 2 by RT PCR NEGATIVE NEGATIVE Final    Comment: (NOTE) SARS-CoV-2 target nucleic acids are NOT DETECTED.  The SARS-CoV-2 RNA is generally detectable in upper respiratory specimens during the acute phase of infection. The lowest concentration of SARS-CoV-2 viral  copies this assay can detect is 138 copies/mL. A negative result does not preclude SARS-Cov-2 infection and should not be used as the sole basis for treatment or other patient management decisions. A negative result may occur with  improper specimen collection/handling, submission of specimen other than nasopharyngeal swab, presence of viral mutation(s) within the areas targeted by this assay, and inadequate number of viral copies(<138 copies/mL). A negative result must be combined with clinical observations, patient history, and epidemiological information. The expected result is Negative.  Fact Sheet for Patients:  EntrepreneurPulse.com.au  Fact Sheet for Healthcare Providers:  IncredibleEmployment.be  This test is no t yet approved or cleared by the [place] FDA and  has been authorized for detection and/or diagnosis of SARS-CoV-2 by FDA under an Emergency Use Authorization (EUA). This EUA will remain  in effect (meaning this test can be used) for the duration of the COVID-19 declaration under Section 564(b)(1) of the Act, 21 U.S.C.section 360bbb-3(b)(1), unless the authorization is terminated  or revoked sooner.       Influenza A by PCR NEGATIVE NEGATIVE Final   Influenza B by PCR NEGATIVE NEGATIVE Final    Comment: (NOTE) The Xpert Xpress SARS-CoV-2/FLU/RSV plus assay is intended as an aid in the diagnosis of influenza from Nasopharyngeal swab specimens and should not be used as a sole basis for treatment. Nasal washings and aspirates are unacceptable for Xpert Xpress SARS-CoV-2/FLU/RSV testing.  Fact Sheet for Patients: EntrepreneurPulse.com.au  Fact Sheet for Healthcare Providers: IncredibleEmployment.be  This test is not yet approved or cleared by the [place] FDA and has been authorized for detection and/or diagnosis of SARS-CoV-2 by FDA under an Emergency Use Authorization (EUA). This  EUA will remain in effect (meaning this test can be used) for the duration of the COVID-19 declaration under Section 564(b)(1) of the Act, 21 U.S.C. section 360bbb-3(b)(1), unless the authorization is terminated or revoked.     Resp Syncytial Virus by PCR NEGATIVE NEGATIVE Final    Comment: (NOTE) Fact Sheet for Patients: EntrepreneurPulse.com.au  Fact Sheet for Healthcare Providers: IncredibleEmployment.be  This test is not yet approved or cleared by the [place] FDA and has been authorized for detection and/or diagnosis of SARS-CoV-2 by FDA under an Emergency Use Authorization (EUA). This EUA will remain in effect (meaning this test can be used) for the duration of the COVID-19 declaration under Section 564(b)(1) of the Act, 21 U.S.C. section 360bbb-3(b)(1), unless the authorization is terminated or  revoked.  Performed at [facility], [address], [place], [place] [address]          Radiology Studies: US Venous Img Lower Bilateral (DVT)  Result Date: 05/24/2022 CLINICAL DATA:  Bilateral lower extremity edema. EXAM: BILATERAL LOWER EXTREMITY VENOUS DOPPLER ULTRASOUND TECHNIQUE: Gray-scale sonography with graded compression, as well as color Doppler and duplex ultrasound were performed to evaluate the lower extremity deep venous systems from the level of the common femoral vein and including the common femoral, femoral, profunda femoral, popliteal and calf veins including the posterior tibial, peroneal and gastrocnemius veins when visible. The superficial great saphenous vein was also interrogated. Spectral Doppler was utilized to evaluate flow at rest and with distal augmentation maneuvers in the common femoral, femoral and popliteal veins. COMPARISON:  None Available. FINDINGS: RIGHT LOWER EXTREMITY Common Femoral Vein: No evidence of thrombus. Normal compressibility, respiratory phasicity and response to augmentation. Saphenofemoral  Junction: No evidence of thrombus. Normal compressibility and flow on color Doppler imaging. Profunda Femoral Vein: No evidence of thrombus. Normal compressibility and flow on color Doppler imaging. Femoral Vein: No evidence of thrombus. Normal compressibility, respiratory phasicity and response to augmentation. Popliteal Vein: No evidence of thrombus. Normal compressibility, respiratory phasicity and response to augmentation. Calf Veins: No evidence of thrombus. Normal compressibility and flow on color Doppler imaging. Superficial Great Saphenous Vein: No evidence of thrombus. Normal compressibility. Venous Reflux:  None. Other Findings:  None. LEFT LOWER EXTREMITY Common Femoral Vein: No evidence of thrombus. Normal compressibility, respiratory phasicity and response to augmentation. Saphenofemoral Junction: No evidence of thrombus. Normal compressibility and flow on color Doppler imaging. Profunda Femoral Vein: No evidence of thrombus. Normal compressibility and flow on color Doppler imaging. Femoral Vein: No evidence of thrombus. Normal compressibility, respiratory phasicity and response to augmentation. Popliteal Vein: No evidence of thrombus. Normal compressibility, respiratory phasicity and response to augmentation. Calf Veins: No evidence of thrombus. Normal compressibility and flow on color Doppler imaging. Superficial Great Saphenous Vein: No evidence of thrombus. Normal compressibility. Venous Reflux:  None. Other Findings:  None. IMPRESSION: No evidence of deep venous thrombosis in either lower extremity. Electronically Signed   By: Marijo Conception M.D.   On: 05/24/2022 14:56   ECHOCARDIOGRAM COMPLETE  Result Date: 05/24/2022    ECHOCARDIOGRAM REPORT   Patient Name:   Carly [name] [facility] Date of Exam: 05/24/2022 Medical Rec #:  [id number]      Height:       61.5 in Accession #:    [id number]     Weight:       143.1 lb Date of Birth:  03-Mar-1919      BSA:          1.648 m Patient Age:    89 years      BP:            152/51 mmHg Patient Gender: F              HR:           71 bpm. Exam Location:  [facility] Procedure: 2D Echo, 3D Echo, Color Doppler and Strain Analysis Indications:    Dyspnea R06.00  History:        Patient has no prior history of Echocardiogram examinations.                 CAD, Carotid Disease, Signs/Symptoms:Dyspnea and Shortness of                 Breath; Risk Factors:Hypertension, Dyslipidemia  and Non-Smoker.  Sonographer:    Greer Pickerel Referring Phys: [id number] COURAGE EMOKPAE  Sonographer Comments: Image acquisition challenging due to respiratory motion. Global longitudinal strain was attempted. IMPRESSIONS  1. Left ventricular ejection fraction, by estimation, is 65 to 70%. The left ventricle has normal function. The left ventricle has no regional wall motion abnormalities. Left ventricular diastolic parameters are consistent with Grade II diastolic dysfunction (pseudonormalization).  2. Right ventricular systolic function is normal. The right ventricular size is normal. The estimated right ventricular systolic pressure is 06.2 mmHg.  3. Left atrial size was mildly dilated.  4. Possible small left pleural effusion.  5. The mitral valve is grossly normal. Mild to moderate mitral valve regurgitation.  6. The aortic valve is tricuspid. Aortic valve regurgitation is moderate. No aortic stenosis is present.  7. Aortic dilatation noted. There is mild dilatation of the ascending aorta, measuring 39 mm.  8. The inferior vena cava is normal in size with <50% respiratory variability, suggesting right atrial pressure of 8 mmHg. Comparison(s): No prior Echocardiogram. FINDINGS  Left Ventricle: Left ventricular ejection fraction, by estimation, is 65 to 70%. The left ventricle has normal function. The left ventricle has no regional wall motion abnormalities. The left ventricular internal cavity size was normal in size. There is  borderline left ventricular hypertrophy. Left ventricular diastolic parameters are  consistent with Grade II diastolic dysfunction (pseudonormalization). Right Ventricle: The right ventricular size is normal. No increase in right ventricular wall thickness. Right ventricular systolic function is normal. The tricuspid regurgitant velocity is 3.15 m/s, and with an assumed right atrial pressure of 8 mmHg, the estimated right ventricular systolic pressure is 69.4 mmHg. Left Atrium: Left atrial size was mildly dilated. Right Atrium: Right atrial size was normal in size. Pericardium: Possible small left pleural effusion. There is no evidence of pericardial effusion. Presence of epicardial fat layer. Mitral Valve: The mitral valve is grossly normal. Mild to moderate mitral valve regurgitation. Tricuspid Valve: The tricuspid valve is grossly normal. Tricuspid valve regurgitation is mild. Aortic Valve: The aortic valve is tricuspid. There is mild aortic valve annular calcification. Aortic valve regurgitation is moderate. Aortic regurgitation PHT measures 232 msec. No aortic stenosis is present. Pulmonic Valve: The pulmonic valve was grossly normal. Pulmonic valve regurgitation is trivial. Aorta: The aortic root is normal in size and structure and aortic dilatation noted. There is mild dilatation of the ascending aorta, measuring 39 mm. Venous: The inferior vena cava is normal in size with less than 50% respiratory variability, suggesting right atrial pressure of 8 mmHg. IAS/Shunts: No atrial level shunt detected by color flow Doppler.  LEFT VENTRICLE PLAX 2D LVIDd:         3.90 cm   Diastology LVIDs:         2.50 cm   LV e' medial:    5.07 cm/s LV PW:         1.00 cm   LV E/e' medial:  25.8 LV IVS:        1.00 cm   LV e' lateral:   4.51 cm/s LVOT diam:     1.60 cm   LV E/e' lateral: 29.0 LV SV:         54 LV SV Index:   33 LVOT Area:     2.01 cm                           3D Volume EF:  3D EF:        49 %                          LV EDV:       116 ml                          LV ESV:        60 ml                          LV SV:        56 ml RIGHT VENTRICLE RV S prime:     12.30 cm/s TAPSE (M-mode): 1.7 cm LEFT ATRIUM             Index        RIGHT ATRIUM           Index LA diam:        3.00 cm 1.82 cm/m   RA Area:     12.10 cm LA Vol (A2C):   51.0 ml 30.94 ml/m  RA Volume:   26.80 ml  16.26 ml/m LA Vol (A4C):   56.6 ml 34.34 ml/m LA Biplane Vol: 54.3 ml 32.95 ml/m  AORTIC VALVE             PULMONIC VALVE LVOT Vmax:   108.00 cm/s PR End Diast Vel: 5.11 msec LVOT Vmean:  78.500 cm/s LVOT VTI:    0.270 m AI PHT:      232 msec  AORTA Ao Root diam: 3.30 cm Ao Asc diam:  3.90 cm MITRAL VALVE                TRICUSPID VALVE MV Area (PHT): 3.08 cm     TR Peak grad:   39.7 mmHg MV Decel Time: 246 msec     TR Vmax:        315.00 cm/s MR Peak grad: 111.7 mmHg MR Vmax:      528.33 cm/s   SHUNTS MV E velocity: 131.00 cm/s  Systemic VTI:  0.27 m MV A velocity: 130.00 cm/s  Systemic Diam: 1.60 cm MV E/A ratio:  1.01 Rozann Lesches MD Electronically signed by Rozann Lesches MD Signature Date/Time: 05/24/2022/12:47:03 PM    Final         Scheduled Meds:  calcium carbonate  1 tablet Oral Daily   furosemide  40 mg Intravenous BID   metoprolol succinate  50 mg Oral BID   sodium bicarbonate  650 mg Oral BID   sodium chloride flush  3 mL Intravenous Q12H   Continuous Infusions:  ferric gluconate (FERRLECIT) IVPB 250 mg (05/26/22 1124)     LOS: 2 days    Time spent: 35 minutes    Kenniya Westrich Darleen Crocker, DO [facility]  If 7PM-7AM, please contact night-coverage [email or web address] 05/26/2022, 11:51 AM

## 2022-05-26 NOTE — TOC Initial Note (Signed)
Transition of Care [facility]) - Initial/Assessment Note    Patient Details  Name: Carly [name] MRN: [id number] Date of Birth: 12-12-1918  Transition of Care [facility]) CM/SW Contact:    Salome Arnt, LCSW Phone Number: 05/26/2022, 1:16 PM  Clinical Narrative:  Pt admitted due to acute hypoxic respiratory failure. Assessment completed with pt's daughter, Windell Hummingbird. Althea reports she has been staying with pt and pt also has paid caregivers. MD discussed goals of care this morning and family has agreed to home hospice. Althea requests [facility]. Referral made to [facility]. Family report pt does not need hospital bed, but will need home O2. Keka to arrange.                    Expected Discharge Plan: Home w Hospice Care Barriers to Discharge: Continued Medical Work up   Patient Goals and CMS Choice Patient states their goals for this hospitalization and ongoing recovery are:: return home with hospice   Choice offered to / list presented to : Adult [place] ownership interest in [facility].provided to::  (n/a)    Expected Discharge Plan and Services In-house Referral: Clinical Social Work   Post Acute Care Choice: Hospice Living arrangements for the past 2 months: [place] Date [place]: 05/26/22 Time [place]: 42 Representative spoke with at [place]: Marchia Meiers  Prior Living Arrangements/Services Living arrangements for the past 2 months: [place] with:: Self Patient language and need for interpreter reviewed:: Yes Do you feel safe going back to the place where you live?: Yes      Need for Family Participation in Patient Care: Yes (Comment) Care giver support system in place?: Yes (comment) Current home services: DME (walker, wheelchair, BSC) Criminal Activity/Legal Involvement Pertinent to Current Situation/Hospitalization: No - Comment as  needed  Activities of Daily Living Home Assistive Devices/Equipment: Wheelchair, Environmental consultant (specify type) ADL Screening (condition at time of admission) Patient's cognitive ability adequate to safely complete daily activities?: Yes Is the patient deaf or have difficulty hearing?: Yes Does the patient have difficulty seeing, even when wearing glasses/contacts?: No Does the patient have difficulty concentrating, remembering, or making decisions?: Yes Patient able to express need for assistance with ADLs?: Yes Does the patient have difficulty dressing or bathing?: Yes Independently performs ADLs?: No Communication: Independent Dressing (OT): Needs assistance Is this a change from baseline?: Pre-admission baseline Grooming: Needs assistance Is this a change from baseline?: Pre-admission baseline Feeding: Needs assistance Is this a change from baseline?: Pre-admission baseline Bathing: Needs assistance Is this a change from baseline?: Pre-admission baseline Toileting: Needs assistance Is this a change from baseline?: Pre-admission baseline In/Out Bed: Needs assistance Is this a change from baseline?: Pre-admission baseline Walks in Home: Needs assistance Is this a change from baseline?: Pre-admission baseline Does the patient have difficulty walking or climbing stairs?: Yes Weakness of Legs: Both Weakness of Arms/Hands: Both  Permission Sought/Granted                  Emotional Assessment         Alcohol / Substance Use: Not Applicable Psych Involvement: No (comment)  Admission diagnosis:  Acute respiratory failure with hypoxia ([place]) [J96.01] Acute CHF ([place]) [I50.9] Patient Active Problem List   Diagnosis Date Noted   Acute  CHF ([place]) 05/24/2022   Acute respiratory failure with hypoxia ([place]) 05/23/2022   Hypertensive heart and kidney disease with HF and CKD stage V ([place]) 05/23/2022   CKD (chronic kidney disease), stage V ([place]) 05/23/2022   HTN (hypertension) 05/23/2022    Osteoarthritis of multiple joints 05/24/2021   Muscle weakness 05/10/2021   Dysphagia 12/12/2017   Pain in the chest    Chest pain 11/03/2012   Chronic kidney disease, stage III (moderate) ([place]) 11/03/2012   Dizziness 04/28/2011   Carotid arterial disease ([place]) 06/08/2009   HLD (hyperlipidemia) 10/02/2008   HYPERTENSION, BENIGN 10/02/2008   CAD, NATIVE VESSEL 10/02/2008   CLOSED FRACTURE OF LATERAL MALLEOLUS 01/06/2008   PCP:  Celene Squibb, MD Pharmacy:   [facility], [place] [address] [place] [address] Phone: [phone] Fax: [phone]     Social Determinants of Health (SDOH) Social History: SDOH Screenings   Food Insecurity: No Food Insecurity (05/23/2022)  Housing: Low Risk  (05/23/2022)  Transportation Needs: No Transportation Needs (05/23/2022)  Utilities: Not At Risk (05/23/2022)  Tobacco Use: Low Risk  (05/24/2022)   SDOH Interventions:     Readmission Risk Interventions     No data to display

## 2022-05-26 NOTE — IPAL (Signed)
  Interdisciplinary Goals of Care Family Meeting   Date carried out: 05/26/2022  Location of the meeting: Bedside  Member's involved: Physician and Family Member or next of kin  Durable Power of Attorney or Loss adjuster, chartered: Patient and daughters    Discussion: We discussed goals of care for [employer] .  It was made clear that she was not a candidate for dialysis and that her renal function will continue to worsen and that her prognosis is days to weeks.  We discussed the option of hospice at a facility versus at home and patient is requesting home hospice at this time and daughters at bedside are agreeable, but anxious about the prospect of having to care for her at home.  They understand that she may transition to hospice facility at a later date if needed.  Code status:   Code Status: DNR   Disposition: Home with Hospice  Time spent for the meeting: 30 minutes.    Cashmere Harmes Darleen Crocker, DO  05/26/2022, 11:52 AM

## 2022-05-27 DIAGNOSIS — J9601 Acute respiratory failure with hypoxia: Secondary | ICD-10-CM | POA: Diagnosis not present

## 2022-05-27 LAB — CBC
HCT: 27.1 % — ABNORMAL LOW (ref 36.0–46.0)
Hemoglobin: 8.6 g/dL — ABNORMAL LOW (ref 12.0–15.0)
MCH: 31 pg (ref 26.0–34.0)
MCHC: 31.7 g/dL (ref 30.0–36.0)
MCV: 97.8 fL (ref 80.0–100.0)
Platelets: 186 10*3/uL (ref 150–400)
RBC: 2.77 MIL/uL — ABNORMAL LOW (ref 3.87–5.11)
RDW: 14.8 % (ref 11.5–15.5)
WBC: 8.9 10*3/uL (ref 4.0–10.5)
nRBC: 0 % (ref 0.0–0.2)

## 2022-05-27 LAB — BASIC METABOLIC PANEL
Anion gap: 10 (ref 5–15)
BUN: 96 mg/dL — ABNORMAL HIGH (ref 8–23)
CO2: 25 mmol/L (ref 22–32)
Calcium: 7.9 mg/dL — ABNORMAL LOW (ref 8.9–10.3)
Chloride: 105 mmol/L (ref 98–111)
Creatinine, Ser: 4.24 mg/dL — ABNORMAL HIGH (ref 0.44–1.00)
GFR, Estimated: 9 mL/min — ABNORMAL LOW (ref >60)
Glucose, Bld: 96 mg/dL (ref 70–99)
Potassium: 4 mmol/L (ref 3.5–5.1)
Sodium: 140 mmol/L (ref 135–145)

## 2022-05-27 LAB — MAGNESIUM: Magnesium: 2.1 mg/dL (ref 1.7–2.4)

## 2022-05-27 NOTE — Progress Notes (Signed)
PROGRESS NOTE    Carly [name]  VQM:[id number] DOB: 1918-10-16 DOA: 05/23/2022 PCP: Celene Squibb, MD   Brief Narrative:    Carly [name]  is a 89 y.o. female past medical history relevant for CKD 5,  history of transitional cell carcinoma of the right kidney and she is status post right nephro ureterectomy in May 2027, -Baseline creatinine from [facility] from Dr. Toya Smothers office is 3.2-3.4, -Creatinine up to 3.73 today,,-Bicarb is 20 , history of hypertension, chronic anemia of CKD ( hemoglobin currently above 10 which is close to recent baseline), h/o CAD (s/p PTCA/Stent to the LAD and PTCA to ramus in 2003)/H/o PAD with known carotid artery stenosis.  She presented to the ED with sudden onset shortness of breath and was admitted for acute hypoxemic respiratory failure in the setting of volume overload.Appreciate nephrology recommendations for continued IV diuresis.  Noted to have worsening anemia that is stable and with some iron deficiency for which she will receive some IV iron.  She continues on IV Lasix nephrology direction and can be transitioned hopefully to oral Lasix in the next 1-2 days.  Discussion had with patient and family regarding home hospice on 2/16 and they are agreeable to this plan.  Assessment & Plan:   Principal Problem:   Acute respiratory failure with hypoxia (HCC) Active Problems:   Hypertensive heart and kidney disease with HF and CKD stage V (HCC)   CKD (chronic kidney disease), stage V (HCC)   HTN (hypertension)   HYPERTENSION, BENIGN   CAD, NATIVE VESSEL   Carotid arterial disease (HCC)   Dysphagia   Acute CHF (HCC)  Assessment and Plan:   1) acute hypoxic respiratory failure-likely secondary to CHF exacerbation with noted volume overload as patient has been off of Lasix -Elevated BNP noted at 877 (baseline is not available) -Chest x-ray with tiny pleural effusions possible CHF findings -Chronic leg edema noted -IV Lasix as ordered per nephrology  recommendations, likely can transition to oral Lasix in a.m. -Daily weight and fluid input and output monitoring -Venous Dopplers negative for DVT -COVID, flu and RSV negative -No productive cough or leukocytosis, no fevers or chills Initial troponin is 10 repeat is 11, EKG sinus without acute changes -In the ED patient required up to 4 L of oxygen via nasal cannula -Currently requiring 2 L of oxygen via nasal cannula   2) CKD 5--- patient has history of transitional cell carcinoma of the right kidney and she is status post right nephro ureterectomy in May 2027 -Baseline creatinine from [facility] from Dr. Toya Smothers office is 3.2-3.4 -Continue sodium bicarb supplements -Continue trending up, renal function initially worsened with aggressive IV diuresis--appears to be stabilizing at this time -As per patient and family she was previously told she is not a candidate for hemodialysis -renally adjust medications, avoid nephrotoxic agents / dehydration  / hypotension -Nephrology consult appreciated -Anticipate discharge home on 05/28/2022 with hospice and oral Lasix      3)Social/Ethics---Discussed with daughters Estill Batten and Windell Hummingbird and granddaughter Arts development officer -Patient is a DNR/DNI -Please see MOST form completed -Appreciate palliative consultation  -Plan is for possible discharge home on 05/28/2022 with hospice   4) acute on chronic anemia of CKD--- hemoglobin currently above 10 which is close to recent baseline -No bleeding concerns -Hemoglobin is currently stable, monitor CBC -FOBT pending -Noted to be iron deficient and started on supplementation  -Aranesp as ordered   5)CAD (s/p PTCA/Stent to the LAD and PTCA to ramus in 2003)/H/o  PAD with known carotid artery stenosis --- Currently chest pain-free. Initial troponin is 10 repeat is 11, EKG sinus without acute changes -Continue metoprolol -Hold simvastatin    DVT prophylaxis:SCDs Code Status: DNR Family Communication:  Daughters at bedside 2/16 Disposition Plan:  Status is: Inpatient Remains inpatient appropriate because: Need for IV medications.   Consultants:  Nephrology Palliative  Procedures:  None  Antimicrobials:  None   Subjective: -Grandson at bedside, daughter on the phone on speaker/FaceTime -Patient voiding okay -Unable to wean off oxygen -Oral intake is fair  Objective: Vitals:   05/26/22 1802 05/26/22 2021 05/27/22 0500 05/27/22 0626  BP: (!) 189/89 (!) 173/79  (!) 143/62  Pulse: 73 67  68  Resp:  17  16  Temp:  97.7 F (36.5 C)  98.5 F (36.9 C)  TempSrc:      SpO2: 97% 96%  95%  Weight:   61 kg   Height:        Intake/Output Summary (Last 24 hours) at 05/27/2022 1825 Last data filed at 05/27/2022 1700 Gross per 24 hour  Intake --  Output 1000 ml  Net -1000 ml   Filed Weights   05/25/22 0500 05/26/22 0500 05/27/22 0500  Weight: 65 kg 62.6 kg 61 kg   Physical Exam  Gen:- Awake Alert, in no acute distress  Nose- [place] 2L/min HEENT:- [place].AT, No sclera icterus Neck-Supple Neck,No JVD,.  Lungs-improving movement, no wheezing  CV- S1, S2 normal, RRR Abd-  +ve B.Sounds, Abd Soft, No tenderness,    Extremity/Skin:-Improving edema,   good pedal pulses  Psych-affect is appropriate, oriented x3 Neuro-generalized weakness no new focal deficits, no tremors  Data Reviewed: I have personally reviewed following labs and imaging studies  CBC: Recent Labs  Lab 05/23/22 1004 05/24/22 0443 05/25/22 0447 05/26/22 0416 05/27/22 0543  WBC 10.2 14.5* 9.4 9.2 8.9  HGB 10.2* 8.7* 7.8* 7.8* 8.6*  HCT 32.4* 27.2* 24.5* 25.5* 27.1*  MCV 98.2 97.5 99.2 99.2 97.8  PLT 206 174 153 158 470   Basic Metabolic Panel: Recent Labs  Lab 05/23/22 1004 05/24/22 0443 05/25/22 0447 05/25/22 1048 05/26/22 0416 05/27/22 0543  NA 140 141 138  --  139 140  K 4.6 4.7 4.3  --  4.4 4.0  CL 108 110 107  --  103 105  CO2 20* 20* 23  --  25 25  GLUCOSE 123* 129* 89  --  93 96  BUN 96*  94* 93*  --  96* 96*  CREATININE 3.73* 3.76* 4.00*  --  4.30* 4.24*  CALCIUM 8.5* 8.0* 7.6*  --  7.7* 7.9*  MG  --   --  2.1  --  2.1 2.1  PHOS  --   --   --  5.2*  --   --    GFR: Estimated Creatinine Clearance: 5.8 mL/min (A) (by C-G formula based on SCr of 4.24 mg/dL (H)). Liver Function Tests: Recent Labs  Lab 05/23/22 1004 05/24/22 0443  AST 20 16  ALT 15 12  ALKPHOS 47 35*  BILITOT 0.6 0.5  PROT 6.7 5.4*  ALBUMIN 3.6 2.8*   Anemia Panel: Recent Labs    05/25/22 1048  VITAMINB12 325  FOLATE 10.2  FERRITIN 28  TIBC 311  IRON 27*  RETICCTPCT 2.9   Sepsis Labs: No results for input(s): "PROCALCITON", "LATICACIDVEN" in the last 168 hours.  Recent Results (from the past 240 hour(s))  Resp panel by RT-PCR (RSV, Flu A&B, Covid) Anterior Nasal Swab  Status: None   Collection Time: 05/23/22 10:04 AM   Specimen: Anterior Nasal Swab  Result Value Ref Range Status   SARS Coronavirus 2 by RT PCR NEGATIVE NEGATIVE Final    Comment: (NOTE) SARS-CoV-2 target nucleic acids are NOT DETECTED.  The SARS-CoV-2 RNA is generally detectable in upper respiratory specimens during the acute phase of infection. The lowest concentration of SARS-CoV-2 viral copies this assay can detect is 138 copies/mL. A negative result does not preclude SARS-Cov-2 infection and should not be used as the sole basis for treatment or other patient management decisions. A negative result may occur with  improper specimen collection/handling, submission of specimen other than nasopharyngeal swab, presence of viral mutation(s) within the areas targeted by this assay, and inadequate number of viral copies(<138 copies/mL). A negative result must be combined with clinical observations, patient history, and epidemiological information. The expected result is Negative.  Fact Sheet for Patients:  EntrepreneurPulse.com.au  Fact Sheet for Healthcare Providers:   IncredibleEmployment.be  This test is no t yet approved or cleared by the [place] FDA and  has been authorized for detection and/or diagnosis of SARS-CoV-2 by FDA under an Emergency Use Authorization (EUA). This EUA will remain  in effect (meaning this test can be used) for the duration of the COVID-19 declaration under Section 564(b)(1) of the Act, 21 U.S.C.section 360bbb-3(b)(1), unless the authorization is terminated  or revoked sooner.       Influenza A by PCR NEGATIVE NEGATIVE Final   Influenza B by PCR NEGATIVE NEGATIVE Final    Comment: (NOTE) The Xpert Xpress SARS-CoV-2/FLU/RSV plus assay is intended as an aid in the diagnosis of influenza from Nasopharyngeal swab specimens and should not be used as a sole basis for treatment. Nasal washings and aspirates are unacceptable for Xpert Xpress SARS-CoV-2/FLU/RSV testing.  Fact Sheet for Patients: EntrepreneurPulse.com.au  Fact Sheet for Healthcare Providers: IncredibleEmployment.be  This test is not yet approved or cleared by the [place] FDA and has been authorized for detection and/or diagnosis of SARS-CoV-2 by FDA under an Emergency Use Authorization (EUA). This EUA will remain in effect (meaning this test can be used) for the duration of the COVID-19 declaration under Section 564(b)(1) of the Act, 21 U.S.C. section 360bbb-3(b)(1), unless the authorization is terminated or revoked.     Resp Syncytial Virus by PCR NEGATIVE NEGATIVE Final    Comment: (NOTE) Fact Sheet for Patients: EntrepreneurPulse.com.au  Fact Sheet for Healthcare Providers: IncredibleEmployment.be  This test is not yet approved or cleared by the [place] FDA and has been authorized for detection and/or diagnosis of SARS-CoV-2 by FDA under an Emergency Use Authorization (EUA). This EUA will remain in effect (meaning this test can be used) for  the duration of the COVID-19 declaration under Section 564(b)(1) of the Act, 21 U.S.C. section 360bbb-3(b)(1), unless the authorization is terminated or revoked.  Performed at [facility], [address], [place], [place] [address]     Radiology Studies: US RENAL  Result Date: 05/26/2022 CLINICAL DATA:  AKI, CKD. EXAM: RENAL / URINARY TRACT ULTRASOUND COMPLETE COMPARISON:  Renal ultrasound 07/16/2020 FINDINGS: Right Kidney: Surgically absent. There is no abnormal finding at the nephrectomy site. Left Kidney: Renal measurements: 10.0 cm x 5.0 cm x 4.7 cm = volume: 121 mL. There is mild cortical thinning and increased parenchymal echogenicity suggesting medical renal disease. There is no hydronephrosis. Bladder: Appears normal for degree of bladder distention. Other: A right pleural effusion is noted. IMPRESSION: 1. Status post right nephrectomy. 2. Mild  cortical thinning and increased echogenicity in the left kidney is suggestive of medical renal disease. No hydronephrosis. 3. Right pleural effusion. Electronically Signed   By: Valetta Mole M.D.   On: 05/26/2022 14:42     Scheduled Meds:  calcium carbonate  1 tablet Oral Daily   furosemide  40 mg Intravenous BID   metoprolol succinate  50 mg Oral BID   sodium bicarbonate  650 mg Oral BID   sodium chloride flush  3 mL Intravenous Q12H   Continuous Infusions:   LOS: 3 days    Roxan Hockey, MD [facility]  If 7PM-7AM, please contact night-coverage [email or web address] 05/27/2022, 6:25 PM

## 2022-05-27 NOTE — Progress Notes (Signed)
Patient slept through the night during this shift. Patient has no complaints of pain or discomfort. Upon assessment, I asked the patient if she would like to take off her knee high ted hose and patient replied, "no I will keep them on and my socks so my legs will stay warm". Patient daughter at bedside.

## 2022-05-28 DIAGNOSIS — J9601 Acute respiratory failure with hypoxia: Secondary | ICD-10-CM | POA: Diagnosis not present

## 2022-05-28 LAB — BASIC METABOLIC PANEL
Anion gap: 10 (ref 5–15)
BUN: 99 mg/dL — ABNORMAL HIGH (ref 8–23)
CO2: 26 mmol/L (ref 22–32)
Calcium: 8 mg/dL — ABNORMAL LOW (ref 8.9–10.3)
Chloride: 101 mmol/L (ref 98–111)
Creatinine, Ser: 4.21 mg/dL — ABNORMAL HIGH (ref 0.44–1.00)
GFR, Estimated: 9 mL/min — ABNORMAL LOW (ref >60)
Glucose, Bld: 143 mg/dL — ABNORMAL HIGH (ref 70–99)
Potassium: 4 mmol/L (ref 3.5–5.1)
Sodium: 137 mmol/L (ref 135–145)

## 2022-05-28 MED ORDER — AMLODIPINE BESYLATE 5 MG PO TABS
10.0000 mg | ORAL_TABLET | Freq: Every day | ORAL | Status: DC
  Administered 2022-05-28: 10 mg via ORAL
  Filled 2022-05-28: qty 2

## 2022-05-28 MED ORDER — ASPIRIN 81 MG PO TABS: 81.0000 mg | ORAL_TABLET | Freq: Every day | ORAL | 2 refills | Status: DC

## 2022-05-28 MED ORDER — CALCITRIOL 0.25 MCG PO CAPS: 0.2500 ug | ORAL_CAPSULE | ORAL | 2 refills | Status: DC

## 2022-05-28 MED ORDER — SODIUM BICARBONATE 650 MG PO TABS: 650.0000 mg | ORAL_TABLET | Freq: Two times a day (BID) | ORAL | 5 refills | Status: DC

## 2022-05-28 MED ORDER — METOPROLOL SUCCINATE ER 50 MG PO TB24: 50.0000 mg | ORAL_TABLET | Freq: Two times a day (BID) | ORAL | 3 refills | Status: DC

## 2022-05-28 MED ORDER — AMLODIPINE BESYLATE 10 MG PO TABS: 10.0000 mg | ORAL_TABLET | Freq: Every day | ORAL | 3 refills | Status: DC

## 2022-05-28 MED ORDER — HYDRALAZINE HCL 50 MG PO TABS: 50.0000 mg | ORAL_TABLET | Freq: Three times a day (TID) | ORAL | 3 refills | Status: DC

## 2022-05-28 MED ORDER — FUROSEMIDE 40 MG PO TABS: 40.0000 mg | ORAL_TABLET | Freq: Two times a day (BID) | ORAL | 3 refills | Status: DC

## 2022-05-28 MED ORDER — ACETAMINOPHEN 325 MG PO TABS: 650.0000 mg | ORAL_TABLET | Freq: Four times a day (QID) | ORAL | 1 refills | Status: DC | PRN

## 2022-05-28 MED ORDER — HYDRALAZINE HCL 20 MG/ML IJ SOLN
20.0000 mg | Freq: Once | INTRAMUSCULAR | Status: DC
  Filled 2022-05-28: qty 1

## 2022-05-28 NOTE — Discharge Summary (Signed)
Carly [name], is a 89 y.o. female  DOB Oct 15, 1918  MRN [id number].  Admission date:  05/23/2022  Admitting Physician  Rodena Goldmann, DO  Discharge Date:  05/28/2022   Primary MD  Celene Squibb, MD  Recommendations for primary care physician for things to follow:   1)Very low-salt diet advised--less than 2 gm of sodium per day 2)Weigh yourself daily, call if you gain more than 3 pounds in 1 day or more than 5 pounds in 1 week as your diuretic medications (Lasix/Furosemide) may need to be adjusted 3)Limit your Fluid  intake to no more than 60 ounces (1.8 Liters) per day 4)Avoid ibuprofen/Advil/Aleve/Motrin/Goody Powders/Naproxen/BC powders/Meloxicam/Diclofenac/Indomethacin and other Nonsteroidal anti-inflammatory medications as these will make you more likely to bleed and can cause stomach ulcers, can also cause Kidney problems.  5)Follow up with Hospice Team at home---  Admission Diagnosis  Acute respiratory failure with hypoxia ([place]) [J96.01] Acute CHF ([place]) [I50.9]   Discharge Diagnosis  Acute respiratory failure with hypoxia ([place]) [J96.01] Acute CHF ([place]) [I50.9]    Principal Problem:   Acute respiratory failure with hypoxia ([place]) Active Problems:   Hypertensive heart and kidney disease with HF and CKD stage V ([place])   CKD (chronic kidney disease), stage V ([place])   HTN (hypertension)   HYPERTENSION, BENIGN   CAD, NATIVE VESSEL   Carotid arterial disease ([place])   Dysphagia   Acute CHF ([place])      Past Medical History:  Diagnosis Date   CAD (coronary artery disease)    a. s/p PTCA/stent to LAD and PTCA of ramus in 2003. b. normal nuc 10/2012 with EF 87%   Carotid arterial disease ([place])    a. 07/2014 - duplex 42-70% RICA, 6-23% LICA.   CKD (chronic kidney disease), stage III (H[facility])    Cystocele    Dyslipidemia    Gout    HTN (hypertension)    Transitional cell carcinoma ([place])    a.  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   Vaginal wall prolapse     Past Surgical History:  Procedure Laterality Date   APPENDECTOMY     CORONARY ANGIOPLASTY WITH STENT PLACEMENT     NEPHRECTOMY     right     HPI  from the history and physical done on the day of admission:     Carly [name]  is a 89 y.o. female past medical history relevant for CKD 5,  history of transitional cell carcinoma of the right kidney and she is status post right nephro ureterectomy in May 2027, -Baseline creatinine from Care Everywhere from Dr. Toya Smothers office is 3.2-3.4, -Creatinine up to 3.73 today,,-Bicarb is 20 , history of hypertension, chronic anemia of CKD ( hemoglobin currently above 10 which is close to recent baseline), h/o CAD (s/p PTCA/Stent to the LAD and PTCA to ramus in 2003)/H/o PAD with known carotid artery stenosis   -Presents to the ED with sudden onset of shortness of breath this morning after getting up to  use the commode---The ED she is found to be hypoxic with O2 sats in the low 80s, requiring 4 L of oxygen via nasal cannula --COVID, flu and RSV negative -No productive cough or leukocytosis, no fevers or chills -- denies frank chest pains, Initial troponin is 10 repeat is 11, EKG sinus without acute changes -Patient with chronic leg swelling from CKD 5 and CHF -Elevated BNP noted at 877 (baseline is not available) -Chest x-ray with tiny pleural effusions possible CHF findings -Check venous Dopplers to rule out DVT   Initial troponin is 10 repeat is 11, EKG sinus without acute changes No fever  Or chills  - In ED blood pressure was very elevated at the beginning--- improved with interventions   No Nausea, Vomiting or Diarrhea   Additional history obtained from daughters [place] and [place] and granddaughter [facility] Course:   -Brief Narrative:    Carly [name]  is a 89 y.o. female past medical history  relevant for CKD 5,  history of transitional cell carcinoma of the right kidney and she is status post right nephro ureterectomy in May 2027, -Baseline creatinine from Care Everywhere from Dr. Toya Smothers office is 3.2-3.4, -Creatinine up to 3.73 today,,-Bicarb is 20 , history of hypertension, chronic anemia of CKD ( hemoglobin currently above 10 which is close to recent baseline), h/o CAD (s/p PTCA/Stent to the LAD and PTCA to ramus in 2003)/H/o PAD with known carotid artery stenosis.  She presented to the ED with sudden onset shortness of breath and was admitted for acute hypoxemic respiratory failure in the setting of volume overload.Appreciate nephrology recommendations for continued IV diuresis.  Noted to have worsening anemia that is stable and with some iron deficiency for which she will receive some IV iron.  She continues on IV Lasix nephrology direction and can be transitioned hopefully to oral Lasix in the next 1-2 days.  Discussion had with patient and family regarding home hospice on 2/16 and they are agreeable to this plan.  Assessment and Plan:  1)Acute Hypoxic Respiratory Failure- secondary to CHF exacerbation  -on admission had Elevated BNP noted at 877 (baseline is not available) -on admission Chest x-ray with tiny pleural effusions possible CHF findings -Chronic leg edema noted -Venous Dopplers negative for DVT -COVID, flu and RSV negative -No productive cough or leukocytosis, no fevers or chills Initial troponin is 10 repeat is 11, EKG sinus without acute changes -In the ED patient required up to 4 L of oxygen via nasal cannula --Dyspnea and hypoxia improved on IV Lasix , okay to discharge home on oral Lasix -Weight is down to 132 pounds from 143 pounds -Patient has been weaned down to 2 L of oxygen via nasal cannula -Okay to discharge home on 2 L of oxygen via nasal cannula   2) CKD 5--- patient has history of transitional cell carcinoma of the right kidney and she is status post  right nephro ureterectomy in May 2027 -Baseline creatinine from Care Everywhere from Dr. Toya Smothers office is 3.2-3.4 -Continue sodium bicarb supplements -As per patient and family she was previously told she is not a candidate for hemodialysis -renally adjust medications, avoid nephrotoxic agents / dehydration  / hypotension -Nephrology consult appreciated -Creatinine initially bumped up with diuresis but it is stabilized around 4.2 over the last couple of days -Discharging home with hospice and oral Lasix    3)Social/Ethics--- previously discussed with daughters Estill Batten and Windell Hummingbird and granddaughter Arts development officer -Patient is a DNR/DNI -Please see  MOST form completed -Appreciate palliative consultation  -Overall prognosis remains poor given advanced age and stage V CKD with volume overload status, grade 2 diastolic dysfunction CHF, hypoxia, CAD and anemia -Discharge home with hospice   4)Acute on chronic anemia of CKD---  -No bleeding concerns -Hgb currently above 8 -Noted to be iron deficient and started on supplementation  -Aranesp as ordered   5)CAD (s/p PTCA/Stent to the LAD and PTCA to ramus in 2003)/H/o PAD with known carotid artery stenosis --- Currently chest pain-free. Initial troponin is 10 repeat is 11, EKG sinus without acute changes -Continue metoprolol and aspirin -Okay to stop simvastatin  6)HFpEF--acute on chronic diastolic CHF exacerbation in the setting of poor renal function/CKD 5 -Echo from 05/24/2022 with EF of 65 to 67%, grade 2 diastolic dysfunction noted - mild to moderate mitral regurg noted -No aortic stenosis -Patient improved with IV Lasix -Okay to discharge home on oral Lasix, Toprol-XL   Consultants:  Nephrology Palliative  Discharge Condition: Stable  Follow UP   [facility]. Schedule an appointment as soon as possible for a visit.   Contact information: [address] [place]  [address] [phone]                Diet and Activity recommendation:  As advised  Discharge Instructions    Discharge Instructions     Call MD for:  difficulty breathing, headache or visual disturbances   Complete by: As directed    Call MD for:  persistant dizziness or light-headedness   Complete by: As directed    Call MD for:  persistant nausea and vomiting   Complete by: As directed    Call MD for:  temperature >100.4   Complete by: As directed    Diet - low sodium heart healthy   Complete by: As directed    Discharge instructions   Complete by: As directed    1)Very low-salt diet advised--less than 2 gm of sodium per day 2)Weigh yourself daily, call if you gain more than 3 pounds in 1 day or more than 5 pounds in 1 week as your diuretic medications (Lasix/Furosemide) may need to be adjusted 3)Limit your Fluid  intake to no more than 60 ounces (1.8 Liters) per day 4)Avoid ibuprofen/Advil/Aleve/Motrin/Goody Powders/Naproxen/BC powders/Meloxicam/Diclofenac/Indomethacin and other Nonsteroidal anti-inflammatory medications as these will make you more likely to bleed and can cause stomach ulcers, can also cause Kidney problems.  5)Follow up with Hospice Team at home---   Increase activity slowly   Complete by: As directed        Discharge Medications     Allergies as of 05/28/2022   No Known Allergies      Medication List     STOP taking these medications    calcium carbonate 500 MG chewable tablet Commonly known as: TUMS - dosed in mg elemental calcium   cloNIDine 0.1 MG tablet Commonly known as: CATAPRES   simvastatin 10 MG tablet Commonly known as: ZOCOR       TAKE these medications    acetaminophen 325 MG tablet Commonly known as: TYLENOL Take 2 tablets (650 mg total) by mouth every 6 (six) hours as needed for mild pain (or Fever >/= 101). What changed:  medication strength how much to take reasons to take this   amLODipine 10 MG  tablet Commonly known as: NORVASC Take 1 tablet (10 mg total) by mouth daily. What changed:  medication strength how much to take how  to take this when to take this additional instructions   aspirin 81 MG tablet Take 1 tablet (81 mg total) by mouth daily with breakfast. What changed: when to take this   calcitRIOL 0.25 MCG capsule Commonly known as: ROCALTROL Take 1 capsule (0.25 mcg total) by mouth every Monday, Wednesday, and Friday. Start taking on: May 29, 2022 What changed: when to take this   furosemide 40 MG tablet Commonly known as: Lasix Take 1 tablet (40 mg total) by mouth 2 (two) times daily. What changed:  medication strength when to take this   hydrALAZINE 50 MG tablet Commonly known as: APRESOLINE Take 1 tablet (50 mg total) by mouth 3 (three) times daily. What changed:  medication strength how much to take when to take this   metoprolol succinate 50 MG 24 hr tablet Commonly known as: TOPROL-XL Take 1 tablet (50 mg total) by mouth 2 (two) times daily. Take with or immediately following a meal. What changed:  when to take this additional instructions   nitroGLYCERIN 0.4 MG SL tablet Commonly known as: Nitrostat Place 1 tablet (0.4 mg total) under the tongue every 5 (five) minutes as needed for chest pain.   raloxifene 60 MG tablet Commonly known as: EVISTA Take 60 mg by mouth daily.   sodium bicarbonate 650 MG tablet Take 1 tablet (650 mg total) by mouth 2 (two) times daily.               Durable Medical Equipment  (From admission, onward)           Start     Ordered   05/28/22 0845  For home use only DME oxygen  Once       Comments:   Patient Saturations on Room Air at Rest = 85 %   Patient Saturations on Room Air while Ambulating = 84 %   Patient Saturations on 2 Liters of oxygen while Ambulating = 92 %       Patient needs continuous O2 at 2 L/min continuously via nasal cannula with humidifier, with gaseous portability  and conserving device  Question Answer Comment  Length of Need Lifetime   Mode or (Route) Nasal cannula   Liters per Minute 2   Frequency Continuous (stationary and portable oxygen unit needed)   Oxygen conserving device Yes   Oxygen delivery system Gas      05/28/22 0844   05/26/22 0809  For home use only DME Hospital bed  Once       Question Answer Comment  Length of Need Lifetime   Head must be elevated greater than: 30 degrees   Bed type Semi-electric      05/26/22 0808           Major procedures and Radiology Reports - PLEASE review detailed and final reports for all details, in brief -   US RENAL  Result Date: 05/26/2022 CLINICAL DATA:  AKI, CKD. EXAM: RENAL / URINARY TRACT ULTRASOUND COMPLETE COMPARISON:  Renal ultrasound 07/16/2020 FINDINGS: Right Kidney: Surgically absent. There is no abnormal finding at the nephrectomy site. Left Kidney: Renal measurements: 10.0 cm x 5.0 cm x 4.7 cm = volume: 121 mL. There is mild cortical thinning and increased parenchymal echogenicity suggesting medical renal disease. There is no hydronephrosis. Bladder: Appears normal for degree of bladder distention. Other: A right pleural effusion is noted. IMPRESSION: 1. Status post right nephrectomy. 2. Mild cortical thinning and increased echogenicity in the left kidney is suggestive of medical renal disease. No hydronephrosis.  3. Right pleural effusion. Electronically Signed   By: Valetta Mole M.D.   On: 05/26/2022 14:42   US Venous Img Lower Bilateral (DVT)  Result Date: 05/24/2022 CLINICAL DATA:  Bilateral lower extremity edema. EXAM: BILATERAL LOWER EXTREMITY VENOUS DOPPLER ULTRASOUND TECHNIQUE: Gray-scale sonography with graded compression, as well as color Doppler and duplex ultrasound were performed to evaluate the lower extremity deep venous systems from the level of the common femoral vein and including the common femoral, femoral, profunda femoral, popliteal and calf veins including the  posterior tibial, peroneal and gastrocnemius veins when visible. The superficial great saphenous vein was also interrogated. Spectral Doppler was utilized to evaluate flow at rest and with distal augmentation maneuvers in the common femoral, femoral and popliteal veins. COMPARISON:  None Available. FINDINGS: RIGHT LOWER EXTREMITY Common Femoral Vein: No evidence of thrombus. Normal compressibility, respiratory phasicity and response to augmentation. Saphenofemoral Junction: No evidence of thrombus. Normal compressibility and flow on color Doppler imaging. Profunda Femoral Vein: No evidence of thrombus. Normal compressibility and flow on color Doppler imaging. Femoral Vein: No evidence of thrombus. Normal compressibility, respiratory phasicity and response to augmentation. Popliteal Vein: No evidence of thrombus. Normal compressibility, respiratory phasicity and response to augmentation. Calf Veins: No evidence of thrombus. Normal compressibility and flow on color Doppler imaging. Superficial Great Saphenous Vein: No evidence of thrombus. Normal compressibility. Venous Reflux:  None. Other Findings:  None. LEFT LOWER EXTREMITY Common Femoral Vein: No evidence of thrombus. Normal compressibility, respiratory phasicity and response to augmentation. Saphenofemoral Junction: No evidence of thrombus. Normal compressibility and flow on color Doppler imaging. Profunda Femoral Vein: No evidence of thrombus. Normal compressibility and flow on color Doppler imaging. Femoral Vein: No evidence of thrombus. Normal compressibility, respiratory phasicity and response to augmentation. Popliteal Vein: No evidence of thrombus. Normal compressibility, respiratory phasicity and response to augmentation. Calf Veins: No evidence of thrombus. Normal compressibility and flow on color Doppler imaging. Superficial Great Saphenous Vein: No evidence of thrombus. Normal compressibility. Venous Reflux:  None. Other Findings:  None. IMPRESSION: No  evidence of deep venous thrombosis in either lower extremity. Electronically Signed   By: Marijo Conception M.D.   On: 05/24/2022 14:56   ECHOCARDIOGRAM COMPLETE  Result Date: 05/24/2022    ECHOCARDIOGRAM REPORT   Patient Name:   Carly [name] [facility] Date of Exam: 05/24/2022 Medical Rec #:  [id number]      Height:       61.5 in Accession #:    [id number]     Weight:       143.1 lb Date of Birth:  November 03, 1918      BSA:          1.648 m Patient Age:    89 years      BP:           152/51 mmHg Patient Gender: F              HR:           71 bpm. Exam Location:  [facility] Procedure: 2D Echo, 3D Echo, Color Doppler and Strain Analysis Indications:    Dyspnea R06.00  History:        Patient has no prior history of Echocardiogram examinations.                 CAD, Carotid Disease, Signs/Symptoms:Dyspnea and Shortness of                 Breath; Risk Factors:Hypertension, Dyslipidemia and Non-Smoker.  Sonographer:  Greer Pickerel Referring Phys: [id number] Caty Tessler  Sonographer Comments: Image acquisition challenging due to respiratory motion. Global longitudinal strain was attempted. IMPRESSIONS  1. Left ventricular ejection fraction, by estimation, is 65 to 70%. The left ventricle has normal function. The left ventricle has no regional wall motion abnormalities. Left ventricular diastolic parameters are consistent with Grade II diastolic dysfunction (pseudonormalization).  2. Right ventricular systolic function is normal. The right ventricular size is normal. The estimated right ventricular systolic pressure is 40.0 mmHg.  3. Left atrial size was mildly dilated.  4. Possible small left pleural effusion.  5. The mitral valve is grossly normal. Mild to moderate mitral valve regurgitation.  6. The aortic valve is tricuspid. Aortic valve regurgitation is moderate. No aortic stenosis is present.  7. Aortic dilatation noted. There is mild dilatation of the ascending aorta, measuring 39 mm.  8. The inferior vena cava is normal in  size with <50% respiratory variability, suggesting right atrial pressure of 8 mmHg. Comparison(s): No prior Echocardiogram. FINDINGS  Left Ventricle: Left ventricular ejection fraction, by estimation, is 65 to 70%. The left ventricle has normal function. The left ventricle has no regional wall motion abnormalities. The left ventricular internal cavity size was normal in size. There is  borderline left ventricular hypertrophy. Left ventricular diastolic parameters are consistent with Grade II diastolic dysfunction (pseudonormalization). Right Ventricle: The right ventricular size is normal. No increase in right ventricular wall thickness. Right ventricular systolic function is normal. The tricuspid regurgitant velocity is 3.15 m/s, and with an assumed right atrial pressure of 8 mmHg, the estimated right ventricular systolic pressure is 86.7 mmHg. Left Atrium: Left atrial size was mildly dilated. Right Atrium: Right atrial size was normal in size. Pericardium: Possible small left pleural effusion. There is no evidence of pericardial effusion. Presence of epicardial fat layer. Mitral Valve: The mitral valve is grossly normal. Mild to moderate mitral valve regurgitation. Tricuspid Valve: The tricuspid valve is grossly normal. Tricuspid valve regurgitation is mild. Aortic Valve: The aortic valve is tricuspid. There is mild aortic valve annular calcification. Aortic valve regurgitation is moderate. Aortic regurgitation PHT measures 232 msec. No aortic stenosis is present. Pulmonic Valve: The pulmonic valve was grossly normal. Pulmonic valve regurgitation is trivial. Aorta: The aortic root is normal in size and structure and aortic dilatation noted. There is mild dilatation of the ascending aorta, measuring 39 mm. Venous: The inferior vena cava is normal in size with less than 50% respiratory variability, suggesting right atrial pressure of 8 mmHg. IAS/Shunts: No atrial level shunt detected by color flow Doppler.  LEFT  VENTRICLE PLAX 2D LVIDd:         3.90 cm   Diastology LVIDs:         2.50 cm   LV e' medial:    5.07 cm/s LV PW:         1.00 cm   LV E/e' medial:  25.8 LV IVS:        1.00 cm   LV e' lateral:   4.51 cm/s LVOT diam:     1.60 cm   LV E/e' lateral: 29.0 LV SV:         54 LV SV Index:   33 LVOT Area:     2.01 cm                           3D Volume EF:  3D EF:        49 %                          LV EDV:       116 ml                          LV ESV:       60 ml                          LV SV:        56 ml RIGHT VENTRICLE RV S prime:     12.30 cm/s TAPSE (M-mode): 1.7 cm LEFT ATRIUM             Index        RIGHT ATRIUM           Index LA diam:        3.00 cm 1.82 cm/m   RA Area:     12.10 cm LA Vol (A2C):   51.0 ml 30.94 ml/m  RA Volume:   26.80 ml  16.26 ml/m LA Vol (A4C):   56.6 ml 34.34 ml/m LA Biplane Vol: 54.3 ml 32.95 ml/m  AORTIC VALVE             PULMONIC VALVE LVOT Vmax:   108.00 cm/s PR End Diast Vel: 5.11 msec LVOT Vmean:  78.500 cm/s LVOT VTI:    0.270 m AI PHT:      232 msec  AORTA Ao Root diam: 3.30 cm Ao Asc diam:  3.90 cm MITRAL VALVE                TRICUSPID VALVE MV Area (PHT): 3.08 cm     TR Peak grad:   39.7 mmHg MV Decel Time: 246 msec     TR Vmax:        315.00 cm/s MR Peak grad: 111.7 mmHg MR Vmax:      528.33 cm/s   SHUNTS MV E velocity: 131.00 cm/s  Systemic VTI:  0.27 m MV A velocity: 130.00 cm/s  Systemic Diam: 1.60 cm MV E/A ratio:  1.01 Rozann Lesches MD Electronically signed by Rozann Lesches MD Signature Date/Time: 05/24/2022/12:47:03 PM    Final    DG Chest Port 1 View  Result Date: 05/23/2022 CLINICAL DATA:  Shortness of breath. EXAM: PORTABLE CHEST 1 VIEW COMPARISON:  12/08/2017 FINDINGS: Lungs are hyperexpanded. There is some minimal atelectasis or infiltrate at the left base with tiny bilateral pleural effusions. Interstitial markings are diffusely coarsened with chronic features. Cardiopericardial silhouette is at upper limits of normal for size.  Telemetry leads overlie the chest. IMPRESSION: Hyperexpansion with minimal atelectasis or infiltrate at the left base and tiny bilateral pleural effusions. Electronically Signed   By: Misty Stanley M.D.   On: 05/23/2022 10:20    Micro Results   Recent Results (from the past 240 hour(s))  Resp panel by RT-PCR (RSV, Flu A&B, Covid) Anterior Nasal Swab     Status: None   Collection Time: 05/23/22 10:04 AM   Specimen: Anterior Nasal Swab  Result Value Ref Range Status   SARS Coronavirus 2 by RT PCR NEGATIVE NEGATIVE Final    Comment: (NOTE) SARS-CoV-2 target nucleic acids are NOT DETECTED.  The SARS-CoV-2 RNA is generally detectable in upper respiratory specimens during the acute phase of infection. The lowest concentration of SARS-CoV-2 viral  copies this assay can detect is 138 copies/mL. A negative result does not preclude SARS-Cov-2 infection and should not be used as the sole basis for treatment or other patient management decisions. A negative result may occur with  improper specimen collection/handling, submission of specimen other than nasopharyngeal swab, presence of viral mutation(s) within the areas targeted by this assay, and inadequate number of viral copies(<138 copies/mL). A negative result must be combined with clinical observations, patient history, and epidemiological information. The expected result is Negative.  Fact Sheet for Patients:  EntrepreneurPulse.com.au  Fact Sheet for Healthcare Providers:  IncredibleEmployment.be  This test is no t yet approved or cleared by the [place] FDA and  has been authorized for detection and/or diagnosis of SARS-CoV-2 by FDA under an Emergency Use Authorization (EUA). This EUA will remain  in effect (meaning this test can be used) for the duration of the COVID-19 declaration under Section 564(b)(1) of the Act, 21 U.S.C.section 360bbb-3(b)(1), unless the authorization is terminated  or  revoked sooner.       Influenza A by PCR NEGATIVE NEGATIVE Final   Influenza B by PCR NEGATIVE NEGATIVE Final    Comment: (NOTE) The Xpert Xpress SARS-CoV-2/FLU/RSV plus assay is intended as an aid in the diagnosis of influenza from Nasopharyngeal swab specimens and should not be used as a sole basis for treatment. Nasal washings and aspirates are unacceptable for Xpert Xpress SARS-CoV-2/FLU/RSV testing.  Fact Sheet for Patients: EntrepreneurPulse.com.au  Fact Sheet for Healthcare Providers: IncredibleEmployment.be  This test is not yet approved or cleared by the [place] FDA and has been authorized for detection and/or diagnosis of SARS-CoV-2 by FDA under an Emergency Use Authorization (EUA). This EUA will remain in effect (meaning this test can be used) for the duration of the COVID-19 declaration under Section 564(b)(1) of the Act, 21 U.S.C. section 360bbb-3(b)(1), unless the authorization is terminated or revoked.     Resp Syncytial Virus by PCR NEGATIVE NEGATIVE Final    Comment: (NOTE) Fact Sheet for Patients: EntrepreneurPulse.com.au  Fact Sheet for Healthcare Providers: IncredibleEmployment.be  This test is not yet approved or cleared by the [place] FDA and has been authorized for detection and/or diagnosis of SARS-CoV-2 by FDA under an Emergency Use Authorization (EUA). This EUA will remain in effect (meaning this test can be used) for the duration of the COVID-19 declaration under Section 564(b)(1) of the Act, 21 U.S.C. section 360bbb-3(b)(1), unless the authorization is terminated or revoked.  Performed at [facility], [address], [place], [place] [address]    Today   Subjective    Carly [name] today has no new complaints -Daughter at bedside -Voiding well -Eating okay -Eager to go home No chest pains no fevers          Patient has been seen and examined prior to  discharge   Objective   Blood pressure (!) 177/75, pulse 69, temperature 98 F (36.7 C), temperature source Oral, resp. rate 16, height 5' 1.5" (1.562 m), weight 60.3 kg, SpO2 97 %.   Intake/Output Summary (Last 24 hours) at 05/28/2022 1215 Last data filed at 05/28/2022 0500 Gross per 24 hour  Intake 480 ml  Output 1400 ml  Net -920 ml   Exam Gen:- Awake Alert, no acute distress , speaking in sentences HEENT:- [place].AT, No sclera icterus Nose- [place] 2L/min Neck-Supple Neck,No JVD,.  Lungs-improved air movement, no wheezing CV- S1, S2 normal, regular Abd-  +ve B.Sounds, Abd Soft, No tenderness,    Extremity/Skin:-Improved  leg  edema,   good pulses Psych-affect is appropriate, oriented x3 Neuro-generalized weakness, no new focal deficits, no tremors    Data Review   CBC w Diff:  Lab Results  Component Value Date   WBC 8.9 05/27/2022   HGB 8.6 (L) 05/27/2022   HGB 12.1 02/09/2020   HCT 27.1 (L) 05/27/2022   HCT 36.5 02/09/2020   PLT 186 05/27/2022   PLT 185 02/09/2020   LYMPHOPCT 25 08/16/2021   MONOPCT 8 08/16/2021   EOSPCT 6 08/16/2021   BASOPCT 1 08/16/2021   CMP:  Lab Results  Component Value Date   NA 137 05/28/2022   NA 141 05/28/2020   K 4.0 05/28/2022   CL 101 05/28/2022   CO2 26 05/28/2022   BUN 99 (H) 05/28/2022   BUN 40 (H) 05/28/2020   CREATININE 4.21 (H) 05/28/2022   CREATININE 1.72 (H) 05/20/2015   PROT 5.4 (L) 05/24/2022   ALBUMIN 2.8 (L) 05/24/2022   BILITOT 0.5 05/24/2022   ALKPHOS 35 (L) 05/24/2022   AST 16 05/24/2022   ALT 12 05/24/2022   Total Discharge time is about 33 minutes  Roxan Hockey M.D on 05/28/2022 at 12:15 PM  Go to [email or web address] -  for contact info  [facility] - Office  [phone]

## 2022-05-28 NOTE — TOC Transition Note (Signed)
Transition of Care [facility]) - CM/SW Discharge Note   Patient Details  Name: Carly [name] MRN: [id number] Date of Birth: December 19, 1918  [facility] CM/SW Contact:  Rodney Booze, LCSW Phone Number: 05/28/2022, 12:28 PM   Clinical Narrative:     CSW spoke with the daughter about the moms transition to hospice. The CSW also reached out to hospice to make sure they knew about the patients DC home. Hor[facility] Latino also set up O2 for the patient. The family will carry the patient home. At this time there are no TOC needs.   Final next level of care: Home w Hospice Care Barriers to Discharge: No Barriers Identified   Patient Goals and CMS Choice   Choice offered to / list presented to : Adult Children  Discharge Placement                    Name of family member notified: Aletha daughter Patient and family notified of of transfer: 05/28/22  Discharge Plan and Services Additional resources added to the After Visit Summary for   In-house Referral: Clinical Social Work   Post Acute Care Choice: Hospice          DME Arranged: Oxygen DME Agency: [facility] Date DME Agency Contacted: 05/28/22 Time DME Agency Contacted: [id number] Representative spoke with at DME Agency: [place]: [facility] Date [place]: 05/28/22 Time [place]: 1315 Representative spoke with at [place]: [place] ([place]) Interventions SDOH Screenings   Food Insecurity: No Food Insecurity (05/23/2022)  Housing: Low Risk  (05/23/2022)  Transportation Needs: No Transportation Needs (05/23/2022)  Utilities: Not At Risk (05/23/2022)  Tobacco Use: Low Risk  (05/24/2022)     Readmission Risk Interventions     No data to display

## 2022-05-28 NOTE — Discharge Instructions (Signed)
1)Very low-salt diet advised--less than 2 gm of sodium per day 2)Weigh yourself daily, call if you gain more than 3 pounds in 1 day or more than 5 pounds in 1 week as your diuretic medications (Lasix/Furosemide) may need to be adjusted 3)Limit your Fluid  intake to no more than 60 ounces (1.8 Liters) per day 4)Avoid ibuprofen/Advil/Aleve/Motrin/Goody Powders/Naproxen/BC powders/Meloxicam/Diclofenac/Indomethacin and other Nonsteroidal anti-inflammatory medications as these will make you more likely to bleed and can cause stomach ulcers, can also cause Kidney problems.  5)Follow up with Hospice Team at home---

## 2022-05-28 NOTE — Progress Notes (Signed)
    SATURATION QUALIFICATIONS: (This note is used to comply with regulatory documentation for home oxygen)   Patient Saturations on Room Air at Rest = 85 %   Patient Saturations on Room Air while Ambulating = 84 %   Patient Saturations on 2 Liters of oxygen while Ambulating = 92 %       Patient needs continuous O2 at 2 L/min continuously via nasal cannula with humidifier, with gaseous portability and conserving device    Roxan Hockey, MD

## 2022-06-09 DIAGNOSIS — E785 Hyperlipidemia, unspecified: Secondary | ICD-10-CM | POA: Diagnosis not present

## 2022-06-09 DIAGNOSIS — M6281 Muscle weakness (generalized): Secondary | ICD-10-CM | POA: Diagnosis not present

## 2022-06-09 DIAGNOSIS — I779 Disorder of arteries and arterioles, unspecified: Secondary | ICD-10-CM | POA: Diagnosis not present

## 2022-06-09 DIAGNOSIS — R079 Chest pain, unspecified: Secondary | ICD-10-CM | POA: Diagnosis not present

## 2022-06-09 DIAGNOSIS — I509 Heart failure, unspecified: Secondary | ICD-10-CM | POA: Diagnosis not present

## 2022-06-09 DIAGNOSIS — I519 Heart disease, unspecified: Secondary | ICD-10-CM | POA: Diagnosis not present

## 2022-06-09 DIAGNOSIS — J9601 Acute respiratory failure with hypoxia: Secondary | ICD-10-CM | POA: Diagnosis not present

## 2022-06-09 DIAGNOSIS — I10 Essential (primary) hypertension: Secondary | ICD-10-CM | POA: Diagnosis not present

## 2022-06-09 DIAGNOSIS — N185 Chronic kidney disease, stage 5: Secondary | ICD-10-CM | POA: Diagnosis not present

## 2022-06-09 DIAGNOSIS — R1310 Dysphagia, unspecified: Secondary | ICD-10-CM | POA: Diagnosis not present

## 2022-06-09 DIAGNOSIS — I2510 Atherosclerotic heart disease of native coronary artery without angina pectoris: Secondary | ICD-10-CM | POA: Diagnosis not present

## 2022-06-09 DIAGNOSIS — E46 Unspecified protein-calorie malnutrition: Secondary | ICD-10-CM | POA: Diagnosis not present

## 2022-06-09 DIAGNOSIS — M159 Polyosteoarthritis, unspecified: Secondary | ICD-10-CM | POA: Diagnosis not present

## 2022-07-10 DEATH — deceased
